# Patient Record
Sex: Female | Born: 1935 | Race: White | Hispanic: Yes | Smoking: Never smoker
Health system: Southern US, Community
[De-identification: ages and names within clinical notes are randomized; demographics above are authoritative.]

## PROBLEM LIST (undated history)

## (undated) DIAGNOSIS — L509 Urticaria, unspecified: Secondary | ICD-10-CM

## (undated) DIAGNOSIS — I10 Essential (primary) hypertension: Secondary | ICD-10-CM

## (undated) DIAGNOSIS — E7800 Pure hypercholesterolemia, unspecified: Secondary | ICD-10-CM

## (undated) HISTORY — DX: Urticaria, unspecified: L50.9

## (undated) HISTORY — PX: CATARACT EXTRACTION: SUR2

---

## 2007-11-09 ENCOUNTER — Ambulatory Visit (HOSPITAL_COMMUNITY): Admission: RE | Admit: 2007-11-09 | Discharge: 2007-11-09 | Payer: Self-pay | Admitting: Pediatrics

## 2009-02-13 ENCOUNTER — Other Ambulatory Visit: Admission: RE | Admit: 2009-02-13 | Discharge: 2009-02-13 | Payer: Self-pay | Admitting: Pediatrics

## 2010-02-23 ENCOUNTER — Ambulatory Visit (HOSPITAL_COMMUNITY)
Admission: RE | Admit: 2010-02-23 | Discharge: 2010-02-23 | Payer: Self-pay | Source: Home / Self Care | Admitting: Family Medicine

## 2011-06-07 ENCOUNTER — Other Ambulatory Visit (HOSPITAL_COMMUNITY): Payer: Self-pay | Admitting: Pediatrics

## 2011-06-07 DIAGNOSIS — Z139 Encounter for screening, unspecified: Secondary | ICD-10-CM

## 2011-06-14 ENCOUNTER — Ambulatory Visit (HOSPITAL_COMMUNITY)
Admission: RE | Admit: 2011-06-14 | Discharge: 2011-06-14 | Disposition: A | Discharge status: Home / Self Care | Payer: Medicare Other | Source: Ambulatory Visit | Attending: Pediatrics | Admitting: Pediatrics

## 2011-06-14 DIAGNOSIS — Z1231 Encounter for screening mammogram for malignant neoplasm of breast: Secondary | ICD-10-CM | POA: Insufficient documentation

## 2011-06-14 DIAGNOSIS — Z139 Encounter for screening, unspecified: Secondary | ICD-10-CM

## 2012-10-04 ENCOUNTER — Other Ambulatory Visit (HOSPITAL_COMMUNITY): Payer: Self-pay | Admitting: Internal Medicine

## 2012-10-04 DIAGNOSIS — Z09 Encounter for follow-up examination after completed treatment for conditions other than malignant neoplasm: Secondary | ICD-10-CM

## 2012-10-09 ENCOUNTER — Ambulatory Visit (HOSPITAL_COMMUNITY)
Admission: RE | Admit: 2012-10-09 | Discharge: 2012-10-09 | Disposition: A | Discharge status: Home / Self Care | Payer: Medicare Other | Source: Ambulatory Visit | Attending: Internal Medicine | Admitting: Internal Medicine

## 2012-10-09 DIAGNOSIS — Z1231 Encounter for screening mammogram for malignant neoplasm of breast: Secondary | ICD-10-CM | POA: Insufficient documentation

## 2012-10-09 DIAGNOSIS — Z09 Encounter for follow-up examination after completed treatment for conditions other than malignant neoplasm: Secondary | ICD-10-CM

## 2013-10-05 ENCOUNTER — Other Ambulatory Visit (HOSPITAL_COMMUNITY): Payer: Self-pay | Admitting: Internal Medicine

## 2013-10-05 DIAGNOSIS — Z1231 Encounter for screening mammogram for malignant neoplasm of breast: Secondary | ICD-10-CM

## 2013-10-10 ENCOUNTER — Ambulatory Visit (HOSPITAL_COMMUNITY)
Admission: RE | Admit: 2013-10-10 | Discharge: 2013-10-10 | Disposition: A | Discharge status: Home / Self Care | Payer: Medicare Other | Source: Ambulatory Visit | Attending: Internal Medicine | Admitting: Internal Medicine

## 2013-10-10 DIAGNOSIS — Z1231 Encounter for screening mammogram for malignant neoplasm of breast: Secondary | ICD-10-CM | POA: Insufficient documentation

## 2014-05-01 DIAGNOSIS — I10 Essential (primary) hypertension: Secondary | ICD-10-CM | POA: Diagnosis not present

## 2014-05-01 DIAGNOSIS — E785 Hyperlipidemia, unspecified: Secondary | ICD-10-CM | POA: Diagnosis not present

## 2014-07-31 DIAGNOSIS — H9190 Unspecified hearing loss, unspecified ear: Secondary | ICD-10-CM | POA: Diagnosis not present

## 2014-07-31 DIAGNOSIS — E785 Hyperlipidemia, unspecified: Secondary | ICD-10-CM | POA: Diagnosis not present

## 2014-07-31 DIAGNOSIS — I10 Essential (primary) hypertension: Secondary | ICD-10-CM | POA: Diagnosis not present

## 2014-07-31 DIAGNOSIS — R7309 Other abnormal glucose: Secondary | ICD-10-CM | POA: Diagnosis not present

## 2014-09-09 ENCOUNTER — Other Ambulatory Visit (HOSPITAL_COMMUNITY): Payer: Self-pay | Admitting: Internal Medicine

## 2014-09-09 DIAGNOSIS — Z1231 Encounter for screening mammogram for malignant neoplasm of breast: Secondary | ICD-10-CM

## 2014-09-18 ENCOUNTER — Ambulatory Visit (HOSPITAL_COMMUNITY)
Admission: RE | Admit: 2014-09-18 | Discharge: 2014-09-18 | Disposition: A | Discharge status: Home / Self Care | Payer: Medicare Other | Source: Ambulatory Visit | Attending: Internal Medicine | Admitting: Internal Medicine

## 2014-09-18 DIAGNOSIS — Z1231 Encounter for screening mammogram for malignant neoplasm of breast: Secondary | ICD-10-CM | POA: Insufficient documentation

## 2014-10-30 DIAGNOSIS — I10 Essential (primary) hypertension: Secondary | ICD-10-CM | POA: Diagnosis not present

## 2014-10-30 DIAGNOSIS — E785 Hyperlipidemia, unspecified: Secondary | ICD-10-CM | POA: Diagnosis not present

## 2015-02-05 DIAGNOSIS — E785 Hyperlipidemia, unspecified: Secondary | ICD-10-CM | POA: Diagnosis not present

## 2015-02-05 DIAGNOSIS — I10 Essential (primary) hypertension: Secondary | ICD-10-CM | POA: Diagnosis not present

## 2015-05-14 ENCOUNTER — Other Ambulatory Visit (HOSPITAL_COMMUNITY): Payer: Self-pay | Admitting: Internal Medicine

## 2015-05-14 DIAGNOSIS — Z780 Asymptomatic menopausal state: Secondary | ICD-10-CM

## 2015-05-14 DIAGNOSIS — E785 Hyperlipidemia, unspecified: Secondary | ICD-10-CM | POA: Diagnosis not present

## 2015-05-14 DIAGNOSIS — I10 Essential (primary) hypertension: Secondary | ICD-10-CM | POA: Diagnosis not present

## 2015-05-14 DIAGNOSIS — M810 Age-related osteoporosis without current pathological fracture: Secondary | ICD-10-CM

## 2015-05-16 ENCOUNTER — Ambulatory Visit (HOSPITAL_COMMUNITY)
Admission: RE | Admit: 2015-05-16 | Discharge: 2015-05-16 | Disposition: A | Discharge status: Home / Self Care | Payer: Commercial Managed Care - HMO | Source: Ambulatory Visit | Attending: Internal Medicine | Admitting: Internal Medicine

## 2015-05-16 DIAGNOSIS — M810 Age-related osteoporosis without current pathological fracture: Secondary | ICD-10-CM

## 2015-05-16 DIAGNOSIS — M8580 Other specified disorders of bone density and structure, unspecified site: Secondary | ICD-10-CM | POA: Insufficient documentation

## 2015-05-16 DIAGNOSIS — Z780 Asymptomatic menopausal state: Secondary | ICD-10-CM | POA: Insufficient documentation

## 2015-05-16 DIAGNOSIS — Z13820 Encounter for screening for osteoporosis: Secondary | ICD-10-CM | POA: Diagnosis not present

## 2015-05-16 DIAGNOSIS — M8588 Other specified disorders of bone density and structure, other site: Secondary | ICD-10-CM | POA: Diagnosis not present

## 2015-08-12 DIAGNOSIS — H2510 Age-related nuclear cataract, unspecified eye: Secondary | ICD-10-CM | POA: Diagnosis not present

## 2015-08-12 DIAGNOSIS — H5210 Myopia, unspecified eye: Secondary | ICD-10-CM | POA: Diagnosis not present

## 2015-08-12 DIAGNOSIS — I10 Essential (primary) hypertension: Secondary | ICD-10-CM | POA: Diagnosis not present

## 2015-08-12 DIAGNOSIS — H5200 Hypermetropia, unspecified eye: Secondary | ICD-10-CM | POA: Diagnosis not present

## 2015-08-13 DIAGNOSIS — I10 Essential (primary) hypertension: Secondary | ICD-10-CM | POA: Diagnosis not present

## 2015-08-13 DIAGNOSIS — M8580 Other specified disorders of bone density and structure, unspecified site: Secondary | ICD-10-CM | POA: Diagnosis not present

## 2015-08-13 DIAGNOSIS — E785 Hyperlipidemia, unspecified: Secondary | ICD-10-CM | POA: Diagnosis not present

## 2015-08-13 DIAGNOSIS — J029 Acute pharyngitis, unspecified: Secondary | ICD-10-CM | POA: Diagnosis not present

## 2015-11-14 DIAGNOSIS — M8580 Other specified disorders of bone density and structure, unspecified site: Secondary | ICD-10-CM | POA: Diagnosis not present

## 2015-11-14 DIAGNOSIS — E785 Hyperlipidemia, unspecified: Secondary | ICD-10-CM | POA: Diagnosis not present

## 2015-11-14 DIAGNOSIS — I10 Essential (primary) hypertension: Secondary | ICD-10-CM | POA: Diagnosis not present

## 2016-01-08 DIAGNOSIS — R6889 Other general symptoms and signs: Secondary | ICD-10-CM | POA: Diagnosis not present

## 2016-02-26 DIAGNOSIS — E785 Hyperlipidemia, unspecified: Secondary | ICD-10-CM | POA: Diagnosis not present

## 2016-02-26 DIAGNOSIS — I10 Essential (primary) hypertension: Secondary | ICD-10-CM | POA: Diagnosis not present

## 2016-02-26 DIAGNOSIS — H9192 Unspecified hearing loss, left ear: Secondary | ICD-10-CM | POA: Diagnosis not present

## 2016-02-26 DIAGNOSIS — M8580 Other specified disorders of bone density and structure, unspecified site: Secondary | ICD-10-CM | POA: Diagnosis not present

## 2016-05-26 DIAGNOSIS — M8580 Other specified disorders of bone density and structure, unspecified site: Secondary | ICD-10-CM | POA: Diagnosis not present

## 2016-05-26 DIAGNOSIS — E785 Hyperlipidemia, unspecified: Secondary | ICD-10-CM | POA: Diagnosis not present

## 2016-05-26 DIAGNOSIS — I10 Essential (primary) hypertension: Secondary | ICD-10-CM | POA: Diagnosis not present

## 2016-07-13 DIAGNOSIS — R6889 Other general symptoms and signs: Secondary | ICD-10-CM | POA: Diagnosis not present

## 2016-08-14 ENCOUNTER — Ambulatory Visit (INDEPENDENT_AMBULATORY_CARE_PROVIDER_SITE_OTHER): Payer: Medicare Other | Admitting: Otolaryngology

## 2016-08-16 ENCOUNTER — Ambulatory Visit (INDEPENDENT_AMBULATORY_CARE_PROVIDER_SITE_OTHER): Payer: Medicare HMO | Admitting: Otolaryngology

## 2016-08-16 DIAGNOSIS — H903 Sensorineural hearing loss, bilateral: Secondary | ICD-10-CM | POA: Diagnosis not present

## 2016-08-16 DIAGNOSIS — J310 Chronic rhinitis: Secondary | ICD-10-CM | POA: Diagnosis not present

## 2016-08-16 DIAGNOSIS — H9222 Otorrhagia, left ear: Secondary | ICD-10-CM | POA: Diagnosis not present

## 2016-08-24 ENCOUNTER — Other Ambulatory Visit (INDEPENDENT_AMBULATORY_CARE_PROVIDER_SITE_OTHER): Payer: Self-pay | Admitting: Otolaryngology

## 2016-08-25 ENCOUNTER — Other Ambulatory Visit (INDEPENDENT_AMBULATORY_CARE_PROVIDER_SITE_OTHER): Payer: Self-pay | Admitting: Otolaryngology

## 2016-08-25 DIAGNOSIS — IMO0001 Reserved for inherently not codable concepts without codable children: Secondary | ICD-10-CM

## 2016-08-25 DIAGNOSIS — H9042 Sensorineural hearing loss, unilateral, left ear, with unrestricted hearing on the contralateral side: Secondary | ICD-10-CM

## 2016-08-26 DIAGNOSIS — I10 Essential (primary) hypertension: Secondary | ICD-10-CM | POA: Diagnosis not present

## 2016-08-26 DIAGNOSIS — E785 Hyperlipidemia, unspecified: Secondary | ICD-10-CM | POA: Diagnosis not present

## 2016-08-26 DIAGNOSIS — M8580 Other specified disorders of bone density and structure, unspecified site: Secondary | ICD-10-CM | POA: Diagnosis not present

## 2016-10-07 DIAGNOSIS — R6889 Other general symptoms and signs: Secondary | ICD-10-CM | POA: Diagnosis not present

## 2016-12-07 DIAGNOSIS — M8580 Other specified disorders of bone density and structure, unspecified site: Secondary | ICD-10-CM | POA: Diagnosis not present

## 2016-12-07 DIAGNOSIS — I10 Essential (primary) hypertension: Secondary | ICD-10-CM | POA: Diagnosis not present

## 2016-12-07 DIAGNOSIS — Z0000 Encounter for general adult medical examination without abnormal findings: Secondary | ICD-10-CM | POA: Diagnosis not present

## 2016-12-07 DIAGNOSIS — R739 Hyperglycemia, unspecified: Secondary | ICD-10-CM | POA: Diagnosis not present

## 2016-12-07 DIAGNOSIS — E785 Hyperlipidemia, unspecified: Secondary | ICD-10-CM | POA: Diagnosis not present

## 2016-12-07 DIAGNOSIS — E619 Deficiency of nutrient element, unspecified: Secondary | ICD-10-CM | POA: Diagnosis not present

## 2016-12-16 DIAGNOSIS — R6889 Other general symptoms and signs: Secondary | ICD-10-CM | POA: Diagnosis not present

## 2016-12-30 DIAGNOSIS — R6889 Other general symptoms and signs: Secondary | ICD-10-CM | POA: Diagnosis not present

## 2017-03-09 DIAGNOSIS — E785 Hyperlipidemia, unspecified: Secondary | ICD-10-CM | POA: Diagnosis not present

## 2017-03-09 DIAGNOSIS — I10 Essential (primary) hypertension: Secondary | ICD-10-CM | POA: Diagnosis not present

## 2017-04-26 DIAGNOSIS — R6889 Other general symptoms and signs: Secondary | ICD-10-CM | POA: Diagnosis not present

## 2017-05-24 DIAGNOSIS — R6889 Other general symptoms and signs: Secondary | ICD-10-CM | POA: Diagnosis not present

## 2017-05-31 DIAGNOSIS — R6889 Other general symptoms and signs: Secondary | ICD-10-CM | POA: Diagnosis not present

## 2017-06-15 DIAGNOSIS — H9190 Unspecified hearing loss, unspecified ear: Secondary | ICD-10-CM | POA: Diagnosis not present

## 2017-06-15 DIAGNOSIS — E785 Hyperlipidemia, unspecified: Secondary | ICD-10-CM | POA: Diagnosis not present

## 2017-06-15 DIAGNOSIS — M8580 Other specified disorders of bone density and structure, unspecified site: Secondary | ICD-10-CM | POA: Diagnosis not present

## 2017-06-15 DIAGNOSIS — I10 Essential (primary) hypertension: Secondary | ICD-10-CM | POA: Diagnosis not present

## 2017-06-20 DIAGNOSIS — R6889 Other general symptoms and signs: Secondary | ICD-10-CM | POA: Diagnosis not present

## 2017-06-21 DIAGNOSIS — R6889 Other general symptoms and signs: Secondary | ICD-10-CM | POA: Diagnosis not present

## 2017-09-14 DIAGNOSIS — E785 Hyperlipidemia, unspecified: Secondary | ICD-10-CM | POA: Diagnosis not present

## 2017-09-14 DIAGNOSIS — R21 Rash and other nonspecific skin eruption: Secondary | ICD-10-CM | POA: Diagnosis not present

## 2017-09-14 DIAGNOSIS — M8580 Other specified disorders of bone density and structure, unspecified site: Secondary | ICD-10-CM | POA: Diagnosis not present

## 2017-09-14 DIAGNOSIS — I10 Essential (primary) hypertension: Secondary | ICD-10-CM | POA: Diagnosis not present

## 2017-12-12 DIAGNOSIS — I10 Essential (primary) hypertension: Secondary | ICD-10-CM | POA: Diagnosis not present

## 2017-12-12 DIAGNOSIS — E785 Hyperlipidemia, unspecified: Secondary | ICD-10-CM | POA: Diagnosis not present

## 2017-12-12 DIAGNOSIS — M8580 Other specified disorders of bone density and structure, unspecified site: Secondary | ICD-10-CM | POA: Diagnosis not present

## 2017-12-12 DIAGNOSIS — Z1389 Encounter for screening for other disorder: Secondary | ICD-10-CM | POA: Diagnosis not present

## 2017-12-12 DIAGNOSIS — Z0000 Encounter for general adult medical examination without abnormal findings: Secondary | ICD-10-CM | POA: Diagnosis not present

## 2017-12-12 DIAGNOSIS — Z0001 Encounter for general adult medical examination with abnormal findings: Secondary | ICD-10-CM | POA: Diagnosis not present

## 2017-12-12 DIAGNOSIS — Z1331 Encounter for screening for depression: Secondary | ICD-10-CM | POA: Diagnosis not present

## 2017-12-26 DIAGNOSIS — R6889 Other general symptoms and signs: Secondary | ICD-10-CM | POA: Diagnosis not present

## 2018-04-03 DIAGNOSIS — I10 Essential (primary) hypertension: Secondary | ICD-10-CM | POA: Diagnosis not present

## 2018-04-03 DIAGNOSIS — E785 Hyperlipidemia, unspecified: Secondary | ICD-10-CM | POA: Diagnosis not present

## 2018-04-03 DIAGNOSIS — M8580 Other specified disorders of bone density and structure, unspecified site: Secondary | ICD-10-CM | POA: Diagnosis not present

## 2018-04-14 DIAGNOSIS — R6889 Other general symptoms and signs: Secondary | ICD-10-CM | POA: Diagnosis not present

## 2018-06-12 DIAGNOSIS — E785 Hyperlipidemia, unspecified: Secondary | ICD-10-CM | POA: Diagnosis not present

## 2018-06-12 DIAGNOSIS — M8580 Other specified disorders of bone density and structure, unspecified site: Secondary | ICD-10-CM | POA: Diagnosis not present

## 2018-06-12 DIAGNOSIS — I10 Essential (primary) hypertension: Secondary | ICD-10-CM | POA: Diagnosis not present

## 2018-06-12 DIAGNOSIS — Z0000 Encounter for general adult medical examination without abnormal findings: Secondary | ICD-10-CM | POA: Diagnosis not present

## 2018-09-04 DIAGNOSIS — H5203 Hypermetropia, bilateral: Secondary | ICD-10-CM | POA: Diagnosis not present

## 2018-09-04 DIAGNOSIS — R6889 Other general symptoms and signs: Secondary | ICD-10-CM | POA: Diagnosis not present

## 2018-09-04 DIAGNOSIS — H2513 Age-related nuclear cataract, bilateral: Secondary | ICD-10-CM | POA: Diagnosis not present

## 2018-09-04 DIAGNOSIS — I10 Essential (primary) hypertension: Secondary | ICD-10-CM | POA: Diagnosis not present

## 2018-09-06 DIAGNOSIS — E785 Hyperlipidemia, unspecified: Secondary | ICD-10-CM | POA: Diagnosis not present

## 2018-09-12 DIAGNOSIS — H2513 Age-related nuclear cataract, bilateral: Secondary | ICD-10-CM | POA: Diagnosis not present

## 2018-09-12 DIAGNOSIS — H25043 Posterior subcapsular polar age-related cataract, bilateral: Secondary | ICD-10-CM | POA: Diagnosis not present

## 2018-09-12 DIAGNOSIS — H18413 Arcus senilis, bilateral: Secondary | ICD-10-CM | POA: Diagnosis not present

## 2018-09-12 DIAGNOSIS — H25013 Cortical age-related cataract, bilateral: Secondary | ICD-10-CM | POA: Diagnosis not present

## 2018-09-12 DIAGNOSIS — R6889 Other general symptoms and signs: Secondary | ICD-10-CM | POA: Diagnosis not present

## 2018-09-12 DIAGNOSIS — H40013 Open angle with borderline findings, low risk, bilateral: Secondary | ICD-10-CM | POA: Diagnosis not present

## 2018-09-12 DIAGNOSIS — H2511 Age-related nuclear cataract, right eye: Secondary | ICD-10-CM | POA: Diagnosis not present

## 2018-10-02 DIAGNOSIS — H2511 Age-related nuclear cataract, right eye: Secondary | ICD-10-CM | POA: Diagnosis not present

## 2018-10-03 DIAGNOSIS — H25012 Cortical age-related cataract, left eye: Secondary | ICD-10-CM | POA: Diagnosis not present

## 2018-10-03 DIAGNOSIS — H25042 Posterior subcapsular polar age-related cataract, left eye: Secondary | ICD-10-CM | POA: Diagnosis not present

## 2018-10-03 DIAGNOSIS — H2512 Age-related nuclear cataract, left eye: Secondary | ICD-10-CM | POA: Diagnosis not present

## 2018-10-09 DIAGNOSIS — R6889 Other general symptoms and signs: Secondary | ICD-10-CM | POA: Diagnosis not present

## 2018-10-23 DIAGNOSIS — R6889 Other general symptoms and signs: Secondary | ICD-10-CM | POA: Diagnosis not present

## 2018-11-06 DIAGNOSIS — H2512 Age-related nuclear cataract, left eye: Secondary | ICD-10-CM | POA: Diagnosis not present

## 2018-11-13 DIAGNOSIS — R6889 Other general symptoms and signs: Secondary | ICD-10-CM | POA: Diagnosis not present

## 2018-11-27 DIAGNOSIS — R6889 Other general symptoms and signs: Secondary | ICD-10-CM | POA: Diagnosis not present

## 2018-12-06 DIAGNOSIS — M8580 Other specified disorders of bone density and structure, unspecified site: Secondary | ICD-10-CM | POA: Diagnosis not present

## 2018-12-06 DIAGNOSIS — I10 Essential (primary) hypertension: Secondary | ICD-10-CM | POA: Diagnosis not present

## 2018-12-06 DIAGNOSIS — E1165 Type 2 diabetes mellitus with hyperglycemia: Secondary | ICD-10-CM | POA: Diagnosis not present

## 2018-12-06 DIAGNOSIS — Z0001 Encounter for general adult medical examination with abnormal findings: Secondary | ICD-10-CM | POA: Diagnosis not present

## 2018-12-06 DIAGNOSIS — Z1389 Encounter for screening for other disorder: Secondary | ICD-10-CM | POA: Diagnosis not present

## 2018-12-06 DIAGNOSIS — Z1331 Encounter for screening for depression: Secondary | ICD-10-CM | POA: Diagnosis not present

## 2018-12-06 DIAGNOSIS — E785 Hyperlipidemia, unspecified: Secondary | ICD-10-CM | POA: Diagnosis not present

## 2018-12-18 DIAGNOSIS — R6889 Other general symptoms and signs: Secondary | ICD-10-CM | POA: Diagnosis not present

## 2018-12-18 DIAGNOSIS — Z0100 Encounter for examination of eyes and vision without abnormal findings: Secondary | ICD-10-CM | POA: Diagnosis not present

## 2019-01-02 ENCOUNTER — Ambulatory Visit: Payer: Medicare HMO | Admitting: Family Medicine

## 2019-01-03 ENCOUNTER — Other Ambulatory Visit: Payer: Self-pay

## 2019-01-03 ENCOUNTER — Encounter (HOSPITAL_COMMUNITY): Payer: Self-pay | Admitting: Emergency Medicine

## 2019-01-03 ENCOUNTER — Emergency Department (HOSPITAL_COMMUNITY)
Admission: EM | Admit: 2019-01-03 | Discharge: 2019-01-03 | Disposition: A | Discharge status: Home / Self Care | Payer: Medicare HMO | Attending: Emergency Medicine | Admitting: Emergency Medicine

## 2019-01-03 DIAGNOSIS — I10 Essential (primary) hypertension: Secondary | ICD-10-CM | POA: Insufficient documentation

## 2019-01-03 DIAGNOSIS — R21 Rash and other nonspecific skin eruption: Secondary | ICD-10-CM | POA: Diagnosis not present

## 2019-01-03 DIAGNOSIS — R220 Localized swelling, mass and lump, head: Secondary | ICD-10-CM

## 2019-01-03 HISTORY — DX: Essential (primary) hypertension: I10

## 2019-01-03 HISTORY — DX: Pure hypercholesterolemia, unspecified: E78.00

## 2019-01-03 MED ORDER — PREDNISONE 20 MG PO TABS: ORAL_TABLET | ORAL | 0 refills | Status: DC

## 2019-01-03 MED ORDER — PREDNISONE 50 MG PO TABS
60.0000 mg | ORAL_TABLET | Freq: Once | ORAL | Status: AC
  Administered 2019-01-03: 60 mg via ORAL
  Filled 2019-01-03: qty 1

## 2019-01-03 NOTE — Discharge Instructions (Signed)
Continue to take Benadryl.  You are being prescribed prednisone to help with the swelling and itching.  You were given your first dose today, start the 4-day prescription tomorrow.  Stop Taking Losartan. Ask your primary care doctor which medicine he would like to prescribe instead  If you develop new or worsening swelling, trouble breathing or swallowing, or any other new/concerning symptoms then return to the ER for evaluation.

## 2019-01-03 NOTE — ED Provider Notes (Signed)
[facility] EMERGENCY DEPARTMENT Provider Note   CSN: [id number] Arrival date & time: 01/03/19  6213     History   Chief Complaint No chief complaint on file.   HPI Glenda Nguyen is a 84 y.o. female.     HPI  84 year old female presents with facial swelling and itching.  She states that overall this started on 10/3.  Mostly was itching with minimal swelling.  Most prominent under her eyes but also in her face and neck.  This morning she feels like her lower lip is swollen and the swelling is worse.  She is concerned it might be her blood pressure medicine, losartan.  She has been on this for several months.  She denies any trouble breathing, trouble swallowing, or change in voice.  She does not feel like her tongue is swollen.  No rash anywhere else.  Has taken some Benadryl though not in the last 24 hours and it seemed to be partially relieving.  Past Medical History:  Diagnosis Date  . High cholesterol   . Hypertension     There are no active problems to display for this patient.   Past Surgical History:  Procedure Laterality Date  . CATARACT EXTRACTION       OB History   No obstetric history on file.      Home Medications    Prior to Admission medications   Medication Sig Start Date End Date Taking? Authorizing Provider  predniSONE (DELTASONE) 20 MG tablet 2 tabs po daily x 4 days 01/04/19   Sherwood Gambler, MD    Family History No family history on file.  Social History Social History   Tobacco Use  . Smoking status: Never Smoker  . Smokeless tobacco: Never Used  Substance Use Topics  . Alcohol use: Never    Frequency: Never  . Drug use: Never     Allergies   Penicillins   Review of Systems Review of Systems  HENT: Positive for facial swelling. Negative for sore throat, trouble swallowing and voice change.   Respiratory: Negative for shortness of breath.   Gastrointestinal: Negative for vomiting.  Skin: Positive for rash.  All other  systems reviewed and are negative.    Physical Exam Updated Vital Signs BP (!) 157/85 (BP Location: Left Arm)   Pulse 69   Temp 98 F (36.7 C) (Oral)   Resp 18   Ht 5\' 8"  (1.727 m)   Wt 65.3 kg   SpO2 100%   BMI 21.90 kg/m   Physical Exam Vitals signs and nursing note reviewed.  Constitutional:      Appearance: She is well-developed.  HENT:     Head: Normocephalic and atraumatic.     Comments: Swelling with mild erythema to bilateral lower periorbital eyelids. No significant swelling of the lips seen. Fine rash noted to bilateral anterior neck. Tongue and oropharynx appear normal Normal phonation    Right Ear: External ear normal.     Left Ear: External ear normal.     Nose: Nose normal.  Eyes:     General:        Right eye: No discharge.        Left eye: No discharge.  Cardiovascular:     Rate and Rhythm: Normal rate and regular rhythm.     Heart sounds: Normal heart sounds.  Pulmonary:     Effort: Pulmonary effort is normal.     Breath sounds: Normal breath sounds. No stridor. No wheezing.  Abdominal:  Palpations: Abdomen is soft.     Tenderness: There is no abdominal tenderness.  Skin:    General: Skin is warm and dry.  Neurological:     Mental Status: She is alert.  Psychiatric:        Mood and Affect: Mood is not anxious.      ED Treatments / Results  Labs (all labs ordered are listed, but only abnormal results are displayed) Labs Reviewed - No data to display  EKG None  Radiology No results found.  Procedures Procedures (including critical care time)  Medications Ordered in ED Medications  predniSONE (DELTASONE) tablet 60 mg (has no administration in time range)     Initial Impression / Assessment and Plan / ED Course  I have reviewed the triage vital signs and the nursing notes.  Pertinent labs & imaging results that were available during my care of the patient were reviewed by me and considered in my medical decision making (see  chart for details).        Given the itching and swelling, this is likely allergic in nature.  No obvious new soaps or detergents or other cause.  Given the subjective lower lip swelling, with her being on an ARB, I will have her stop this and start her on a brief prednisone burst.  Advised her to follow-up with her PCP.  We discussed return precautions.  Continue using Benadryl.  This seems allergic, though if it does not improve one could consider other causes such as nephrotic syndrome but this seems unlikely at this time.  Final Clinical Impressions(s) / ED Diagnoses   Final diagnoses:  Facial swelling    ED Discharge Orders         Ordered    predniSONE (DELTASONE) 20 MG tablet     01/03/19 1136           Pricilla Loveless, MD 01/03/19 1207

## 2019-01-03 NOTE — ED Triage Notes (Signed)
Swelling around eyes and lips since yesterday.  Took benadryl yesterday and it got better.  Did not take any benadryl today.

## 2019-01-08 DIAGNOSIS — I10 Essential (primary) hypertension: Secondary | ICD-10-CM | POA: Diagnosis not present

## 2019-01-08 DIAGNOSIS — M8580 Other specified disorders of bone density and structure, unspecified site: Secondary | ICD-10-CM | POA: Diagnosis not present

## 2019-01-08 DIAGNOSIS — R21 Rash and other nonspecific skin eruption: Secondary | ICD-10-CM | POA: Diagnosis not present

## 2019-01-08 DIAGNOSIS — L299 Pruritus, unspecified: Secondary | ICD-10-CM | POA: Diagnosis not present

## 2019-01-08 DIAGNOSIS — E785 Hyperlipidemia, unspecified: Secondary | ICD-10-CM | POA: Diagnosis not present

## 2019-02-06 DIAGNOSIS — L299 Pruritus, unspecified: Secondary | ICD-10-CM | POA: Diagnosis not present

## 2019-02-06 DIAGNOSIS — I10 Essential (primary) hypertension: Secondary | ICD-10-CM | POA: Diagnosis not present

## 2019-02-06 DIAGNOSIS — E785 Hyperlipidemia, unspecified: Secondary | ICD-10-CM | POA: Diagnosis not present

## 2019-03-20 DIAGNOSIS — E785 Hyperlipidemia, unspecified: Secondary | ICD-10-CM | POA: Diagnosis not present

## 2019-03-20 DIAGNOSIS — I10 Essential (primary) hypertension: Secondary | ICD-10-CM | POA: Diagnosis not present

## 2019-03-27 DIAGNOSIS — L209 Atopic dermatitis, unspecified: Secondary | ICD-10-CM | POA: Diagnosis not present

## 2019-03-27 DIAGNOSIS — I10 Essential (primary) hypertension: Secondary | ICD-10-CM | POA: Diagnosis not present

## 2019-03-27 DIAGNOSIS — E785 Hyperlipidemia, unspecified: Secondary | ICD-10-CM | POA: Diagnosis not present

## 2019-04-16 DIAGNOSIS — L298 Other pruritus: Secondary | ICD-10-CM | POA: Diagnosis not present

## 2019-04-16 DIAGNOSIS — L821 Other seborrheic keratosis: Secondary | ICD-10-CM | POA: Diagnosis not present

## 2019-04-16 DIAGNOSIS — L820 Inflamed seborrheic keratosis: Secondary | ICD-10-CM | POA: Diagnosis not present

## 2019-06-06 DIAGNOSIS — R21 Rash and other nonspecific skin eruption: Secondary | ICD-10-CM | POA: Diagnosis not present

## 2019-06-06 DIAGNOSIS — E785 Hyperlipidemia, unspecified: Secondary | ICD-10-CM | POA: Diagnosis not present

## 2019-06-06 DIAGNOSIS — I10 Essential (primary) hypertension: Secondary | ICD-10-CM | POA: Diagnosis not present

## 2019-06-06 DIAGNOSIS — L299 Pruritus, unspecified: Secondary | ICD-10-CM | POA: Diagnosis not present

## 2019-06-06 DIAGNOSIS — L209 Atopic dermatitis, unspecified: Secondary | ICD-10-CM | POA: Diagnosis not present

## 2019-06-07 DIAGNOSIS — L258 Unspecified contact dermatitis due to other agents: Secondary | ICD-10-CM | POA: Diagnosis not present

## 2019-06-18 DIAGNOSIS — R21 Rash and other nonspecific skin eruption: Secondary | ICD-10-CM | POA: Diagnosis not present

## 2019-06-18 DIAGNOSIS — I10 Essential (primary) hypertension: Secondary | ICD-10-CM | POA: Diagnosis not present

## 2019-06-18 DIAGNOSIS — E785 Hyperlipidemia, unspecified: Secondary | ICD-10-CM | POA: Diagnosis not present

## 2019-06-18 DIAGNOSIS — L299 Pruritus, unspecified: Secondary | ICD-10-CM | POA: Diagnosis not present

## 2019-06-25 DIAGNOSIS — E785 Hyperlipidemia, unspecified: Secondary | ICD-10-CM | POA: Diagnosis not present

## 2019-06-25 DIAGNOSIS — I10 Essential (primary) hypertension: Secondary | ICD-10-CM | POA: Diagnosis not present

## 2019-06-25 DIAGNOSIS — L209 Atopic dermatitis, unspecified: Secondary | ICD-10-CM | POA: Diagnosis not present

## 2019-06-25 DIAGNOSIS — Z79899 Other long term (current) drug therapy: Secondary | ICD-10-CM | POA: Diagnosis not present

## 2019-08-15 ENCOUNTER — Other Ambulatory Visit: Payer: Self-pay

## 2019-08-15 ENCOUNTER — Encounter: Payer: Self-pay | Admitting: Allergy & Immunology

## 2019-08-15 ENCOUNTER — Ambulatory Visit: Payer: Medicare HMO | Admitting: Allergy & Immunology

## 2019-08-15 VITALS — BP 224/84 | HR 56 | Temp 98.2°F | Resp 18 | Ht 66.14 in | Wt 147.2 lb

## 2019-08-15 DIAGNOSIS — R21 Rash and other nonspecific skin eruption: Secondary | ICD-10-CM | POA: Diagnosis not present

## 2019-08-15 DIAGNOSIS — E7800 Pure hypercholesterolemia, unspecified: Secondary | ICD-10-CM | POA: Insufficient documentation

## 2019-08-15 DIAGNOSIS — K117 Disturbances of salivary secretion: Secondary | ICD-10-CM | POA: Diagnosis not present

## 2019-08-15 NOTE — Patient Instructions (Addendum)
1. Rash - We are going to get some blood work to look for allergies (food and environmental) today.  - We will get some labs to look for more serious causes of rashes, such as autoimmune causes.  - We are going to get your Dermatology records to see what they were thinking regarding your diagnosis. - Continue with moisturizing twice daily with Aveeno three times daily.  - Start Eucerin compounded with triamcinolone twice daily to the entire body to calm inflammation (do this twice daily for two weeks, once daily for two weeks, then three times weekly thereafter). - We will schedule you for patch testing to see if there is a chemical sensitivity you are experiencing. - TAKE pictures of your rash for [place] to look at next time.   2. Return in about 4 weeks (around 09/12/2019) for [facility] TESTING and then 8 weeks for a regllar follow up appointment.   Please inform us of any Emergency Department visits, hospitalizations, or changes in symptoms. Call us before going to the ED for breathing or allergy symptoms since we might be able to fit you in for a sick visit. Feel free to contact us anytime with any questions, problems, or concerns.  It was a pleasure to meet you today!  Websites that have reliable patient information: 1. American Academy of Asthma, Allergy, and Immunology: www.aaaai.org 2. Food Allergy Research and Education (FARE): foodallergy.org 3. Mothers of Asthmatics: http://www.asthmacommunitynetwork.org 4. American College of Allergy, Asthma, and Immunology: www.acaai.org   COVID-19 Vaccine Information can be found at: PodExchange.nl For questions related to vaccine distribution or appointments, please email [email or web address] or call [phone].     "Like" [place] on Facebook and Instagram for our latest updates!       HAPPY SPRING!  Make sure you are registered to vote! If you have moved or changed any of your  contact information, you will need to get this updated before voting!  In some cases, you MAY be able to register to vote online: [email or web address]     True Test looks for the following sensitivities:

## 2019-08-15 NOTE — Progress Notes (Signed)
NEW PATIENT  Date of Service/Encounter:  08/15/19  Referring provider: Marylynn Pearson, FNP   Assessment:   Rash  Xerostosis - ? Sjogren's syndrome   Glenda Nguyen presents for an evaluation of a rash.  It is rather unclear what her rash looks like or what it is caused by.  She has no pictures and the rash is not there today.  Her description of the rash is not very enlightening either.  We are going to get her dermatology notes to see what they thought of the rash.  We also are going to get some outside records to see what kind of work-up has been done.  We are going to order more extensive work-up today to see if this is related to environmental or food allergies.  We are going to start her on a combined triamcinolone with Eucerin aggressively for a couple weeks before weaning down to 3 times a week to control inflammation.  I think that patch testing will be useful as well we are to schedule that at her earliest convenience.  Hopefully between all of this, we can figure out what is going on.  Plan/Recommendations:   1. Rash - We are going to get some blood work to look for allergies (food and environmental) today.  - We will get some labs to look for more serious causes of rashes, such as autoimmune causes.  - We are going to get your Dermatology records to see what they were thinking regarding your diagnosis. - Continue with moisturizing twice daily with Aveeno three times daily.  - Start Eucerin compounded with triamcinolone twice daily to the entire body to calm inflammation (do this twice daily for two weeks, once daily for two weeks, then three times weekly thereafter). - We will schedule you for patch testing to see if there is a chemical sensitivity you are experiencing. - TAKE pictures of your rash for [place] to look at next time.   2. Return in about 4 weeks (around 09/12/2019) for [facility] TESTING and then 8 weeks for a regular follow up appointment.    Subjective:   Glenda Nguyen is a 85 y.o. female presenting today for evaluation of  Chief Complaint  Patient presents with  . Rash    rash on face red and hot itching and hives all over body everyday after she takes medication, very thirsty    Glenda Nguyen has a history of the following: Patient Active Problem List   Diagnosis Date Noted  . Hypercholesterolemia 08/15/2019    History obtained from: chart review and patient.  Glenda Nguyen was referred by Marylynn Pearson, FNP.     Glenda Nguyen is a 85 y.o. female presenting for an evaluation of a rash.   She has a rash that starts in the morning. There is a lot of itching and burning. She reports bumps on her neck when the itching starts.  It is unclear what the rash looks like.  She does not have pictures and is not very good at describing it.  It has been going on for three years. She went to see a Dermatologist. The Dermatologist told her that she was dehydrated. She never had a biopsy performed. There is no worsening with any kind of food to her knowledge. Apparently blood work was negative for allergies although I am unclear what exactly was sent.  She does have a CMP today that was drawn in March 2021 that is essentially normal.  Her triglycerides were  elevated, but otherwise she was good.  She did have a meat allergy panel sent which was negative to pork, beef, and chicken.  Does not seem that other foods were sent.  Rash does not seem to get worse during a particular time of the year. She has been using a cream prescribed by the Dermatologist (triamcinolone). She is also on hydroxyzine to help with the itching. It does not make her sleepy. She is drinking a lot of water since she was told that she was dehydrated, so now she is getting up in the middle of the night to urinate 3-4 times per week. She is very thirsty with her tongue "sticking to [her] teeth".   Otherwise, there is no history of other atopic diseases, including asthma, drug  allergies, stinging insect allergies or contact dermatitis. There is no significant infectious history. Vaccinations are up to date.    Past Medical History: Patient Active Problem List   Diagnosis Date Noted  . Hypercholesterolemia 08/15/2019    Medication List:  Allergies as of 08/15/2019      Reactions   Penicillins Rash      Medication List       Accurate as of Aug 15, 2019 11:44 AM. If you have any questions, ask your nurse or doctor.        STOP taking these medications   predniSONE 20 MG tablet Commonly known as: DELTASONE Stopped by: Glenda Spruce, MD     TAKE these medications   b complex vitamins tablet Take 1 tablet by mouth daily.   carvedilol 3.125 MG tablet Commonly known as: COREG   CoQ10 100 MG Caps Take by mouth.   Fish Oil 1000 MG Caps Take by mouth.   glucosamine-chondroitin 500-400 MG tablet Take 1 tablet by mouth 3 (three) times daily.   hydrOXYzine 25 MG tablet Commonly known as: ATARAX/VISTARIL   triamcinolone cream 0.5 % Commonly known as: KENALOG Apply 1 application topically 3 (three) times daily.   Turmeric 500 MG Caps Take by mouth.       Birth History: non-contributory  Developmental History: non-contributory  Past Surgical History: Past Surgical History:  Procedure Laterality Date  . CATARACT EXTRACTION       Family History: Family History  Problem Relation Age of Onset  . Allergic rhinitis Neg Hx   . Asthma Neg Hx   . Eczema Neg Hx   . Urticaria Neg Hx      Social History: Glenda Nguyen lives in a house.  She is widowed.  Her husband passed away in the early 2000's.  There are rugs in the main living area and tiling in the bedroom.  They have electric heating and central cooling.  There are no animals inside or outside of the home.  There is no tobacco exposure.  She is currently retired.  She does use a HEPA filter in the home.  She does not live near an interstate or industrial area.   Review of Systems    Constitutional: Negative.  Negative for chills, fever, malaise/fatigue and weight loss.  HENT: Negative.  Negative for congestion, ear discharge, ear pain, nosebleeds, sinus pain and sore throat.   Eyes: Negative for pain, discharge and redness.  Respiratory: Negative for cough, sputum production, shortness of breath and wheezing.   Cardiovascular: Negative.  Negative for chest pain and palpitations.  Gastrointestinal: Negative for abdominal pain, constipation, diarrhea, heartburn, nausea and vomiting.  Skin: Positive for itching and rash.  Neurological: Negative for dizziness and headaches.  Endo/Heme/Allergies: Negative for environmental allergies. Does not bruise/bleed easily.       Objective:   Blood pressure (!) 224/84, pulse (!) 56, temperature 98.2 F (36.8 C), temperature source Temporal, resp. rate 18, height 5' 6.14" (1.68 m), weight 147 lb 3.2 oz (66.8 kg), SpO2 99 %. Body mass index is 23.66 kg/m.   Physical Exam:   Physical Exam  Constitutional: She appears well-developed.  HENT:  Head: Normocephalic and atraumatic.  Right Ear: Tympanic membrane, external ear and ear canal normal. No drainage, swelling or tenderness. Tympanic membrane is not injected, not scarred, not erythematous, not retracted and not bulging.  Left Ear: Tympanic membrane, external ear and ear canal normal. No drainage, swelling or tenderness. Tympanic membrane is not injected, not scarred, not erythematous, not retracted and not bulging.  Nose: No mucosal edema, rhinorrhea, nasal deformity or septal deviation. No epistaxis. Right sinus exhibits no maxillary sinus tenderness and no frontal sinus tenderness. Left sinus exhibits no maxillary sinus tenderness and no frontal sinus tenderness.  Mouth/Throat: Uvula is midline and oropharynx is clear and moist. Mucous membranes are not pale and not dry.  Eyes: Pupils are equal, round, and reactive to light. Conjunctivae and EOM are normal. Right eye exhibits  no chemosis and no discharge. Left eye exhibits no chemosis and no discharge. Right conjunctiva is not injected. Left conjunctiva is not injected.  Cardiovascular: Normal rate, regular rhythm and normal heart sounds.  Respiratory: Effort normal and breath sounds normal. No accessory muscle usage. No tachypnea. No respiratory distress. She has no wheezes. She has no rhonchi. She has no rales. She exhibits no tenderness.  GI: There is no abdominal tenderness. There is no rebound and no guarding.  Lymphadenopathy:       Head (right side): No submandibular, no tonsillar and no occipital adenopathy present.       Head (left side): No submandibular, no tonsillar and no occipital adenopathy present.    She has no cervical adenopathy.  Neurological: She is alert.  Skin: No abrasion, no petechiae and no rash noted. Rash is not papular, not vesicular and not urticarial. No erythema. No pallor.  No eczematous lesions noted.  She does have thin skin consistent with her age.  No urticaria appreciated.  Psychiatric: She has a normal mood and affect.     Diagnostic studies: labs sent instead         Salvatore Marvel, MD [facility]

## 2019-08-24 ENCOUNTER — Telehealth: Payer: Self-pay

## 2019-08-24 NOTE — Telephone Encounter (Signed)
Patient called regarding her medications from visit on 08-15-2019 not being sent. She requested after being told that they would be sent in today that we not send them. She said that she had found something else to use and it was working.

## 2019-08-25 LAB — IGE+ALLERGENS ZONE 2(30)
Alternaria Alternata IgE: <0.1 kU/L
Amer Sycamore IgE Qn: <0.1 kU/L
Aspergillus Fumigatus IgE: <0.1 kU/L
Bahia Grass IgE: 43.7 kU/L — AB
Bermuda Grass IgE: 6.82 kU/L — AB
Cat Dander IgE: <0.1 kU/L
Cedar, Mountain IgE: <0.1 kU/L
Cladosporium Herbarum IgE: <0.1 kU/L
Cockroach, American IgE: <0.1 kU/L
Common Silver Birch IgE: <0.1 kU/L
D Farinae IgE: <0.1 kU/L
D Pteronyssinus IgE: <0.1 kU/L
Dog Dander IgE: <0.1 kU/L
Elm, American IgE: <0.1 kU/L
Hickory, White IgE: 0.32 kU/L — AB
IgE (Immunoglobulin E), Serum: 568 IU/mL — ABNORMAL HIGH (ref 6–495)
Johnson Grass IgE: 11.5 kU/L — AB
Maple/Box Elder IgE: <0.1 kU/L
Mucor Racemosus IgE: <0.1 kU/L
Mugwort IgE Qn: <0.1 kU/L
Nettle IgE: <0.1 kU/L
Oak, White IgE: <0.1 kU/L
Penicillium Chrysogen IgE: <0.1 kU/L
Pigweed, Rough IgE: <0.1 kU/L
Plantain, English IgE: <0.1 kU/L
Ragweed, Short IgE: <0.1 kU/L
Sheep Sorrel IgE Qn: <0.1 kU/L
Stemphylium Herbarum IgE: <0.1 kU/L
Sweet gum IgE RAST Ql: <0.1 kU/L
Timothy Grass IgE: 20.9 kU/L — AB
White Mulberry IgE: <0.1 kU/L

## 2019-08-25 LAB — ALLERGEN PROFILE, BASIC FOOD
Allergen Corn, IgE: <0.1 kU/L
Beef IgE: <0.1 kU/L
Chocolate/Cacao IgE: <0.1 kU/L
Egg, Whole IgE: <0.1 kU/L
Food Mix (Seafoods) IgE: NEGATIVE
Milk IgE: <0.1 kU/L
Peanut IgE: <0.1 kU/L
Pork IgE: <0.1 kU/L
Soybean IgE: <0.1 kU/L
Wheat IgE: <0.1 kU/L

## 2019-08-25 LAB — CMP14+EGFR
ALT: 27 IU/L (ref 0–32)
AST: 33 IU/L (ref 0–40)
Albumin/Globulin Ratio: 1.6 (ref 1.2–2.2)
Albumin: 4.7 g/dL — ABNORMAL HIGH (ref 3.6–4.6)
Alkaline Phosphatase: 74 IU/L (ref 48–121)
BUN/Creatinine Ratio: 18 (ref 12–28)
BUN: 19 mg/dL (ref 8–27)
Bilirubin Total: 0.4 mg/dL (ref 0.0–1.2)
CO2: 22 mmol/L (ref 20–29)
Calcium: 9.9 mg/dL (ref 8.7–10.3)
Chloride: 94 mmol/L — ABNORMAL LOW (ref 96–106)
Creatinine, Ser: 1.06 mg/dL — ABNORMAL HIGH (ref 0.57–1.00)
GFR calc Af Amer: 57 mL/min/{1.73_m2} — ABNORMAL LOW (ref >59)
GFR calc non Af Amer: 49 mL/min/{1.73_m2} — ABNORMAL LOW (ref >59)
Globulin, Total: 2.9 g/dL (ref 1.5–4.5)
Glucose: 87 mg/dL (ref 65–99)
Potassium: 4.9 mmol/L (ref 3.5–5.2)
Sodium: 133 mmol/L — ABNORMAL LOW (ref 134–144)
Total Protein: 7.6 g/dL (ref 6.0–8.5)

## 2019-08-25 LAB — TRYPTASE: Tryptase: 10.1 ug/L (ref 2.2–13.2)

## 2019-08-25 LAB — ALPHA-GAL PANEL
Alpha Gal IgE*: <0.1 kU/L (ref <0.10)
Beef (Bos spp) IgE: <0.1 kU/L (ref <0.35)
Class Interpretation: 0
Class Interpretation: 0
Class Interpretation: 0
Lamb/Mutton (Ovis spp) IgE: <0.1 kU/L (ref <0.35)
Pork (Sus spp) IgE: <0.1 kU/L (ref <0.35)

## 2019-08-25 LAB — ANA W/REFLEX IF POSITIVE: Anti Nuclear Antibody (ANA): NEGATIVE

## 2019-08-25 LAB — SEDIMENTATION RATE: Sed Rate: 13 mm/hr (ref 0–40)

## 2019-08-25 LAB — C-REACTIVE PROTEIN: CRP: <1 mg/L (ref 0–10)

## 2019-09-03 ENCOUNTER — Ambulatory Visit: Payer: Medicare HMO | Admitting: Family Medicine

## 2019-09-09 DIAGNOSIS — L239 Allergic contact dermatitis, unspecified cause: Secondary | ICD-10-CM | POA: Insufficient documentation

## 2019-09-09 NOTE — Progress Notes (Signed)
   Follow Up Note  RE: [name] MRN: [id number] DOB: 1935-01-31 Date of Office Visit: 09/10/2019  Referring provider: Marylynn Pearson, FNP Primary care provider: Marylynn Pearson, FNP  History of Present Illness: I had the pleasure of seeing Glenda Nguyen for a follow up visit at the [facility] on 09/10/2019. She is a 85 y.o. female, who is being followed for rash. Today she is here for patch test placement, given suspected history of contact dermatitis.   Diagnostics: TRUE Test patches placed.   Assessment and Plan: Glenda Nguyen is a 85 y.o. female with: Allergic contact dermatitis TRUE patches placed today.    The patient was instructed regarding proper care of the patches for the next 48 hours. Do not get patches wet - avoid showering until the next visit. Do not engage in vigorous physical activity.  Patient will follow up in 48 hours and 96 hours for patch readings.  It was my pleasure to see [name] today and participate in her care. Please feel free to contact me with any questions or concerns.  Sincerely,  Wyline Mood, DO Allergy & Immunology  [facility] office: [phone] [facility] office: [phone] [place] office: [phone]

## 2019-09-10 ENCOUNTER — Other Ambulatory Visit: Payer: Self-pay

## 2019-09-10 ENCOUNTER — Ambulatory Visit: Payer: Medicare HMO | Admitting: Allergy

## 2019-09-10 ENCOUNTER — Encounter: Payer: Self-pay | Admitting: Allergy

## 2019-09-10 VITALS — BP 150/92 | HR 63 | Temp 98.3°F | Resp 16

## 2019-09-10 DIAGNOSIS — L239 Allergic contact dermatitis, unspecified cause: Secondary | ICD-10-CM | POA: Diagnosis not present

## 2019-09-10 NOTE — Patient Instructions (Signed)
Patches placed today. Please avoid strenuous physical activities and do not get the patches on the back wet. No showering until final patch reading done. Okay to take antihistamines for itching but avoid placing any creams on the back where the patches are. We will remove the patches on Wednesday and will do our initial read. Then you will come back on Friday for a final read. 

## 2019-09-10 NOTE — Assessment & Plan Note (Signed)
TRUE patches placed today. 

## 2019-09-12 ENCOUNTER — Ambulatory Visit: Payer: Medicare HMO | Admitting: Allergy

## 2019-09-12 ENCOUNTER — Other Ambulatory Visit: Payer: Self-pay

## 2019-09-12 ENCOUNTER — Ambulatory Visit: Payer: Medicare HMO | Admitting: Family

## 2019-09-12 ENCOUNTER — Encounter: Payer: Medicare HMO | Admitting: Allergy

## 2019-09-12 ENCOUNTER — Encounter: Payer: Self-pay | Admitting: Family

## 2019-09-12 DIAGNOSIS — L239 Allergic contact dermatitis, unspecified cause: Secondary | ICD-10-CM

## 2019-09-12 NOTE — Progress Notes (Signed)
Glenda Nguyen returns to the office today for the 48 hour patch test interpretation, given suspected history of contact dermatitis.    Diagnostics:   TRUE TEST 48-hour hour reading: negative reaction to #2 (Wool Alcohols), negative reaction to #3 (Neomycin sulfate), negative reaction to #4 (Potassium dichromate), negative reaction to #5 (Caine mix), slight reaction to  #6 (Fragrance mix), negative reaction to #7 (Colophony), negative reaction to #9 (Paraben mix), negative reaction to #10 (Balsam of [place]), negative reaction to #11 (Ethylenediamine dihydrochloride), negative reaction to #12 (Cobalt chloride), negative reaction to #13 (p-tert Butylphenol formaldehyde resin), negative reaction to #14 (Epoxy resin), negative reaction to #15 (Carba mix), negative reaction to #16 (Black rubber mix), negative reaction to #17 (Cl+Me-Isothiazolinone), negative reaction to #18 (Quaternium-15), negative reaction to #19 (Methyldibromo Glutaronitrile), negative reaction to #20 (p-Phenylene-diamine), negative reaction to #21 (Formaldehyde), negative reaction to #22 (Mercapto mix), negative reaction to #23 (Thiomersal), negative reaction to #24 (Thiuram mix), negative reaction to #25 (Diazolidinyl urea), negatie reaction to #26 (Quinoline mix), negative reaction to #27 (Tixocortol-21-pivalate), negative reaction to #28 (Gold sodium thiosulfate), negative reaction to #29 (Imidazolidinyl urea), negative reaction to #30 (Budesonide), negative reaction to #31 (Hydrocortisone-17-butyrate), negative reaction to #32 (Mercapto-benzothiazole), negative reaction to #33 (Bacitracin), negative reaction to #34 (Parthenolide), negative reaction to #35 (Disperse blue) and negative reaction to #36 (Bronopol)  Plan:   Allergic contact dermatitis - The patient has been provided detailed information regarding the substances she is sensitive to, as well as products containing the substances.   - Meticulous avoidance of these substances is  recommended.  - If avoidance is not possible, the use of barrier creams or lotions is recommended. - If symptoms persist or progress despite meticulous avoidance of Fragrance Mix, Dermatology Referral may be warranted.  Thank you for the opportunity to care for this patient. Please do not hesitate to call me with any questions.  Nehemiah Settle, FNP [facility]

## 2019-09-14 ENCOUNTER — Encounter: Payer: Self-pay | Admitting: Family

## 2019-09-14 ENCOUNTER — Ambulatory Visit: Payer: Medicare HMO | Admitting: Family

## 2019-09-14 ENCOUNTER — Other Ambulatory Visit: Payer: Self-pay

## 2019-09-14 ENCOUNTER — Encounter: Payer: Medicare HMO | Admitting: Allergy

## 2019-09-14 DIAGNOSIS — L239 Allergic contact dermatitis, unspecified cause: Secondary | ICD-10-CM

## 2019-09-14 NOTE — Progress Notes (Signed)
Glenda Nguyen returns to the office today for the final patch test interpretation, given suspected history of contact dermatitis. She reports that she has had no more itching since she has changed her detergent and lotion. She is now using Aveeno lotion and body wash.   Diagnostics:   TRUE TEST 96-hour hour reading: negative reaction to #2 (Wool Alcohols), negative reaction to #3 (Neomycin sulfate), negative reaction to #4 (Potassium dichromate), negative reaction to #5 (Caine mix), slight reaction to  #6 (Fragrance mix), negative reaction to #7 (Colophony), negative reaction to #9 (Paraben mix), negative reaction to #10 (Balsam of [place]), negatvie reaction to #11 (Ethylenediamine dihydrochloride), negative reaction to #12 (Cobalt chloride), negative reaction to #13 (p-tert Butylphenol formaldehyde resin), negative reaction to #14 (Epoxy resin), negative reaction to #15 (Carba mix), negative reaction to #16 (Black rubber mix), negative reaction to #17 (Cl+Me-Isothiazolinone), negative reaction to #18 (Quaternium-15), negative reaction to #19 (Methyldibromo Glutaronitrile), negative reaction to #20 (p-Phenylene-diamine), negative reaction to #21 (Formaldehyde), negative reaction to #22 (Mercapto mix), negative reaction to #23 (Thiomersal), negative reaction to #24 (Thiuram mix), negative reaction to #25 (Diazolidinyl urea), negative reaction to #26 (Quinoline mix), negative reaction to #27 (Tixocortol-21-pivalate), negative reaction to #28 (Gold sodium thiosulfate), negative reaction to #29 (Imidazolidinyl urea), negative reaction to #30 (Budesonide), negative reaction to #31 (Hydrocortisone-17-butyrate), negative reaction to #32 (Mercapto-benzothiazole), negative reaction to #33 (Bacitracin), negative reaction to #34 (Parthenolide), negative reaction to #35 (Disperse blue) and negative reaction to #36 (Bronopol)  Plan:   Allergic contact dermatitis - The patient has been provided detailed information regarding  the substances she is sensitive to, as well as products containing the substances.   - Meticulous avoidance of these substances is recommended.  - If avoidance is not possible, the use of barrier creams or lotions is recommended. - If symptoms persist or progress despite meticulous avoidance of fragrance mix, Dermatology Referral may be warranted.

## 2019-12-19 ENCOUNTER — Ambulatory Visit: Payer: Medicare HMO | Admitting: Allergy & Immunology

## 2021-06-24 ENCOUNTER — Ambulatory Visit: Payer: Medicare HMO | Admitting: Rheumatology

## 2021-07-21 ENCOUNTER — Ambulatory Visit: Payer: Medicare HMO | Admitting: Rheumatology

## 2023-02-10 ENCOUNTER — Other Ambulatory Visit (HOSPITAL_COMMUNITY): Payer: Self-pay | Admitting: Adult Health

## 2023-02-10 DIAGNOSIS — M79641 Pain in right hand: Secondary | ICD-10-CM

## 2023-10-26 ENCOUNTER — Other Ambulatory Visit (HOSPITAL_COMMUNITY): Payer: Self-pay | Admitting: Adult Health

## 2023-10-26 DIAGNOSIS — M79641 Pain in right hand: Secondary | ICD-10-CM

## 2023-12-28 ENCOUNTER — Ambulatory Visit: Admitting: Orthopedic Surgery

## 2024-01-06 ENCOUNTER — Other Ambulatory Visit (INDEPENDENT_AMBULATORY_CARE_PROVIDER_SITE_OTHER): Payer: Self-pay

## 2024-01-06 ENCOUNTER — Encounter: Payer: Self-pay | Admitting: Orthopedic Surgery

## 2024-01-06 ENCOUNTER — Ambulatory Visit: Admitting: Orthopedic Surgery

## 2024-01-06 VITALS — BP 142/90 | HR 89 | Ht 66.0 in | Wt 142.0 lb

## 2024-01-06 DIAGNOSIS — M152 Bouchard's nodes (with arthropathy): Secondary | ICD-10-CM | POA: Diagnosis not present

## 2024-01-06 DIAGNOSIS — M25531 Pain in right wrist: Secondary | ICD-10-CM | POA: Diagnosis not present

## 2024-01-06 DIAGNOSIS — M79641 Pain in right hand: Secondary | ICD-10-CM

## 2024-01-06 DIAGNOSIS — M25539 Pain in unspecified wrist: Secondary | ICD-10-CM

## 2024-01-06 NOTE — Progress Notes (Signed)
 New Patient Visit  Assessment: Glenda Nguyen is a 89 y.o. female with the following: 1. Pain of ulnar side of wrist 2. 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  She had a recent fall, which has led to persistent ulnar-sided wrist pain.  Radiographs are negative for acute injury in this area.  Could consider an injection if the pain persists.  She also has severe degenerative changes of the PIP joints of the right long finger.  On exam, she has some stiffness, as well as swelling in this area.  No redness or other signs of an infection at this time.  Depending on how severe the pain is, we can consider a referral to a hand specialist.  She states understanding.  She has some questions for her insurance company.  She would like to ensure that this potentially could be covered.  If she would like to be evaluated by hand specialist, she will contact the clinic.  Otherwise continue with activities as tolerated.  Medicines as needed.  Follow-up: Return if symptoms worsen or fail to improve.  Subjective:  Chief Complaint  Patient presents with   Hand Pain    R hand after fall end of Jul '25. Pt did have x-rays done 2 wks after fall but she's still having pain     History of Present Illness: Glenda Nguyen is a 89 y.o. female who has been referred by  Kalombo Nsumanganyi, NP for evaluation of right hand pain.  She is right-hand dominant.  She notes that she fell and landed on her right arm 1-2 months ago.  She was evaluated elsewhere, and radiographs were concerning for potential injury to the distal radius.  She continues to have pain in the ulnar side of the wrist.  She is also complaining of pain in the long finger.  The right long finger has been an issue for longer.  No specific injury.  She does not note any issues with redness or drainage around the index finger.   She is fairly healthy otherwise.  She remains active.  She likes to work in her garden   Review of Systems: No fevers or chills No numbness or tingling No chest pain No shortness of breath No bowel or bladder dysfunction No GI distress No headaches   Medical History:  Past Medical History:  Diagnosis Date   High cholesterol    Hypertension    Urticaria     Past Surgical History:  Procedure Laterality Date   CATARACT EXTRACTION      Family History  Problem Relation Age of Onset   Allergic rhinitis Neg Hx    Asthma Neg Hx    Eczema Neg Hx    Urticaria Neg Hx    Social History   Tobacco Use   Smoking status: Never   Smokeless tobacco: Never  Vaping Use   Vaping status: Never Used  Substance Use Topics   Alcohol  use: Never   Drug use: Never    Allergies  Allergen Reactions   Penicillins Rash    Current Meds  Medication Sig   b complex vitamins tablet Take 1 tablet by mouth daily.   carvedilol (COREG) 3.125 MG tablet    Coenzyme Q10 (COQ10) 100 MG CAPS Take by mouth.   glucosamine-chondroitin 500-400 MG tablet Take 1 tablet by mouth 3 (three) times daily.   hydrOXYzine (ATARAX/VISTARIL) 25 MG tablet  Omega-3 Fatty Acids (FISH OIL) 1000 MG CAPS Take by mouth.   triamcinolone cream (KENALOG) 0.5 % Apply 1 application topically 3 (three) times daily.   Turmeric 500 MG CAPS Take by mouth.    Objective: BP (!) 142/90   Pulse 89   Ht 5' 6 (1.676 m)   Wt 142 lb (64.4 kg)   BMI 22.92 kg/m   Physical Exam:  General: Alert and oriented. and No acute distress. Gait: Normal gait.  Evaluation of the right hand demonstrates swelling to the right long finger.  This is isolated to the PIP joint.  There is some stiffness in this area.  Minimal tenderness to palpation.  No redness.  No fluctuance.  She also has tenderness within the area of the TFCC.  Pain is not necessarily worsened with ulnar deviation.  She describes pain and has tenderness on the dorsum of  the ulnar hand, as well as the volar aspect of the ulnar hand.  No tenderness over the distal radius.  No redness.  Minimal swelling otherwise.  IMAGING: I personally ordered and reviewed the following images  X-rays of the right hand were obtained in clinic today.  These are compared to x-rays that were obtained previously.  No acute injury within the distal radius.  Ulnar styloid is intact.  At the PIP joint of the long finger, there is extensive degenerative changes with large cystic changes in both the proximal phalanx as well as the middle phalanx.  There is soft tissue swelling in this area.  No additional injuries.  No bony lesions.  Impression: Right hand x-rays with extensive degenerative changes noted at the long finger PIP joint   New Medications:  No orders of the defined types were placed in this encounter.     Oneil DELENA Horde, MD  01/06/2024 11:06 AM

## 2024-01-06 NOTE — Patient Instructions (Signed)
 Please contact the clinic if you are interested in a referral to a hand specialist  Follow-up as needed

## 2024-01-26 ENCOUNTER — Telehealth: Payer: Self-pay | Admitting: Orthopedic Surgery

## 2024-01-26 DIAGNOSIS — M152 Bouchard's nodes (with arthropathy): Secondary | ICD-10-CM

## 2024-01-26 DIAGNOSIS — M25539 Pain in unspecified wrist: Secondary | ICD-10-CM

## 2024-01-26 NOTE — Telephone Encounter (Signed)
 Dr. Onesimo pt - Rachal w/[employer] [phone] lvm stating this patient was recently seen and advised she'd need to see a hand specialist.  She is requesting the referral for this.  She stated you can call [employer] if you have any questions or contact the pt directly at [phone].

## 2024-01-27 NOTE — Telephone Encounter (Signed)
 Glenda Nguyen pt - pt presented to Glenda office and stated she wants to see Glenda Nguyen in [place] at [facility] [phone].  She would like Glenda referral sent to him.  She also wanted you to know her PCP is Glenda Nguyen, Glenda Nguyen [phone] and f# [phone].  Glenda patient would like a call back advising Glenda status [phone].

## 2024-01-27 NOTE — Addendum Note (Signed)
 Addended by: VICENTA EMMIE HERO on: 01/27/2024 12:31 PM   Modules accepted: Orders

## 2024-03-24 ENCOUNTER — Inpatient Hospital Stay (HOSPITAL_COMMUNITY)
Admission: EM | Admit: 2024-03-24 | Discharge: 2024-03-31 | DRG: 643 | Disposition: A | Discharge status: Home Health | Attending: Internal Medicine | Admitting: Internal Medicine

## 2024-03-24 ENCOUNTER — Emergency Department (HOSPITAL_COMMUNITY)

## 2024-03-24 ENCOUNTER — Other Ambulatory Visit: Payer: Self-pay

## 2024-03-24 ENCOUNTER — Encounter (HOSPITAL_COMMUNITY): Payer: Self-pay

## 2024-03-24 DIAGNOSIS — M25511 Pain in right shoulder: Secondary | ICD-10-CM | POA: Diagnosis present

## 2024-03-24 DIAGNOSIS — R7303 Prediabetes: Secondary | ICD-10-CM | POA: Diagnosis present

## 2024-03-24 DIAGNOSIS — M19041 Primary osteoarthritis, right hand: Secondary | ICD-10-CM | POA: Diagnosis present

## 2024-03-24 DIAGNOSIS — G8929 Other chronic pain: Secondary | ICD-10-CM | POA: Diagnosis present

## 2024-03-24 DIAGNOSIS — D649 Anemia, unspecified: Secondary | ICD-10-CM | POA: Diagnosis present

## 2024-03-24 DIAGNOSIS — E222 Syndrome of inappropriate secretion of antidiuretic hormone: Secondary | ICD-10-CM | POA: Diagnosis present

## 2024-03-24 DIAGNOSIS — M1711 Unilateral primary osteoarthritis, right knee: Secondary | ICD-10-CM | POA: Diagnosis present

## 2024-03-24 DIAGNOSIS — E871 Hypo-osmolality and hyponatremia: Principal | ICD-10-CM | POA: Diagnosis present

## 2024-03-24 DIAGNOSIS — I808 Phlebitis and thrombophlebitis of other sites: Secondary | ICD-10-CM | POA: Diagnosis not present

## 2024-03-24 DIAGNOSIS — S0511XA Contusion of eyeball and orbital tissues, right eye, initial encounter: Secondary | ICD-10-CM

## 2024-03-24 DIAGNOSIS — I110 Hypertensive heart disease with heart failure: Secondary | ICD-10-CM | POA: Diagnosis present

## 2024-03-24 DIAGNOSIS — I5031 Acute diastolic (congestive) heart failure: Secondary | ICD-10-CM | POA: Diagnosis not present

## 2024-03-24 DIAGNOSIS — S0011XA Contusion of right eyelid and periocular area, initial encounter: Secondary | ICD-10-CM | POA: Diagnosis present

## 2024-03-24 DIAGNOSIS — I10 Essential (primary) hypertension: Secondary | ICD-10-CM | POA: Insufficient documentation

## 2024-03-24 DIAGNOSIS — I4891 Unspecified atrial fibrillation: Secondary | ICD-10-CM | POA: Diagnosis present

## 2024-03-24 DIAGNOSIS — R531 Weakness: Secondary | ICD-10-CM | POA: Diagnosis not present

## 2024-03-24 DIAGNOSIS — I82612 Acute embolism and thrombosis of superficial veins of left upper extremity: Secondary | ICD-10-CM | POA: Diagnosis not present

## 2024-03-24 DIAGNOSIS — Z79899 Other long term (current) drug therapy: Secondary | ICD-10-CM | POA: Diagnosis not present

## 2024-03-24 DIAGNOSIS — E878 Other disorders of electrolyte and fluid balance, not elsewhere classified: Secondary | ICD-10-CM | POA: Diagnosis present

## 2024-03-24 DIAGNOSIS — Z880 Allergy status to penicillin: Secondary | ICD-10-CM | POA: Diagnosis not present

## 2024-03-24 DIAGNOSIS — M7989 Other specified soft tissue disorders: Secondary | ICD-10-CM | POA: Diagnosis present

## 2024-03-24 DIAGNOSIS — D72823 Leukemoid reaction: Secondary | ICD-10-CM | POA: Diagnosis present

## 2024-03-24 DIAGNOSIS — W19XXXA Unspecified fall, initial encounter: Secondary | ICD-10-CM

## 2024-03-24 DIAGNOSIS — E7800 Pure hypercholesterolemia, unspecified: Secondary | ICD-10-CM | POA: Diagnosis present

## 2024-03-24 LAB — URINALYSIS, W/ REFLEX TO CULTURE (INFECTION SUSPECTED)
Bacteria, UA: NONE SEEN
Bilirubin Urine: NEGATIVE
Glucose, UA: NEGATIVE mg/dL
Ketones, ur: 5 mg/dL — AB
Leukocytes,Ua: NEGATIVE
Nitrite: NEGATIVE
Protein, ur: 30 mg/dL — AB
Specific Gravity, Urine: 1.02 (ref 1.005–1.030)
pH: 6 (ref 5.0–8.0)

## 2024-03-24 LAB — HEMOGLOBIN A1C
Hgb A1c MFr Bld: 6.1 % — ABNORMAL HIGH (ref 4.8–5.6)
Mean Plasma Glucose: 128.37 mg/dL

## 2024-03-24 LAB — COMPREHENSIVE METABOLIC PANEL WITH GFR
ALT: 33 U/L (ref 0–44)
AST: 36 U/L (ref 15–41)
Albumin: 3.3 g/dL — ABNORMAL LOW (ref 3.5–5.0)
Alkaline Phosphatase: 99 U/L (ref 38–126)
Anion gap: 14 (ref 5–15)
BUN: 15 mg/dL (ref 8–23)
CO2: 19 mmol/L — ABNORMAL LOW (ref 22–32)
Calcium: 8.5 mg/dL — ABNORMAL LOW (ref 8.9–10.3)
Chloride: 77 mmol/L — ABNORMAL LOW (ref 98–111)
Creatinine, Ser: 0.62 mg/dL (ref 0.44–1.00)
GFR, Estimated: >60 mL/min
Glucose, Bld: 107 mg/dL — ABNORMAL HIGH (ref 70–99)
Potassium: 4.3 mmol/L (ref 3.5–5.1)
Sodium: 110 mmol/L — CL (ref 135–145)
Total Bilirubin: 0.7 mg/dL (ref 0.0–1.2)
Total Protein: 6.3 g/dL — ABNORMAL LOW (ref 6.5–8.1)

## 2024-03-24 LAB — CBC WITH DIFFERENTIAL/PLATELET
Abs Immature Granulocytes: 0.13 K/uL — ABNORMAL HIGH (ref 0.00–0.07)
Basophils Absolute: 0 K/uL (ref 0.0–0.1)
Basophils Relative: 0 %
Eosinophils Absolute: 0 K/uL (ref 0.0–0.5)
Eosinophils Relative: 0 %
HCT: 27 % — ABNORMAL LOW (ref 36.0–46.0)
Hemoglobin: 9.9 g/dL — ABNORMAL LOW (ref 12.0–15.0)
Immature Granulocytes: 1 %
Lymphocytes Relative: 3 %
Lymphs Abs: 0.5 K/uL — ABNORMAL LOW (ref 0.7–4.0)
MCH: 31.3 pg (ref 26.0–34.0)
MCHC: 36.7 g/dL — ABNORMAL HIGH (ref 30.0–36.0)
MCV: 85.4 fL (ref 80.0–100.0)
Monocytes Absolute: 0.5 K/uL (ref 0.1–1.0)
Monocytes Relative: 3 %
Neutro Abs: 16.1 K/uL — ABNORMAL HIGH (ref 1.7–7.7)
Neutrophils Relative %: 93 %
Platelets: 406 K/uL — ABNORMAL HIGH (ref 150–400)
RBC: 3.16 MIL/uL — ABNORMAL LOW (ref 3.87–5.11)
RDW: 13.9 % (ref 11.5–15.5)
WBC: 17.4 K/uL — ABNORMAL HIGH (ref 4.0–10.5)
nRBC: 0 % (ref 0.0–0.2)

## 2024-03-24 LAB — SODIUM
Sodium: 110 mmol/L — CL (ref 135–145)
Sodium: 112 mmol/L — CL (ref 135–145)

## 2024-03-24 LAB — RESP PANEL BY RT-PCR (RSV, FLU A&B, COVID)  RVPGX2
Influenza A by PCR: NEGATIVE
Influenza B by PCR: NEGATIVE
Resp Syncytial Virus by PCR: NEGATIVE
SARS Coronavirus 2 by RT PCR: NEGATIVE

## 2024-03-24 LAB — MAGNESIUM: Magnesium: 1.7 mg/dL (ref 1.7–2.4)

## 2024-03-24 LAB — MRSA NEXT GEN BY PCR, NASAL: MRSA by PCR Next Gen: NOT DETECTED

## 2024-03-24 LAB — TSH: TSH: 2.76 u[IU]/mL (ref 0.350–4.500)

## 2024-03-24 LAB — PRO BRAIN NATRIURETIC PEPTIDE: Pro Brain Natriuretic Peptide: 2551 pg/mL — ABNORMAL HIGH

## 2024-03-24 LAB — OSMOLALITY, URINE: Osmolality, Ur: 600 mosm/kg (ref 300–900)

## 2024-03-24 LAB — T4, FREE: Free T4: 1.27 ng/dL (ref 0.80–2.00)

## 2024-03-24 LAB — VITAMIN B12: Vitamin B-12: 1127 pg/mL — ABNORMAL HIGH (ref 180–914)

## 2024-03-24 LAB — FOLATE: Folate: 11.6 ng/mL

## 2024-03-24 LAB — SODIUM, URINE, RANDOM: Sodium, Ur: <30 mmol/L

## 2024-03-24 LAB — CREATININE, URINE, RANDOM: Creatinine, Urine: 104 mg/dL

## 2024-03-24 LAB — PROCALCITONIN: Procalcitonin: 0.32 ng/mL

## 2024-03-24 LAB — OSMOLALITY: Osmolality: 236 mosm/kg — CL (ref 275–295)

## 2024-03-24 MED ORDER — ONDANSETRON HCL 4 MG/2ML IJ SOLN: 4.0000 mg | Freq: Four times a day (QID) | INTRAMUSCULAR | Status: DC | PRN

## 2024-03-24 MED ORDER — SODIUM CHLORIDE 0.9 % IV BOLUS
1000.0000 mL | Freq: Once | INTRAVENOUS | Status: AC
  Administered 2024-03-24: 1000 mL via INTRAVENOUS

## 2024-03-24 MED ORDER — ACETAMINOPHEN 325 MG PO TABS: 650.0000 mg | ORAL_TABLET | Freq: Four times a day (QID) | ORAL | Status: DC | PRN

## 2024-03-24 MED ORDER — SODIUM CHLORIDE 3 % IV SOLN
INTRAVENOUS | Status: DC
  Filled 2024-03-24 (×3): qty 500

## 2024-03-24 MED ORDER — MAGNESIUM SULFATE 2 GM/50ML IV SOLN
2.0000 g | Freq: Once | INTRAVENOUS | Status: AC
  Administered 2024-03-24: 2 g via INTRAVENOUS
  Filled 2024-03-24: qty 50

## 2024-03-24 MED ORDER — OXYCODONE-ACETAMINOPHEN 5-325 MG PO TABS
1.0000 | ORAL_TABLET | Freq: Once | ORAL | Status: AC
  Administered 2024-03-24: 1 via ORAL
  Filled 2024-03-24: qty 1

## 2024-03-24 MED ORDER — ACETAMINOPHEN 650 MG RE SUPP: 650.0000 mg | Freq: Four times a day (QID) | RECTAL | Status: DC | PRN

## 2024-03-24 MED ORDER — IBUPROFEN 400 MG PO TABS
400.0000 mg | ORAL_TABLET | Freq: Once | ORAL | Status: AC
  Administered 2024-03-24: 400 mg via ORAL
  Filled 2024-03-24: qty 1

## 2024-03-24 MED ORDER — METOPROLOL TARTRATE 5 MG/5ML IV SOLN
2.5000 mg | Freq: Once | INTRAVENOUS | Status: AC
  Administered 2024-03-24: 2.5 mg via INTRAVENOUS
  Filled 2024-03-24: qty 5

## 2024-03-24 MED ORDER — HEPARIN SODIUM (PORCINE) 5000 UNIT/ML IJ SOLN
5000.0000 [IU] | Freq: Three times a day (TID) | INTRAMUSCULAR | Status: DC
  Administered 2024-03-24 – 2024-03-25 (×2): 5000 [IU] via SUBCUTANEOUS
  Filled 2024-03-24 (×2): qty 1

## 2024-03-24 MED ORDER — CHLORHEXIDINE GLUCONATE CLOTH 2 % EX PADS
6.0000 | MEDICATED_PAD | Freq: Every day | CUTANEOUS | Status: DC
  Administered 2024-03-25 – 2024-03-31 (×7): 6 via TOPICAL

## 2024-03-24 MED ORDER — ONDANSETRON HCL 4 MG PO TABS: 4.0000 mg | ORAL_TABLET | Freq: Four times a day (QID) | ORAL | Status: DC | PRN

## 2024-03-24 MED ORDER — LACTATED RINGERS IV BOLUS
1000.0000 mL | Freq: Once | INTRAVENOUS | Status: AC
  Administered 2024-03-24: 1000 mL via INTRAVENOUS

## 2024-03-24 MED ORDER — HYDROCODONE-ACETAMINOPHEN 5-325 MG PO TABS
1.0000 | ORAL_TABLET | Freq: Four times a day (QID) | ORAL | Status: DC | PRN
  Administered 2024-03-24 – 2024-03-30 (×10): 1 via ORAL
  Filled 2024-03-24 (×5): qty 1

## 2024-03-24 MED ORDER — METOPROLOL TARTRATE 25 MG PO TABS
25.0000 mg | ORAL_TABLET | Freq: Two times a day (BID) | ORAL | Status: DC
  Administered 2024-03-24: 25 mg via ORAL
  Filled 2024-03-24: qty 1

## 2024-03-24 NOTE — H&P (Signed)
 " History and Physical    Patient: Glenda Nguyen FMW:[id number] DOB: 1934/07/31 DOA: 03/24/2024 DOS: the patient was seen and examined on 03/24/2024 PCP: Vick Lurie, FNP (Inactive)  Patient coming from: Home  Chief Complaint:  Chief Complaint  Patient presents with   Fall   Leg Swelling   HPI: Glenda Nguyen is a 89 year old female with a history of hypertension and hyperlipidemia presenting with a fall on the morning of 03/24/2024.  The patient states that she has had some nausea and decreased oral intake for at least the past week.  She was feeling a bit dizzy.  She was getting out of bed and sat on the edge of the bed.  She felt like she fell off the edge of the bed onto her right side.  She denied loss of consciousness.  She did hit her head.  She was weak and had difficulty getting off the floor.  She called her daughter-in-law who called the patient's grandson to come help her. The patient complains of pain in her right hand which has been chronic.  She went to see orthopedic surgery in October.  There were plans for operation for carpal tunnel.  She has some right shoulder pain. She denied any fevers, chills, headache, neck pain, chest pain, shortness breath, abdominal pain, Vomiting, diarrhea.  She denies any dysuria or hematuria.  She has not had any hematochezia or melena.  She denies any new medications.  She is a non-smoker.  She does not drink alcohol .  She denies illicit drug use.  In the ED, the patient was afebrile and hemodynamically stable with oxygen saturation 95% room air.  She was tachycardic in the 110s.  WBC 17.4, hemoglobin 9.9, platelets 406.  Sodium 110, potassium 4.3, bicarbonate 19, serum creatinine 0.62.  AST 36, ALT 33, alk phosphatase 99, total bilirubin 0.7.  Albumin is 3.3.  Magnesium  1.7.  EKG showed atrial fibrillation with nonspecific ST changes.  Chest x-ray was negative for any infiltrates or edema.  There was a deformity to right clavicle.   CT of the brain was negative for any acute findings.  There was right periorbital swelling of the soft tissues.  CT cervical spine was negative for any traumatic listhesis.  X-ray of the right knee was negative for fracture or dislocation.  The patient was given 2 L of fluid in the ED.  She was admitted for further evaluation and treatment of her hyponatremia.  Review of Systems: As mentioned in the history of present illness. All other systems reviewed and are negative. Past Medical History:  Diagnosis Date   High cholesterol    Hypertension    Urticaria    Past Surgical History:  Procedure Laterality Date   CATARACT EXTRACTION     Social History:  reports that she has never smoked. She has never used smokeless tobacco. She reports that she does not drink alcohol  and does not use drugs.  Allergies[1]  Family History  Problem Relation Age of Onset   Allergic rhinitis Neg Hx    Asthma Neg Hx    Eczema Neg Hx    Urticaria Neg Hx     Prior to Admission medications  Medication Sig Start Date End Date Taking? Authorizing Provider  CALCIUM PO Take 1 tablet by mouth daily.   Yes [provider]  carvedilol (COREG) 3.125 MG tablet Take 3.125 mg by mouth 2 (two) times daily with a meal. 06/25/19  Yes [provider]  CRANBERRY PO Take 1  tablet by mouth daily.   Yes [provider]  triamcinolone cream (KENALOG) 0.5 % Apply 1 application  topically 3 (three) times daily as needed.   Yes [provider]  MYRBETRIQ 25 MG TB24 tablet Take 25 mg by mouth daily. Patient not taking: Reported on 03/24/2024 03/13/24   [provider]    Physical Exam: Vitals:   03/24/24 1230 03/24/24 1300 03/24/24 1330 03/24/24 1422  BP: (!) 148/100 (!) 168/98 (!) 177/97   Pulse: (!) 104 (!) 103 (!) 119   Resp: 17 17 (!) 21   Temp:    97.8 F (36.6 C)  TempSrc:    Oral  SpO2: 95% (!) 77% 98%   Weight:      Height:       GENERAL:  A&O x 3, NAD, well developed,  cooperative, follows commands HEENT: [place]/AT, No thrush, No icterus, No oral ulcers Neck:  No neck mass, No meningismus, soft, supple CV: RRR, no S3, no S4, no rub, no JVD Lungs: Bibasilar crackles.  No wheezing Abd: soft/NT +BS, nondistended Ext: 2 + LE edema, no lymphangitis, no cyanosis, no rashes Neuro:  CN II-XII intact, strength 4/5 in RUE, RLE, strength 4/5 LUE, LLE; sensation intact bilateral; no dysmetria; babinski equivocal  Data Reviewed: Data reviewed above in history  Assessment and Plan: Hyponatremia -Clinically the patient appears to be hypervolemic - She was alert and oriented x 3 - Urine osmolarity - Serum osmolarity - Urine sodium - Urine creatinine  New onset atrial fibrillation - Patient states that she was told by her orthopedist about 2 months ago that she had atrial fibrillation - She has never followed up with her PCP - TSH - Echocardiogram  Essential hypertension - Restart carvedilol  Leukemoid reaction - Blood cultures x 2 sets - Obtain UA - Personally reviewed chest x-ray--no infiltrates - Check PCT    Advance Care Planning: FULL  Consults: none  Family Communication: none  Severity of Illness: The appropriate patient status for this patient is OBSERVATION. Observation status is judged to be reasonable and necessary in order to provide the required intensity of service to ensure the patient's safety. The patient's presenting symptoms, physical exam findings, and initial radiographic and laboratory data in the context of their medical condition is felt to place them at decreased risk for further clinical deterioration. Furthermore, it is anticipated that the patient will be medically stable for discharge from the hospital within 2 midnights of admission.   Author: Alm Schneider, MD 03/24/2024 3:17 PM  For on call review [email or web address].     [1]  Allergies Allergen Reactions   Penicillins Rash   "

## 2024-03-24 NOTE — Plan of Care (Signed)

## 2024-03-24 NOTE — Progress Notes (Signed)
" ° °  PCCM transfer request    Sending physician: Dr. Ellouise  Sending facility: [facility]  Reason for transfer:hyponatremia  Brief case summary:  89 year old felt weak unwell.  Presented to ED.  Sounds like poor p.o. intake preceding.  Little lightheaded.  Sodium 110.  Neurologically intact per ED physician.  ICU disposition due to frequent lab monitoring.  Patient has received LR bolus prior to lab results.  Agree with ICU admission but I think can be facilitated locally.  Frequent lab checks.  PCCM and/or nephrology can assist in consultation if desired.  Recommendations made prior to transfer: -- Check urine Osmo and urine sodium to help delineate causes of hyponatremia, Sounds volume down but BNP also elevated, no obvious culprits for SIADH on medication list. -- Every 4-6 hour sodium checks and adjust fluids, interventions as needed  Transfer accepted: no    Donnice JONELLE Beals  MD 03/24/2024 1:57 PM [facility]  For contact information, see Amion. If no response to pager, please call PCCM consult pager. After hours, 7PM- 7AM, please call Elink.  "

## 2024-03-24 NOTE — ED Provider Notes (Signed)
 " [place] EMERGENCY DEPARTMENT AT [facility] Provider Note   CSN: [id number] Arrival date & time: 03/24/24  1002     Patient presents with: Fall and Leg Swelling   Glenda Nguyen is a 89 y.o. female.   Patient is an 89 year old female with a past medical history of hypertension and arthritis presenting to the emergency department after a fall.  The patient states that she had not been sleeping well and when she went to get up this morning was sitting on the side of her bed and is not sure if she fell asleep causing her to fall out of the bed and hit her head.  She denies any loss of consciousness. Denies any blood thinner use. She states that her head is tender to touch but denies any headache.  The patient states that she has had no appetite for the last 2 weeks.  Denies any nausea or vomiting.  She states that she has had pain in her right hand due to her arthritis that she has seen orthopedics for but denies any new pain or injuries to this hand.  Denies any numbness or weakness to me.  She states that she has chronic swelling in her legs that gets better when her legs are up.  Unchanged from baseline.  She denies any recent cough, congestion or sore throat or diarrhea.  The history is provided by the patient.  Fall       Prior to Admission medications  Medication Sig Start Date End Date Taking? Authorizing Provider  CALCIUM PO Take 1 tablet by mouth daily.   Yes [provider]  carvedilol (COREG) 3.125 MG tablet Take 3.125 mg by mouth 2 (two) times daily with a meal. 06/25/19  Yes [provider]  CRANBERRY PO Take 1 tablet by mouth daily.   Yes [provider]  triamcinolone cream (KENALOG) 0.5 % Apply 1 application  topically 3 (three) times daily as needed.   Yes [provider]  MYRBETRIQ 25 MG TB24 tablet Take 25 mg by mouth daily. Patient not taking: Reported on 03/24/2024 03/13/24   [provider]     Allergies: Penicillins    Review of Systems  Updated Vital Signs BP (!) 177/97   Pulse (!) 119   Temp 97.8 F (36.6 C) (Oral)   Resp (!) 21   Ht 5' 6 (1.676 m)   Wt 74.8 kg   SpO2 98%   BMI 26.63 kg/m   Physical Exam Vitals and nursing note reviewed.  Constitutional:      General: She is not in acute distress.    Appearance: Normal appearance.  HENT:     Head: Normocephalic.     Nose: Nose normal.     Mouth/Throat:     Mouth: Mucous membranes are moist.     Pharynx: Oropharynx is clear.  Eyes:     Extraocular Movements: Extraocular movements intact.     Conjunctiva/sclera: Conjunctivae normal.     Pupils: Pupils are equal, round, and reactive to light.     Comments: R periorbital contusion and swelling  Neck:     Comments: No midline neck tenderness Cardiovascular:     Rate and Rhythm: Tachycardia present. Rhythm irregular.     Heart sounds: Normal heart sounds.  Pulmonary:     Effort: Pulmonary effort is normal.     Breath sounds: Normal breath sounds.  Abdominal:     General: Abdomen is flat.     Palpations: Abdomen  is soft.     Tenderness: There is no abdominal tenderness.  Musculoskeletal:        General: Swelling (L hand - at baseline per patient) and tenderness (R knee, ROM intact, no hip pain bilaterally) present. Normal range of motion.     Cervical back: Normal range of motion and neck supple.     Right lower leg: Edema (1+ to knee) present.     Left lower leg: Edema (1+ to knee) present.     Comments: No bony tenderness to bilateral UE or LLE, no midline back tenderness, pelvis stable, non-tender  Skin:    General: Skin is warm and dry.  Neurological:     General: No focal deficit present.     Mental Status: She is alert and oriented to person, place, and time.  Psychiatric:        Mood and Affect: Mood normal.        Behavior: Behavior normal.     (all labs ordered are listed, but only abnormal results are displayed) Labs Reviewed  CBC  WITH DIFFERENTIAL/PLATELET - Abnormal; Notable for the following components:      Result Value   WBC 17.4 (*)    RBC 3.16 (*)    Hemoglobin 9.9 (*)    HCT 27.0 (*)    MCHC 36.7 (*)    Platelets 406 (*)    Neutro Abs 16.1 (*)    Lymphs Abs 0.5 (*)    Abs Immature Granulocytes 0.13 (*)    All other components within normal limits  COMPREHENSIVE METABOLIC PANEL WITH GFR - Abnormal; Notable for the following components:   Sodium 110 (*)    Chloride 77 (*)    CO2 19 (*)    Glucose, Bld 107 (*)    Calcium 8.5 (*)    Total Protein 6.3 (*)    Albumin 3.3 (*)    All other components within normal limits  PRO BRAIN NATRIURETIC PEPTIDE - Abnormal; Notable for the following components:   Pro Brain Natriuretic Peptide 2,551.0 (*)    All other components within normal limits  RESP PANEL BY RT-PCR (RSV, FLU A&B, COVID)  RVPGX2  MAGNESIUM   URINALYSIS, W/ REFLEX TO CULTURE (INFECTION SUSPECTED)    EKG: EKG Interpretation Date/Time:  Saturday March 24 2024 10:38:46 EST Ventricular Rate:  102 PR Interval:    QRS Duration:  85 QT Interval:  353 QTC Calculation: 460 R Axis:   89  Text Interpretation: Normal sinus rhythm with frequent PACs Borderline right axis deviation No previous ECGs available Confirmed by Ellouise Fine ([id number]) on 03/24/2024 10:40:50 AM  Radiology: DG Chest 1 View Result Date: 03/24/2024 CLINICAL DATA:  Weakness and fall. EXAM: CHEST  1 VIEW COMPARISON:  None available. FINDINGS: The heart is mildly enlarged. No pulmonary vascular congestion. Mediastinal silhouette within normal limits. Lungs are clear. Deformity of the lateral RIGHT clavicle suspicious for fracture. IMPRESSION: 1. Mild cardiomegaly. 2. Deformity of the lateral RIGHT clavicle suspicious for fracture. Please correlate for focal tenderness. Electronically Signed   By: Aliene Lloyd M.D.   On: 03/24/2024 14:04   DG Knee Complete 4 Views Right Result Date: 03/24/2024 EXAM: 4 VIEW(S) XRAY OF THE KNEE  03/24/2024 11:07:00 AM COMPARISON: None available. CLINICAL HISTORY: fall FINDINGS: BONES AND JOINTS: No acute fracture. No malalignment. No significant joint effusion. Mild lateral compartment joint space narrowing. Marginal osteophytosis and subchondral sclerosis of the lateral compartment. Additional subtle marginal osteophytosis of the medial compartment. SOFT TISSUES: Mild diffuse soft tissue  swelling. IMPRESSION: 1. No acute fracture or dislocation. 2. Mild diffuse soft tissue swelling. 3. Lateral compartment predominant osteoarthritis. Electronically signed by: Donnice Mania MD 03/24/2024 11:27 AM EST RP Workstation: [id number]   CT Cervical Spine Wo Contrast Result Date: 03/24/2024 EXAM: CT CERVICAL SPINE WITHOUT CONTRAST 03/24/2024 10:59:54 AM TECHNIQUE: CT of the cervical spine was performed without the administration of intravenous contrast. Multiplanar reformatted images are provided for review. Automated exposure control, iterative reconstruction, and/or weight based adjustment of the mA/kV was utilized to reduce the radiation dose to as low as reasonably achievable. COMPARISON: None available. CLINICAL HISTORY: Neck trauma (Age >= 65y) FINDINGS: BONES AND ALIGNMENT: Straightening of the normal cervical lordosis. Trace degenerative anterolisthesis of C2 on C3 and C3 on C4. Additional trace degenerative anterolisthesis of C7 on T1. No acute fracture or traumatic malalignment. DEGENERATIVE CHANGES: Moderate disc space narrowing at C4-C5. Additional disc space narrowing at C5-C6 and C6-C7. Mild degenerative endplate osteophytes at multiple levels. There are disc osteophyte complexes at multiple levels without evidence of high grade osseous spinal canal stenosis. Facet arthrosis and uncovertebral hypertrophy at multiple levels. SOFT TISSUES: No prevertebral soft tissue swelling. LUNGS: Scarring and atelectasis in the right lung apex. IMPRESSION: 1. No evidence of acute traumatic injury. 2. Degenerative  changes as above. Electronically signed by: Donnice Mania MD 03/24/2024 11:25 AM EST RP Workstation: [id number]   CT Head Wo Contrast Result Date: 03/24/2024 EXAM: CT HEAD WITHOUT CONTRAST 03/24/2024 10:59:54 AM TECHNIQUE: CT of the head was performed without the administration of intravenous contrast. Automated exposure control, iterative reconstruction, and/or weight based adjustment of the mA/kV was utilized to reduce the radiation dose to as low as reasonably achievable. COMPARISON: None available. CLINICAL HISTORY: Head trauma, minor (Age >= 65y). FINDINGS: BRAIN AND VENTRICLES: No acute hemorrhage. 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 Mild chronic microvascular ischemic change. No evidence of acute infarct. No hydrocephalus. No extra-axial collection. No mass effect or midline shift. ORBITS: Bilateral lens replacement noted. SINUSES: Trace right mastoid effusion. SOFT TISSUES AND SKULL: Right periorbital soft tissue swelling. No skull fracture. IMPRESSION: 1. No acute intracranial abnormality. 2. Right periorbital soft tissue swelling. 3. Focal encephalomalacia along the anterolateral left temporal lobe, suggestive of small remote infarct or sequelae of prior trauma. Electronically signed by: Donnice Mania MD 03/24/2024 11:18 AM EST RP Workstation: [id number]     .Critical Care  Performed by: Kingsley, Thom Ollinger K, DO Authorized by: Ellouise Richerd POUR, DO   Critical care provider statement:    Critical care time (minutes):  30   Critical care was time spent personally by me on the following activities:  Development of treatment plan with patient or surrogate, discussions with consultants, evaluation of patient's response to treatment, examination of patient, ordering and review of laboratory studies, ordering and review of radiographic studies, ordering and performing treatments and interventions, pulse  oximetry, re-evaluation of patient's condition and review of old charts    Medications Ordered in the ED  oxyCODONE -acetaminophen  (PERCOCET/ROXICET) 5-325 MG per tablet 1 tablet (1 tablet Oral Given 03/24/24 1039)  lactated ringers  bolus 1,000 mL (0 mLs Intravenous Stopped 03/24/24 1336)  sodium chloride  0.9 % bolus 1,000 mL (1,000 mLs Intravenous Bolus from Bag 03/24/24 1343)  ibuprofen  (ADVIL ) tablet 400 mg (400 mg Oral Given 03/24/24 1414)    Clinical Course as of 03/24/24 1443  Sat Mar 24, 2024  1135 XR with osteoarthritis of R knee, no other traumatic injury on imaging.  [  VK]  1240 Labs with severe hyponatremia and hypochloremia. She has normal mental status and does not require hypertonic saline. Leukocytosis and elevated BNP. Will obtain CXR and viral swab, UA pending to eval for source of possible infection. Will discuss with critical care if she needs ICU admission nfor severe hyponatremia.  [VK]  1352 I spoke with Dr. Annella with critical care who recommends consulting hospitalist for admission at [facility] ICU for frequent lab checks. They would be able to follow in consult and could have nephro consult.  [VK]  1426 XR with cardiomegaly, no pulmonary edema. Possible clavicular fracture, but patient has no bony tenderness here making this unlikely to be an acute fracture.  [VK]    Clinical Course User Index [VK] Kingsley, Jesica Goheen K, DO                                 Medical Decision Making This patient presents to the ED with chief complaint(s) of fall with pertinent past medical history of HTN, arthritis which further complicates the presenting complaint. The complaint involves an extensive differential diagnosis and also carries with it a high risk of complications and morbidity.    The differential diagnosis includes ICH, mass effect, cervical spine fracture, knee fracture, dislocation, contusion, no other traumatic injuries seen on exam, weakness concern for arrhythmia,  anemia, dehydration, electrolyte abnormality, infection  Additional history obtained: Additional history obtained from N/A Records reviewed Care Everywhere/External Records  ED Course and Reassessment: On patient's arrival she is mildly tachycardic and otherwise hemodynamically stable in no acute distress.  Patient will have imaging to evaluate for traumatic injury and will have EKG and labs to evaluate for etiology of her weakness and she will be closely reassessed.  Independent labs interpretation:  The following labs were independently interpreted: severe hyponatremia and hypochloremia, leukocytosis, elevated BNP  Independent visualization of imaging: - I independently visualized the following imaging with scope of interpretation limited to determining acute life threatening conditions related to emergency care: CXR, CTH/Cspine, R knee XR, which revealed cardiomegaly without pulmonary edema, no traumatic injury  Consultation: - Consulted or discussed management/test interpretation w/ external professional: hospitalist, critical care  Consideration for admission or further workup: patient requires admission for hyponatremia Social Determinants of health: N/A    Amount and/or Complexity of Data Reviewed Labs: ordered. Radiology: ordered.  Risk Prescription drug management. Decision regarding hospitalization.       Final diagnoses:  Hyponatremia  Fall, initial encounter  Periorbital contusion of right eye, initial encounter  Generalized weakness    ED Discharge Orders     None          Ellouise Richerd POUR, DO 03/24/24 1443  "

## 2024-03-24 NOTE — ED Triage Notes (Signed)
 Pt from home complains of weakness, fall around 630am, pt endorsed hitting right temporal area of head, small abrasion noted also complains of shoulder pain from fall. Pt AAOX4, ambulatory with assistance. Bilateral mild pitting edema of lower extremities. Also endorse right hand numbness and pain.

## 2024-03-24 NOTE — Hospital Course (Addendum)
 89 year old female with a history of hypertension and hyperlipidemia presenting with a fall on the morning of 03/24/2024.  The patient states that she has had some nausea and decreased oral intake for at least the past week.  She was feeling a bit dizzy.  She was getting out of bed and sat on the edge of the bed.  She felt like she fell off the edge of the bed onto her right side.  She denied loss of consciousness.  She did hit her head.  She was weak and had difficulty getting off the floor.  She called her daughter-in-law who called the patient's grandson to come help her. The patient complains of pain in her right hand which has been chronic.  She went to see orthopedic surgery in October.  There were plans for operation for carpal tunnel.  She has some right shoulder pain. She denied any fevers, chills, headache, neck pain, chest pain, shortness breath, abdominal pain, Vomiting, diarrhea.  She denies any dysuria or hematuria.  She has not had any hematochezia or melena.  She denies any new medications.  She is a non-smoker.  She does not drink alcohol .  She denies illicit drug use.  In the ED, the patient was afebrile and hemodynamically stable with oxygen saturation 95% room air.  She was tachycardic in the 110s.  WBC 17.4, hemoglobin 9.9, platelets 406.  Sodium 110, potassium 4.3, bicarbonate 19, serum creatinine 0.62.  AST 36, ALT 33, alk phosphatase 99, total bilirubin 0.7.  Albumin is 3.3.  Magnesium  1.7.  EKG showed atrial fibrillation with nonspecific ST changes.  proBNP 2551  Chest x-ray was negative for any infiltrates or edema.  There was a deformity to right clavicle.  CT of the brain was negative for any acute findings.  There was right periorbital swelling of the soft tissues.  CT cervical spine was negative for any traumatic listhesis.  X-ray of the right knee was negative for fracture or dislocation.  The patient was given 2 L of fluid in the ED.  She was admitted for further evaluation and  treatment of her hyponatremia.

## 2024-03-24 NOTE — Progress Notes (Signed)
 Critical result Documentation   03/24/24 2215  Provider Notification  Provider Name/Title Erminio Cone, NP  Date Provider Notified 03/24/24  Time Provider Notified 2216  Method of Notification Page (Secure chat)  Notification Reason Critical Result  Test performed and critical result Serum Osmolality : 236  Date Critical Result Received 03/24/24  Time Critical Result Received 2213  Provider response Other (Comment)

## 2024-03-24 NOTE — Progress Notes (Signed)
 Critical lab Result  03/24/24 2237  Provider Notification  Provider Name/Title Erminio Cone, NP  Date Provider Notified 03/24/24  Time Provider Notified 2237  Method of Notification Page (secure chat)  Notification Reason Critical Result  Test performed and critical result Sodium 112  Date Critical Result Received 03/24/24  Time Critical Result Received 2235

## 2024-03-25 ENCOUNTER — Inpatient Hospital Stay (HOSPITAL_COMMUNITY)

## 2024-03-25 DIAGNOSIS — I4891 Unspecified atrial fibrillation: Secondary | ICD-10-CM

## 2024-03-25 DIAGNOSIS — E871 Hypo-osmolality and hyponatremia: Secondary | ICD-10-CM

## 2024-03-25 DIAGNOSIS — I10 Essential (primary) hypertension: Secondary | ICD-10-CM

## 2024-03-25 LAB — CBC
HCT: 27.3 % — ABNORMAL LOW (ref 36.0–46.0)
Hemoglobin: 10 g/dL — ABNORMAL LOW (ref 12.0–15.0)
MCH: 31.5 pg (ref 26.0–34.0)
MCHC: 36.6 g/dL — ABNORMAL HIGH (ref 30.0–36.0)
MCV: 86.1 fL (ref 80.0–100.0)
Platelets: 413 K/uL — ABNORMAL HIGH (ref 150–400)
RBC: 3.17 MIL/uL — ABNORMAL LOW (ref 3.87–5.11)
RDW: 14.2 % (ref 11.5–15.5)
WBC: 14.5 K/uL — ABNORMAL HIGH (ref 4.0–10.5)
nRBC: 0 % (ref 0.0–0.2)

## 2024-03-25 LAB — SODIUM
Sodium: 112 mmol/L — CL (ref 135–145)
Sodium: 113 mmol/L — CL (ref 135–145)
Sodium: 116 mmol/L — CL (ref 135–145)
Sodium: 117 mmol/L — CL (ref 135–145)
Sodium: 118 mmol/L — CL (ref 135–145)
Sodium: 118 mmol/L — CL (ref 135–145)

## 2024-03-25 LAB — ECHOCARDIOGRAM COMPLETE
Area-P 1/2: 4.67 cm2
Height: 67 in
P 1/2 time: 465 ms
S' Lateral: 2.6 cm
Weight: 3012.37 [oz_av]

## 2024-03-25 LAB — IRON AND TIBC
Iron: 16 ug/dL — ABNORMAL LOW (ref 28–170)
Saturation Ratios: 6 % — ABNORMAL LOW (ref 10.4–31.8)
TIBC: 248 ug/dL — ABNORMAL LOW (ref 250–450)
UIBC: 232 ug/dL

## 2024-03-25 LAB — VITAMIN B12: Vitamin B-12: 1182 pg/mL — ABNORMAL HIGH (ref 180–914)

## 2024-03-25 LAB — MAGNESIUM: Magnesium: 2 mg/dL (ref 1.7–2.4)

## 2024-03-25 LAB — FOLATE: Folate: 7.6 ng/mL

## 2024-03-25 LAB — FERRITIN: Ferritin: 301 ng/mL (ref 11–307)

## 2024-03-25 MED ORDER — APIXABAN 5 MG PO TABS
5.0000 mg | ORAL_TABLET | Freq: Two times a day (BID) | ORAL | Status: DC
  Administered 2024-03-25 – 2024-03-31 (×13): 5 mg via ORAL
  Filled 2024-03-25 (×2): qty 1

## 2024-03-25 MED ORDER — IPRATROPIUM-ALBUTEROL 0.5-2.5 (3) MG/3ML IN SOLN
3.0000 mL | Freq: Three times a day (TID) | RESPIRATORY_TRACT | Status: DC
  Administered 2024-03-25 – 2024-03-26 (×3): 3 mL via RESPIRATORY_TRACT
  Filled 2024-03-25 (×2): qty 3

## 2024-03-25 MED ORDER — METOPROLOL TARTRATE 25 MG PO TABS
37.5000 mg | ORAL_TABLET | Freq: Two times a day (BID) | ORAL | Status: DC
  Administered 2024-03-25 – 2024-03-26 (×2): 37.5 mg via ORAL
  Filled 2024-03-25 (×2): qty 2

## 2024-03-25 MED ORDER — BUDESONIDE 0.5 MG/2ML IN SUSP
0.5000 mg | Freq: Two times a day (BID) | RESPIRATORY_TRACT | Status: DC
  Administered 2024-03-25 – 2024-03-31 (×13): 0.5 mg via RESPIRATORY_TRACT
  Filled 2024-03-25 (×2): qty 2

## 2024-03-25 NOTE — Progress Notes (Signed)
 Date and time results received: 03/25/2024 1405 (use smartphrase .now to insert current time)  Test: Sodium   Critical Value: 117  Name of Provider Notified: Dr. Evonnie  Orders Received? Or Actions Taken?: MD notified. Continue 3% saline.

## 2024-03-25 NOTE — TOC CM/SW Note (Signed)
 Transition of Care [facility]) - Inpatient Brief Assessment   Patient Details  Name: Glenda Nguyen MRN: [id number] Date of Birth: Nov 13, 1934  Transition of Care [facility]) CM/SW Contact:    Lucie Lunger, LCSWA Phone Number: 03/25/2024, 10:25 AM   Clinical Narrative: Transition of Care Department [facility]) has reviewed patient and no TOC needs have been identified at this time. We will continue to monitor patient advancement through interdiciplinary progression rounds. If new patient transition needs arise, please place a TOC consult.  Transition of Care Asessment: Insurance and Status: Insurance coverage has been reviewed Patient has primary care physician: Yes Home environment has been reviewed: From home Prior level of function:: Independent Prior/Current Home Services: No current home services Social Drivers of Health Review: SDOH reviewed no interventions necessary Readmission risk has been reviewed: Yes Transition of care needs: no transition of care needs at this time

## 2024-03-25 NOTE — Progress Notes (Signed)
 "          PROGRESS NOTE  Glenda Nguyen FMW:[id number] DOB: June 10, 1934 DOA: 03/24/2024 PCP: Vick Lurie, FNP (Inactive)  Brief History:  89 year old female with a history of hypertension and hyperlipidemia presenting with a fall on the morning of 03/24/2024.  The patient states that she has had some nausea and decreased oral intake for at least the past week.  She was feeling a bit dizzy.  She was getting out of bed and sat on the edge of the bed.  She felt like she fell off the edge of the bed onto her right side.  She denied loss of consciousness.  She did hit her head.  She was weak and had difficulty getting off the floor.  She called her daughter-in-law who called the patient's grandson to come help her. The patient complains of pain in her right hand which has been chronic.  She went to see orthopedic surgery in October.  There were plans for operation for carpal tunnel.  She has some right shoulder pain. She denied any fevers, chills, headache, neck pain, chest pain, shortness breath, abdominal pain, Vomiting, diarrhea.  She denies any dysuria or hematuria.  She has not had any hematochezia or melena.  She denies any new medications.  She is a non-smoker.  She does not drink alcohol .  She denies illicit drug use.  In the ED, the patient was afebrile and hemodynamically stable with oxygen saturation 95% room air.  She was tachycardic in the 110s.  WBC 17.4, hemoglobin 9.9, platelets 406.  Sodium 110, potassium 4.3, bicarbonate 19, serum creatinine 0.62.  AST 36, ALT 33, alk phosphatase 99, total bilirubin 0.7.  Albumin is 3.3.  Magnesium  1.7.  EKG showed atrial fibrillation with nonspecific ST changes.  proBNP 2551  Chest x-ray was negative for any infiltrates or edema.  There was a deformity to right clavicle.  CT of the brain was negative for any acute findings.  There was right periorbital swelling of the soft tissues.  CT cervical spine was negative for any traumatic listhesis.   X-ray of the right knee was negative for fracture or dislocation.  The patient was given 2 L of fluid in the ED.  She was admitted for further evaluation and treatment of her hyponatremia.   Assessment/Plan: Hyponatremia -Clinically the patient appears to be hypervolemic - She was alert and oriented x 3 - Urine osmolarity 600 - Serum osmolarity 236 - Urine sodium <30 - Urine creatinine 104 - she appears hypervolemic, but will hold off lasix  at least another 24 hours to avoid rapid rise in Na - continue hypertonic 3% saline for now - monitor Na q 4 hours   New onset atrial fibrillation - Patient states that she was told by her orthopedist about 2 months ago that she had atrial fibrillation - She has never followed up with her PCP - TSH 2.760 - Echocardiogram pending - CHADSVASC2 = 4 (Age x 2, HTN, female) - start apixaban  - started metoprolol  in lieu of coreg for HR control   Essential hypertension - d/c coreg - start metoprolol  for rate control for afib   Leukemoid reaction - Blood cultures x 2 sets - Obtain UA--0-5 WBC - Personally reviewed chest x-ray--no infiltrates - Check PCT 0.32 - remains afebrile and hemodynamically stable  Pre-Diabetes -12/27 A1C--6.1  LUE edema -venous duplex      Family Communication:   no Family at bedside  Consultants:  none  Code Status:  FULL  DVT Prophylaxis: apixaban    Procedures: As Listed in Progress Note Above  Antibiotics: None        Subjective: Patient denies fevers, chills, headache, chest pain, dyspnea, nausea, vomiting, diarrhea, abdominal pain, dysuria, hematuria, hematochezia, and melena.   Objective: Vitals:   03/25/24 0400 03/25/24 0454 03/25/24 0500 03/25/24 0752  BP: (!) 145/90  (!) 159/59   Pulse: 98  (!) 113   Resp: 13  17   Temp:  98.3 F (36.8 C)  97.8 F (36.6 C)  TempSrc:  Oral  Oral  SpO2: 98%  95%   Weight:      Height:        Intake/Output Summary (Last 24 hours) at  03/25/2024 1108 Last data filed at 03/25/2024 0802 Gross per 24 hour  Intake 1174.6 ml  Output --  Net 1174.6 ml   Weight change:  Exam:  General:  Pt is alert, follows commands appropriately, not in acute distress HEENT: No icterus, No thrush, No neck mass, [place]/AT Cardiovascular: RRR, S1/S2, no rubs, no gallops Respiratory: CTA bilaterally, no wheezing, no crackles, no rhonchi Abdomen: Soft/+BS, non tender, non distended, no guarding Extremities: 1 + LUE, 1 + LE edema, No lymphangitis, No petechiae, No rashes, no synovitis   Data Reviewed: I have personally reviewed following labs and imaging studies Basic Metabolic Panel: Recent Labs  Lab 03/24/24 1110 03/24/24 1723 03/24/24 2135 03/25/24 0136 03/25/24 0552  NA 110* 110* 112* 112* 113*  K 4.3  --   --   --   --   CL 77*  --   --   --   --   CO2 19*  --   --   --   --   GLUCOSE 107*  --   --   --   --   BUN 15  --   --   --   --   CREATININE 0.62  --   --   --   --   CALCIUM 8.5*  --   --   --   --   MG 1.7  --   --   --  2.0   Liver Function Tests: Recent Labs  Lab 03/24/24 1110  AST 36  ALT 33  ALKPHOS 99  BILITOT 0.7  PROT 6.3*  ALBUMIN 3.3*   No results for input(s): LIPASE, AMYLASE in the last 168 hours. No results for input(s): AMMONIA in the last 168 hours. Coagulation Profile: No results for input(s): INR, PROTIME in the last 168 hours. CBC: Recent Labs  Lab 03/24/24 1110 03/25/24 0552  WBC 17.4* 14.5*  NEUTROABS 16.1*  --   HGB 9.9* 10.0*  HCT 27.0* 27.3*  MCV 85.4 86.1  PLT 406* 413*   Cardiac Enzymes: No results for input(s): CKTOTAL, CKMB, CKMBINDEX, TROPONINI in the last 168 hours. BNP: Invalid input(s): POCBNP CBG: No results for input(s): GLUCAP in the last 168 hours. HbA1C: Recent Labs    03/24/24 1723  HGBA1C 6.1*   Urine analysis:    Component Value Date/Time   COLORURINE YELLOW 03/24/2024 1038   APPEARANCEUR CLEAR 03/24/2024 1038   LABSPEC 1.020  03/24/2024 1038   PHURINE 6.0 03/24/2024 1038   GLUCOSEU NEGATIVE 03/24/2024 1038   HGBUR MODERATE (A) 03/24/2024 1038   BILIRUBINUR NEGATIVE 03/24/2024 1038   KETONESUR 5 (A) 03/24/2024 1038   PROTEINUR 30 (A) 03/24/2024 1038   NITRITE NEGATIVE 03/24/2024 1038   LEUKOCYTESUR NEGATIVE 03/24/2024 1038   Sepsis Labs: @LABRCNTIP (procalcitonin:4,lacticidven:4) ) Recent Results (from  the past 240 hours)  Resp panel by RT-PCR (RSV, Flu A&B, Covid) Anterior Nasal Swab     Status: None   Collection Time: 03/24/24 12:32 PM   Specimen: Anterior Nasal Swab  Result Value Ref Range Status   SARS Coronavirus 2 by RT PCR NEGATIVE NEGATIVE Final    Comment: (NOTE) SARS-CoV-2 target nucleic acids are NOT DETECTED.  The SARS-CoV-2 RNA is generally detectable in upper respiratory specimens during the acute phase of infection. The lowest concentration of SARS-CoV-2 viral copies this assay can detect is 138 copies/mL. A negative result does not preclude SARS-Cov-2 infection and should not be used as the sole basis for treatment or other patient management decisions. A negative result may occur with  improper specimen collection/handling, submission of specimen other than nasopharyngeal swab, presence of viral mutation(s) within the areas targeted by this assay, and inadequate number of viral copies(<138 copies/mL). A negative result must be combined with clinical observations, patient history, and epidemiological information. The expected result is Negative.  Fact Sheet for Patients:  bloggercourse.com  Fact Sheet for Healthcare Providers:  seriousbroker.it  This test is no t yet approved or cleared by the [place]  FDA and  has been authorized for detection and/or diagnosis of SARS-CoV-2 by FDA under an Emergency Use Authorization (EUA). This EUA will remain  in effect (meaning this test can be used) for the duration of the COVID-19  declaration under Section 564(b)(1) of the Act, 21 U.S.C.section 360bbb-3(b)(1), unless the authorization is terminated  or revoked sooner.       Influenza A by PCR NEGATIVE NEGATIVE Final   Influenza B by PCR NEGATIVE NEGATIVE Final    Comment: (NOTE) The Xpert Xpress SARS-CoV-2/FLU/RSV plus assay is intended as an aid in the diagnosis of influenza from Nasopharyngeal swab specimens and should not be used as a sole basis for treatment. Nasal washings and aspirates are unacceptable for Xpert Xpress SARS-CoV-2/FLU/RSV testing.  Fact Sheet for Patients: bloggercourse.com  Fact Sheet for Healthcare Providers: seriousbroker.it  This test is not yet approved or cleared by the [place]  FDA and has been authorized for detection and/or diagnosis of SARS-CoV-2 by FDA under an Emergency Use Authorization (EUA). This EUA will remain in effect (meaning this test can be used) for the duration of the COVID-19 declaration under Section 564(b)(1) of the Act, 21 U.S.C. section 360bbb-3(b)(1), unless the authorization is terminated or revoked.     Resp Syncytial Virus by PCR NEGATIVE NEGATIVE Final    Comment: (NOTE) Fact Sheet for Patients: bloggercourse.com  Fact Sheet for Healthcare Providers: seriousbroker.it  This test is not yet approved or cleared by the [place]  FDA and has been authorized for detection and/or diagnosis of SARS-CoV-2 by FDA under an Emergency Use Authorization (EUA). This EUA will remain in effect (meaning this test can be used) for the duration of the COVID-19 declaration under Section 564(b)(1) of the Act, 21 U.S.C. section 360bbb-3(b)(1), unless the authorization is terminated or revoked.  Performed at [facility], [address] [place], [place] [address]   MRSA Next Gen by PCR, Nasal     Status: None   Collection Time: 03/24/24  3:18 PM   Specimen:  Nasal Mucosa; Nasal Swab  Result Value Ref Range Status   MRSA by PCR Next Gen NOT DETECTED NOT DETECTED Final    Comment: (NOTE) The GeneXpert MRSA Assay (FDA approved for NASAL specimens only), is one component of a comprehensive MRSA colonization surveillance program. It is not intended to diagnose MRSA  infection nor to guide or monitor treatment for MRSA infections. Test performance is not FDA approved in patients less than 12 years old. Performed at [facility], [address], [place], [place] [address]   Culture, blood (Routine X 2) w Reflex to ID Panel     Status: None (Preliminary result)   Collection Time: 03/24/24  5:23 PM   Specimen: BLOOD  Result Value Ref Range Status   Specimen Description BLOOD BLOOD RIGHT WRIST  Final   Special Requests   Final    BOTTLES DRAWN AEROBIC AND ANAEROBIC Blood Culture adequate volume   Culture   Final    NO GROWTH < 24 HOURS Performed at [facility], [address], [place], [place] [address]    Report Status PENDING  Incomplete  Culture, blood (Routine X 2) w Reflex to ID Panel     Status: None (Preliminary result)   Collection Time: 03/24/24  5:23 PM   Specimen: BLOOD RIGHT HAND  Result Value Ref Range Status   Specimen Description BLOOD RIGHT HAND BLOOD  Final   Special Requests   Final    BOTTLES DRAWN AEROBIC AND ANAEROBIC Blood Culture adequate volume   Culture   Final    NO GROWTH < 24 HOURS Performed at [facility], [address] [place], [place] [address]    Report Status PENDING  Incomplete     Scheduled Meds:  Chlorhexidine  Gluconate Cloth  6 each Topical Q0600   heparin   5,000 Units Subcutaneous Q8H   metoprolol  tartrate  37.5 mg Oral BID   Continuous Infusions:  sodium chloride  (hypertonic) 30 mL/hr at 03/25/24 0805    Procedures/Studies: DG Chest 1 View Result Date: 03/24/2024 CLINICAL DATA:  Weakness and fall. EXAM: CHEST  1 VIEW COMPARISON:  None available. FINDINGS: The heart is mildly enlarged. No  pulmonary vascular congestion. Mediastinal silhouette within normal limits. Lungs are clear. Deformity of the lateral RIGHT clavicle suspicious for fracture. IMPRESSION: 1. Mild cardiomegaly. 2. Deformity of the lateral RIGHT clavicle suspicious for fracture. Please correlate for focal tenderness. Electronically Signed   By: Aliene Lloyd M.D.   On: 03/24/2024 14:04   DG Knee Complete 4 Views Right Result Date: 03/24/2024 EXAM: 4 VIEW(S) XRAY OF THE KNEE 03/24/2024 11:07:00 AM COMPARISON: None available. CLINICAL HISTORY: fall FINDINGS: BONES AND JOINTS: No acute fracture. No malalignment. No significant joint effusion. Mild lateral compartment joint space narrowing. Marginal osteophytosis and subchondral sclerosis of the lateral compartment. Additional subtle marginal osteophytosis of the medial compartment. SOFT TISSUES: Mild diffuse soft tissue swelling. IMPRESSION: 1. No acute fracture or dislocation. 2. Mild diffuse soft tissue swelling. 3. Lateral compartment predominant osteoarthritis. Electronically signed by: Donnice Mania MD 03/24/2024 11:27 AM EST RP Workstation: [id number]   CT Cervical Spine Wo Contrast Result Date: 03/24/2024 EXAM: CT CERVICAL SPINE WITHOUT CONTRAST 03/24/2024 10:59:54 AM TECHNIQUE: CT of the cervical spine was performed without the administration of intravenous contrast. Multiplanar reformatted images are provided for review. Automated exposure control, iterative reconstruction, and/or weight based adjustment of the mA/kV was utilized to reduce the radiation dose to as low as reasonably achievable. COMPARISON: None available. CLINICAL HISTORY: Neck trauma (Age >= 65y) FINDINGS: BONES AND ALIGNMENT: Straightening of the normal cervical lordosis. Trace degenerative anterolisthesis of C2 on C3 and C3 on C4. Additional trace degenerative anterolisthesis of C7 on T1. No acute fracture or traumatic malalignment. DEGENERATIVE CHANGES: Moderate disc space narrowing at C4-C5. Additional  disc space narrowing at C5-C6 and C6-C7. Mild degenerative endplate osteophytes at  multiple levels. There are disc osteophyte complexes at multiple levels without evidence of high grade osseous spinal canal stenosis. Facet arthrosis and uncovertebral hypertrophy at multiple levels. SOFT TISSUES: No prevertebral soft tissue swelling. LUNGS: Scarring and atelectasis in the right lung apex. IMPRESSION: 1. No evidence of acute traumatic injury. 2. Degenerative changes as above. Electronically signed by: Donnice Mania MD 03/24/2024 11:25 AM EST RP Workstation: [id number]   CT Head Wo Contrast Result Date: 03/24/2024 EXAM: CT HEAD WITHOUT CONTRAST 03/24/2024 10:59:54 AM TECHNIQUE: CT of the head was performed without the administration of intravenous contrast. Automated exposure control, iterative reconstruction, and/or weight based adjustment of the mA/kV was utilized to reduce the radiation dose to as low as reasonably achievable. COMPARISON: None available. CLINICAL HISTORY: Head trauma, minor (Age >= 65y). FINDINGS: BRAIN AND VENTRICLES: No acute hemorrhage. 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 Mild chronic microvascular ischemic change. No evidence of acute infarct. No hydrocephalus. No extra-axial collection. No mass effect or midline shift. ORBITS: Bilateral lens replacement noted. SINUSES: Trace right mastoid effusion. SOFT TISSUES AND SKULL: Right periorbital soft tissue swelling. No skull fracture. IMPRESSION: 1. No acute intracranial abnormality. 2. Right periorbital soft tissue swelling. 3. Focal encephalomalacia along the anterolateral left temporal lobe, suggestive of small remote infarct or sequelae of prior trauma. Electronically signed by: Donnice Mania MD 03/24/2024 11:18 AM EST RP Workstation: [id number]    Alm Schneider, DO  [facility]  If 7PM-7AM, please contact night-coverage [email or web address] Password  [email or web address] 03/25/2024, 11:08 AM   LOS: 1 day   "

## 2024-03-26 ENCOUNTER — Inpatient Hospital Stay (HOSPITAL_COMMUNITY)

## 2024-03-26 ENCOUNTER — Telehealth (HOSPITAL_COMMUNITY): Payer: Self-pay | Admitting: Pharmacy Technician

## 2024-03-26 ENCOUNTER — Other Ambulatory Visit (HOSPITAL_COMMUNITY): Payer: Self-pay

## 2024-03-26 DIAGNOSIS — E871 Hypo-osmolality and hyponatremia: Secondary | ICD-10-CM | POA: Diagnosis not present

## 2024-03-26 DIAGNOSIS — I4891 Unspecified atrial fibrillation: Secondary | ICD-10-CM | POA: Diagnosis not present

## 2024-03-26 DIAGNOSIS — I10 Essential (primary) hypertension: Secondary | ICD-10-CM | POA: Diagnosis not present

## 2024-03-26 LAB — SODIUM
Sodium: 119 mmol/L — CL (ref 135–145)
Sodium: 122 mmol/L — ABNORMAL LOW (ref 135–145)
Sodium: 121 mmol/L — ABNORMAL LOW (ref 135–145)
Sodium: 123 mmol/L — ABNORMAL LOW (ref 135–145)
Sodium: 124 mmol/L — ABNORMAL LOW (ref 135–145)

## 2024-03-26 LAB — BASIC METABOLIC PANEL WITH GFR
Anion gap: 13 (ref 5–15)
BUN: 11 mg/dL (ref 8–23)
CO2: 22 mmol/L (ref 22–32)
Calcium: 8 mg/dL — ABNORMAL LOW (ref 8.9–10.3)
Chloride: 89 mmol/L — ABNORMAL LOW (ref 98–111)
Creatinine, Ser: 0.67 mg/dL (ref 0.44–1.00)
GFR, Estimated: >60 mL/min
Glucose, Bld: 111 mg/dL — ABNORMAL HIGH (ref 70–99)
Potassium: 3.7 mmol/L (ref 3.5–5.1)
Sodium: 123 mmol/L — ABNORMAL LOW (ref 135–145)

## 2024-03-26 LAB — MAGNESIUM: Magnesium: 1.9 mg/dL (ref 1.7–2.4)

## 2024-03-26 MED ORDER — MAGNESIUM SULFATE 2 GM/50ML IV SOLN
2.0000 g | Freq: Once | INTRAVENOUS | Status: AC
  Administered 2024-03-26: 2 g via INTRAVENOUS
  Filled 2024-03-26: qty 50

## 2024-03-26 MED ORDER — DILTIAZEM HCL-DEXTROSE 125-5 MG/125ML-% IV SOLN (PREMIX)
5.0000 mg/h | INTRAVENOUS | Status: DC
  Administered 2024-03-26: 5 mg/h via INTRAVENOUS
  Filled 2024-03-26: qty 125

## 2024-03-26 MED ORDER — FUROSEMIDE 10 MG/ML IJ SOLN
60.0000 mg | Freq: Once | INTRAMUSCULAR | Status: AC
  Administered 2024-03-26: 60 mg via INTRAVENOUS
  Filled 2024-03-26: qty 6

## 2024-03-26 MED ORDER — DILTIAZEM LOAD VIA INFUSION
10.0000 mg | Freq: Once | INTRAVENOUS | Status: AC
  Administered 2024-03-26: 10 mg via INTRAVENOUS
  Filled 2024-03-26: qty 10

## 2024-03-26 MED ORDER — POTASSIUM CHLORIDE CRYS ER 20 MEQ PO TBCR
40.0000 meq | EXTENDED_RELEASE_TABLET | Freq: Once | ORAL | Status: AC
  Administered 2024-03-26: 40 meq via ORAL
  Filled 2024-03-26: qty 2

## 2024-03-26 MED ORDER — IPRATROPIUM-ALBUTEROL 0.5-2.5 (3) MG/3ML IN SOLN
3.0000 mL | Freq: Two times a day (BID) | RESPIRATORY_TRACT | Status: DC
  Administered 2024-03-26 – 2024-03-29 (×6): 3 mL via RESPIRATORY_TRACT
  Filled 2024-03-26 (×6): qty 3

## 2024-03-26 NOTE — Progress Notes (Signed)
 "          PROGRESS NOTE  Glenda Nguyen FMW:[id number] DOB: [birth year] DOA: 03/24/2024 PCP: Vick Lurie, FNP (Inactive)  Brief History:  89 year old female with a history of hypertension and hyperlipidemia presenting with a fall on the morning of 03/24/2024.  The patient states that she has had some nausea and decreased oral intake for at least the past week.  She was feeling a bit dizzy.  She was getting out of bed and sat on the edge of the bed.  She felt like she fell off the edge of the bed onto her right side.  She denied loss of consciousness.  She did hit her head.  She was weak and had difficulty getting off the floor.  She called her daughter-in-law who called the patient's grandson to come help her. The patient complains of pain in her right hand which has been chronic.  She went to see orthopedic surgery in October.  There were plans for operation for carpal tunnel.  She has some right shoulder pain. She denied any fevers, chills, headache, neck pain, chest pain, shortness breath, abdominal pain, Vomiting, diarrhea.  She denies any dysuria or hematuria.  She has not had any hematochezia or melena.  She denies any new medications.  She is a non-smoker.  She does not drink alcohol .  She denies illicit drug use.  In the ED, the patient was afebrile and hemodynamically stable with oxygen saturation 95% room air.  She was tachycardic in the 110s.  WBC 17.4, hemoglobin 9.9, platelets 406.  Sodium 110, potassium 4.3, bicarbonate 19, serum creatinine 0.62.  AST 36, ALT 33, alk phosphatase 99, total bilirubin 0.7.  Albumin is 3.3.  Magnesium  1.7.  EKG showed atrial fibrillation with nonspecific ST changes.  proBNP 2551  Chest x-ray was negative for any infiltrates or edema.  There was a deformity to right clavicle.  CT of the brain was negative for any acute findings.  There was right periorbital swelling of the soft tissues.  CT cervical spine was negative for any traumatic listhesis.   X-ray of the right knee was negative for fracture or dislocation.  The patient was given 2 L of fluid in the ED.  She was admitted for further evaluation and treatment of her hyponatremia.   Assessment/Plan: Hyponatremia -Clinically the patient appears to be hypervolemic - Na 110>>122 - She was alert and oriented x 3 - Urine osmolarity 600 - Serum osmolarity 236 - Urine sodium <30 - Urine creatinine 104 - she appears hypervolemic, but will hold off lasix  at least another 24 hours to avoid rapid rise in Na - continue hypertonic 3% saline for now - monitor Na q 4 hours   New onset atrial fibrillation with RVR - Patient states that she was told by her orthopedist about 2 months ago that she had atrial fibrillation - She has never followed up with her PCP - TSH 2.760 - 12/29 Echo- - CHADSVASC2 = 4 (Age x 2, HTN, female) - started apixaban  - started metoprolol  in lieu of coreg for HR control -03/26/24--started IV diltiazem    Essential hypertension - d/c coreg - start metoprolol  for rate control for afib   Leukemoid reaction - Blood cultures x 2 sets--neg to date - Obtain UA--0-5 WBC - Personally reviewed chest x-ray--no infiltrates - Check PCT 0.32 - remains afebrile and hemodynamically stable   Pre-Diabetes -12/27 A1C--6.1   LUE edema -venous duplex           Family  Communication:   no Family at bedside   Consultants:  none   Code Status:  FULL    DVT Prophylaxis: apixaban      Procedures: As Listed in Progress Note Above   Antibiotics: None        Subjective: Pt is feeling better.  Denies cp, sob, n/v/d, f/c  Objective: Vitals:   03/26/24 0351 03/26/24 0748 03/26/24 1124 03/26/24 1318  BP:    (!) 149/67  Pulse:    (!) 130  Resp:    18  Temp: 98.1 F (36.7 C) 97.9 F (36.6 C) 98.5 F (36.9 C)   TempSrc: Oral Axillary Oral   SpO2:  92%  96%  Weight:      Height:        Intake/Output Summary (Last 24 hours) at 03/26/2024 1351 Last data  filed at 03/26/2024 1318 Gross per 24 hour  Intake 908.21 ml  Output 4800 ml  Net -3891.79 ml   Weight change:  Exam:  General:  Pt is alert, follows commands appropriately, not in acute distress HEENT: No icterus, No thrush, No neck mass, [place]/AT Cardiovascular: IRRR, S1/S2, no rubs, no gallops Respiratory: CTA bilaterally, no wheezing, no crackles, no rhonchi Abdomen: Soft/+BS, non tender, non distended, no guarding Extremities: 1 + LE edema, No lymphangitis, No petechiae, No rashes, no synovitis   Data Reviewed: I have personally reviewed following labs and imaging studies Basic Metabolic Panel: Recent Labs  Lab 03/24/24 1110 03/24/24 1723 03/25/24 0552 03/25/24 1018 03/25/24 1827 03/25/24 2232 03/26/24 0252 03/26/24 0728 03/26/24 1122  NA 110*   < > 113*   < > 118* 118* 119* 122* 121*  K 4.3  --   --   --   --   --   --   --   --   CL 77*  --   --   --   --   --   --   --   --   CO2 19*  --   --   --   --   --   --   --   --   GLUCOSE 107*  --   --   --   --   --   --   --   --   BUN 15  --   --   --   --   --   --   --   --   CREATININE 0.62  --   --   --   --   --   --   --   --   CALCIUM 8.5*  --   --   --   --   --   --   --   --   MG 1.7  --  2.0  --   --   --   --   --   --    < > = values in this interval not displayed.   Liver Function Tests: Recent Labs  Lab 03/24/24 1110  AST 36  ALT 33  ALKPHOS 99  BILITOT 0.7  PROT 6.3*  ALBUMIN 3.3*   No results for input(s): LIPASE, AMYLASE in the last 168 hours. No results for input(s): AMMONIA in the last 168 hours. Coagulation Profile: No results for input(s): INR, PROTIME in the last 168 hours. CBC: Recent Labs  Lab 03/24/24 1110 03/25/24 0552  WBC 17.4* 14.5*  NEUTROABS 16.1*  --   HGB 9.9* 10.0*  HCT 27.0* 27.3*  MCV 85.4 86.1  PLT 406* 413*   Cardiac Enzymes: No results for input(s): CKTOTAL, CKMB, CKMBINDEX, TROPONINI in the last 168 hours. BNP: Invalid input(s):  POCBNP CBG: No results for input(s): GLUCAP in the last 168 hours. HbA1C: Recent Labs    03/24/24 1723  HGBA1C 6.1*   Urine analysis:    Component Value Date/Time   COLORURINE YELLOW 03/24/2024 1038   APPEARANCEUR CLEAR 03/24/2024 1038   LABSPEC 1.020 03/24/2024 1038   PHURINE 6.0 03/24/2024 1038   GLUCOSEU NEGATIVE 03/24/2024 1038   HGBUR MODERATE (A) 03/24/2024 1038   BILIRUBINUR NEGATIVE 03/24/2024 1038   KETONESUR 5 (A) 03/24/2024 1038   PROTEINUR 30 (A) 03/24/2024 1038   NITRITE NEGATIVE 03/24/2024 1038   LEUKOCYTESUR NEGATIVE 03/24/2024 1038   Sepsis Labs: @LABRCNTIP (procalcitonin:4,lacticidven:4) ) Recent Results (from the past 240 hours)  Resp panel by RT-PCR (RSV, Flu A&B, Covid) Anterior Nasal Swab     Status: None   Collection Time: 03/24/24 12:32 PM   Specimen: Anterior Nasal Swab  Result Value Ref Range Status   SARS Coronavirus 2 by RT PCR NEGATIVE NEGATIVE Final    Comment: (NOTE) SARS-CoV-2 target nucleic acids are NOT DETECTED.  The SARS-CoV-2 RNA is generally detectable in upper respiratory specimens during the acute phase of infection. The lowest concentration of SARS-CoV-2 viral copies this assay can detect is 138 copies/mL. A negative result does not preclude SARS-Cov-2 infection and should not be used as the sole basis for treatment or other patient management decisions. A negative result may occur with  improper specimen collection/handling, submission of specimen other than nasopharyngeal swab, presence of viral mutation(s) within the areas targeted by this assay, and inadequate number of viral copies(<138 copies/mL). A negative result must be combined with clinical observations, patient history, and epidemiological information. The expected result is Negative.  Fact Sheet for Patients:  bloggercourse.com  Fact Sheet for Healthcare Providers:  seriousbroker.it  This test is no t yet  approved or cleared by the [place]  FDA and  has been authorized for detection and/or diagnosis of SARS-CoV-2 by FDA under an Emergency Use Authorization (EUA). This EUA will remain  in effect (meaning this test can be used) for the duration of the COVID-19 declaration under Section 564(b)(1) of the Act, 21 U.S.C.section 360bbb-3(b)(1), unless the authorization is terminated  or revoked sooner.       Influenza A by PCR NEGATIVE NEGATIVE Final   Influenza B by PCR NEGATIVE NEGATIVE Final    Comment: (NOTE) The Xpert Xpress SARS-CoV-2/FLU/RSV plus assay is intended as an aid in the diagnosis of influenza from Nasopharyngeal swab specimens and should not be used as a sole basis for treatment. Nasal washings and aspirates are unacceptable for Xpert Xpress SARS-CoV-2/FLU/RSV testing.  Fact Sheet for Patients: bloggercourse.com  Fact Sheet for Healthcare Providers: seriousbroker.it  This test is not yet approved or cleared by the [place]  FDA and has been authorized for detection and/or diagnosis of SARS-CoV-2 by FDA under an Emergency Use Authorization (EUA). This EUA will remain in effect (meaning this test can be used) for the duration of the COVID-19 declaration under Section 564(b)(1) of the Act, 21 U.S.C. section 360bbb-3(b)(1), unless the authorization is terminated or revoked.     Resp Syncytial Virus by PCR NEGATIVE NEGATIVE Final    Comment: (NOTE) Fact Sheet for Patients: bloggercourse.com  Fact Sheet for Healthcare Providers: seriousbroker.it  This test is not yet approved or cleared by the [place]  FDA and has been authorized for detection and/or  diagnosis of SARS-CoV-2 by FDA under an Emergency Use Authorization (EUA). This EUA will remain in effect (meaning this test can be used) for the duration of the COVID-19 declaration under Section 564(b)(1) of  the Act, 21 U.S.C. section 360bbb-3(b)(1), unless the authorization is terminated or revoked.  Performed at [facility], [address], [place], [place] [address]   MRSA Next Gen by PCR, Nasal     Status: None   Collection Time: 03/24/24  3:18 PM   Specimen: Nasal Mucosa; Nasal Swab  Result Value Ref Range Status   MRSA by PCR Next Gen NOT DETECTED NOT DETECTED Final    Comment: (NOTE) The GeneXpert MRSA Assay (FDA approved for NASAL specimens only), is one component of a comprehensive MRSA colonization surveillance program. It is not intended to diagnose MRSA infection nor to guide or monitor treatment for MRSA infections. Test performance is not FDA approved in patients less than 69 years old. Performed at [facility], [address] [place], [place] [address]   Culture, blood (Routine X 2) w Reflex to ID Panel     Status: None (Preliminary result)   Collection Time: 03/24/24  5:23 PM   Specimen: BLOOD  Result Value Ref Range Status   Specimen Description BLOOD BLOOD RIGHT WRIST  Final   Special Requests   Final    BOTTLES DRAWN AEROBIC AND ANAEROBIC Blood Culture adequate volume   Culture   Final    NO GROWTH 2 DAYS Performed at [facility], [address], [place], [place] [address]    Report Status PENDING  Incomplete  Culture, blood (Routine X 2) w Reflex to ID Panel     Status: None (Preliminary result)   Collection Time: 03/24/24  5:23 PM   Specimen: BLOOD RIGHT HAND  Result Value Ref Range Status   Specimen Description BLOOD RIGHT HAND BLOOD  Final   Special Requests   Final    BOTTLES DRAWN AEROBIC AND ANAEROBIC Blood Culture adequate volume   Culture   Final    NO GROWTH 2 DAYS Performed at [facility], [address] [place], [place] [address]    Report Status PENDING  Incomplete     Scheduled Meds:  apixaban   5 mg Oral BID   budesonide  (PULMICORT ) nebulizer solution  0.5 mg Nebulization BID   Chlorhexidine  Gluconate Cloth  6 each Topical Q0600    diltiazem   10 mg Intravenous Once   ipratropium-albuterol   3 mL Nebulization BID   metoprolol  tartrate  37.5 mg Oral BID   Continuous Infusions:  diltiazem  (CARDIZEM ) infusion 5 mg/hr (03/26/24 1351)   sodium chloride  (hypertonic) 30 mL/hr at 03/26/24 1200    Procedures/Studies: ECHOCARDIOGRAM COMPLETE Result Date: 03/25/2024    ECHOCARDIOGRAM REPORT   Patient Name:   Glenda Nguyen Date of Exam: 03/25/2024 Medical Rec #:  [id number]           Height:       67.0 in Accession #:    [id number]          Weight:       188.3 lb Date of Birth:  13-Dec-1934           BSA:          1.971 m Patient Age:    89 years            BP:           126/57 mmHg Patient Gender: F  HR:           102 bpm. Exam Location:  Inpatient Procedure: 2D Echo, Cardiac Doppler and Color Doppler (Both Spectral and Color            Flow Doppler were utilized during procedure). Indications:    Atrial fibrillation  History:        Patient has no prior history of Echocardiogram examinations.                 Arrythmias:Atrial Fibrillation; Risk Factors:Hypertension.  Sonographer:    Philomena Daring Referring Phys: [email or web address] Sonda Coppens IMPRESSIONS  1. Left ventricular ejection fraction, by estimation, is 65 to 70%. The left ventricle has normal function. The left ventricle has no regional wall motion abnormalities. There is mild concentric left ventricular hypertrophy. Left ventricular diastolic parameters are indeterminate.  2. Right ventricular systolic function is normal. The right ventricular size is normal.  3. The mitral valve is normal in structure. No evidence of mitral valve regurgitation. No evidence of mitral stenosis.  4. The aortic valve was not well visualized. There is mild calcification of the aortic valve. Aortic valve regurgitation is mild. Aortic valve sclerosis is present, with no evidence of aortic valve stenosis.  5. The inferior vena cava is normal in size with greater than 50% respiratory variability,  suggesting right atrial pressure of 3 mmHg. Comparison(s): No prior Echocardiogram. FINDINGS  Left Ventricle: Left ventricular ejection fraction, by estimation, is 65 to 70%. The left ventricle has normal function. The left ventricle has no regional wall motion abnormalities. The left ventricular internal cavity size was normal in size. There is  mild concentric left ventricular hypertrophy. Left ventricular diastolic parameters are indeterminate. Right Ventricle: The right ventricular size is normal. No increase in right ventricular wall thickness. Right ventricular systolic function is normal. Left Atrium: Left atrial size was normal in size. Right Atrium: Right atrial size was normal in size. Pericardium: There is no evidence of pericardial effusion. Mitral Valve: The mitral valve is normal in structure. No evidence of mitral valve regurgitation. No evidence of mitral valve stenosis. Tricuspid Valve: The tricuspid valve is normal in structure. Tricuspid valve regurgitation is mild . No evidence of tricuspid stenosis. Aortic Valve: The aortic valve was not well visualized. There is mild calcification of the aortic valve. Aortic valve regurgitation is mild. Aortic regurgitation PHT measures 465 msec. Aortic valve sclerosis is present, with no evidence of aortic valve stenosis. Pulmonic Valve: The pulmonic valve was normal in structure. Pulmonic valve regurgitation is mild. No evidence of pulmonic stenosis. Aorta: The aortic root and ascending aorta are structurally normal, with no evidence of dilitation. Venous: The inferior vena cava is normal in size with greater than 50% respiratory variability, suggesting right atrial pressure of 3 mmHg. IAS/Shunts: No atrial level shunt detected by color flow Doppler.  LEFT VENTRICLE PLAX 2D LVIDd:         3.90 cm   Diastology LVIDs:         2.60 cm   LV e' medial:    10.23 cm/s LV PW:         1.10 cm   LV E/e' medial:  9.5 LV IVS:        1.10 cm   LV e' lateral:   14.80 cm/s  LVOT diam:     1.80 cm   LV E/e' lateral: 6.5 LV SV:         42 LV SV Index:   22 LVOT Area:  2.54 cm  RIGHT VENTRICLE             IVC RV Basal diam:  2.90 cm     IVC diam: 1.40 cm RV Mid diam:    2.20 cm RV S prime:     14.00 cm/s TAPSE (M-mode): 2.2 cm LEFT ATRIUM             Index        RIGHT ATRIUM           Index LA diam:        3.30 cm 1.67 cm/m   RA Area:     15.70 cm LA Vol (A2C):   46.8 ml 23.74 ml/m  RA Volume:   37.00 ml  18.77 ml/m LA Vol (A4C):   42.7 ml 21.66 ml/m LA Biplane Vol: 48.7 ml 24.71 ml/m  AORTIC VALVE LVOT Vmax:   129.33 cm/s LVOT Vmean:  83.367 cm/s LVOT VTI:    0.167 m AI PHT:      465 msec  AORTA Ao Root diam: 3.20 cm Ao Asc diam:  3.20 cm MITRAL VALVE               TRICUSPID VALVE MV Area (PHT): 4.67 cm    TR Peak grad:   30.9 mmHg MV Decel Time: 163 msec    TR Vmax:        278.00 cm/s MV E velocity: 96.90 cm/s                            SHUNTS                            Systemic VTI:  0.17 m                            Systemic Diam: 1.80 cm Stanly Leavens MD Electronically signed by Stanly Leavens MD Signature Date/Time: 03/25/2024/12:13:57 PM    Final    DG Chest 1 View Result Date: 03/24/2024 CLINICAL DATA:  Weakness and fall. EXAM: CHEST  1 VIEW COMPARISON:  None available. FINDINGS: The heart is mildly enlarged. No pulmonary vascular congestion. Mediastinal silhouette within normal limits. Lungs are clear. Deformity of the lateral RIGHT clavicle suspicious for fracture. IMPRESSION: 1. Mild cardiomegaly. 2. Deformity of the lateral RIGHT clavicle suspicious for fracture. Please correlate for focal tenderness. Electronically Signed   By: Aliene Lloyd M.D.   On: 03/24/2024 14:04   DG Knee Complete 4 Views Right Result Date: 03/24/2024 EXAM: 4 VIEW(S) XRAY OF THE KNEE 03/24/2024 11:07:00 AM COMPARISON: None available. CLINICAL HISTORY: fall FINDINGS: BONES AND JOINTS: No acute fracture. No malalignment. No significant joint effusion. Mild lateral  compartment joint space narrowing. Marginal osteophytosis and subchondral sclerosis of the lateral compartment. Additional subtle marginal osteophytosis of the medial compartment. SOFT TISSUES: Mild diffuse soft tissue swelling. IMPRESSION: 1. No acute fracture or dislocation. 2. Mild diffuse soft tissue swelling. 3. Lateral compartment predominant osteoarthritis. Electronically signed by: Donnice Mania MD 03/24/2024 11:27 AM EST RP Workstation: [id number]   CT Cervical Spine Wo Contrast Result Date: 03/24/2024 EXAM: CT CERVICAL SPINE WITHOUT CONTRAST 03/24/2024 10:59:54 AM TECHNIQUE: CT of the cervical spine was performed without the administration of intravenous contrast. Multiplanar reformatted images are provided for review. Automated exposure control, iterative reconstruction, and/or weight based adjustment of the mA/kV was utilized to reduce the radiation dose  to as low as reasonably achievable. COMPARISON: None available. CLINICAL HISTORY: Neck trauma (Age >= 65y) FINDINGS: BONES AND ALIGNMENT: Straightening of the normal cervical lordosis. Trace degenerative anterolisthesis of C2 on C3 and C3 on C4. Additional trace degenerative anterolisthesis of C7 on T1. No acute fracture or traumatic malalignment. DEGENERATIVE CHANGES: Moderate disc space narrowing at C4-C5. Additional disc space narrowing at C5-C6 and C6-C7. Mild degenerative endplate osteophytes at multiple levels. There are disc osteophyte complexes at multiple levels without evidence of high grade osseous spinal canal stenosis. Facet arthrosis and uncovertebral hypertrophy at multiple levels. SOFT TISSUES: No prevertebral soft tissue swelling. LUNGS: Scarring and atelectasis in the right lung apex. IMPRESSION: 1. No evidence of acute traumatic injury. 2. Degenerative changes as above. Electronically signed by: Donnice Mania MD 03/24/2024 11:25 AM EST RP Workstation: [id number]   CT Head Wo Contrast Result Date: 03/24/2024 EXAM: CT HEAD WITHOUT  CONTRAST 03/24/2024 10:59:54 AM TECHNIQUE: CT of the head was performed without the administration of intravenous contrast. Automated exposure control, iterative reconstruction, and/or weight based adjustment of the mA/kV was utilized to reduce the radiation dose to as low as reasonably achievable. COMPARISON: None available. CLINICAL HISTORY: Head trauma, minor (Age >= 65y). FINDINGS: BRAIN AND VENTRICLES: No acute hemorrhage. 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 Mild chronic microvascular ischemic change. No evidence of acute infarct. No hydrocephalus. No extra-axial collection. No mass effect or midline shift. ORBITS: Bilateral lens replacement noted. SINUSES: Trace right mastoid effusion. SOFT TISSUES AND SKULL: Right periorbital soft tissue swelling. No skull fracture. IMPRESSION: 1. No acute intracranial abnormality. 2. Right periorbital soft tissue swelling. 3. Focal encephalomalacia along the anterolateral left temporal lobe, suggestive of small remote infarct or sequelae of prior trauma. Electronically signed by: Donnice Mania MD 03/24/2024 11:18 AM EST RP Workstation: [id number]    Alm Schneider, DO  [facility]  If 7PM-7AM, please contact night-coverage [email or web address] Password [email or web address] 03/26/2024, 1:51 PM   LOS: 2 days   "

## 2024-03-26 NOTE — Progress Notes (Signed)
 Nephrology Consult   Assessment/Recommendations:   Severe Acute on Chronic Hyponatremia, improving -starting sodium 12/27 was 110, started on 3% since then with appropriate rise in Na, now 122 -volume status: hypervolemic -studies suggest a component of SIADH and hypervolemia -continue with 3% at 30cc/hr for now -lasix  60mg  IV x 1 dose -continue with serial sodium monitoring, avoid overcorrection to prevent osmotic demyelination syndrome.  Target sodium by tomorrow morning should be around 128.  Hold 3% if sodium rapidly increases to goal after Lasix  administration  Edema -lasix  plan as above -monitor strict I/O, daily weights  New onset Afib -mgmt per primary service -on eliquis  and metoprolol   HTN -BP acceptable, monitor for now, consider amlodipine if needed  Anemia -transfuse prn for hgb <7  Recommendations conveyed to primary service.   Ephriam Stank [facility] 03/26/2024 8:19 AM   _____________________________________________________________________________________   History of Present Illness: Glenda Nguyen is a/an 89 y.o. female with a past medical history of hypertension, hyperlipidemia who presents to [facility] with fall.  For about the last week or so, patient had reported some nausea and decreased p.o. intake.  Associated with dizziness.  Regards to her fall, denied any loss of consciousness and hit her head.  Had difficulty getting off the floor.  In the ER, was found to have white count of 17.4, hemoglobin 9.9, sodium 110.  CT head was negative for any acute findings along with her CT C-spine.  She was given 2 L of isotonic fluids in the ER and her sodium did not budge from 110.  2D echo performed yesterday in the context of her being hypervolemic which showed an EF of 65 to 70%, mild LVH, normal systolic function, suggested right atrial pressure of 3 mmHg. She reports feeling better, she is worried about her BP being on the higher side especially  when she tries to move. She reports that she has been having swelling since the beginning of December. Has been strict about her sodium in her diet and has been trying to avoid sodium as much as possible. She otherwise denies any chest pain, SOB, dizziness, blurry vision, HA's.  Medications:  Current Facility-Administered Medications  Medication Dose Route Frequency Provider Last Rate Last Admin   acetaminophen  (TYLENOL ) tablet 650 mg  650 mg Oral Q6H PRN Tat, David, MD       Or   acetaminophen  (TYLENOL ) suppository 650 mg  650 mg Rectal Q6H PRN Tat, Alm, MD       apixaban  (ELIQUIS ) tablet 5 mg  5 mg Oral BID Tat, David, MD   5 mg at 03/25/24 2253   budesonide  (PULMICORT ) nebulizer solution 0.5 mg  0.5 mg Nebulization BID Tat, Alm, MD   0.5 mg at 03/26/24 9251   Chlorhexidine  Gluconate Cloth 2 % PADS 6 each  6 each Topical Q0600 Tat, Alm, MD   6 each at 03/26/24 0528   HYDROcodone -acetaminophen  (NORCO/VICODIN) 5-325 MG per tablet 1 tablet  1 tablet Oral Q6H PRN Tat, Alm, MD   1 tablet at 03/25/24 2025   ipratropium-albuterol  (DUONEB) 0.5-2.5 (3) MG/3ML nebulizer solution 3 mL  3 mL Nebulization Q8H Tat, Alm, MD   3 mL at 03/26/24 9251   metoprolol  tartrate (LOPRESSOR ) tablet 37.5 mg  37.5 mg Oral BID Evonnie Alm, MD   37.5 mg at 03/26/24 9374   ondansetron  (ZOFRAN ) tablet 4 mg  4 mg Oral Q6H PRN Tat, Alm, MD       Or   ondansetron  (ZOFRAN ) injection 4 mg  4 mg Intravenous Q6H PRN Tat, Alm, MD       sodium chloride  (hypertonic) 3 % solution   Intravenous Continuous Tat, David, MD 30 mL/hr at 03/26/24 0155 New Bag at 03/26/24 0155     ALLERGIES Penicillins  MEDICAL HISTORY Past Medical History:  Diagnosis Date   High cholesterol    Hypertension    Urticaria      SOCIAL HISTORY Social History   Socioeconomic History   Marital status: Widowed    Spouse name: Not on file   Number of children: Not on file   Years of education: Not on file   Highest education level: Not  on file  Occupational History   Not on file  Tobacco Use   Smoking status: Never   Smokeless tobacco: Never  Vaping Use   Vaping status: Never Used  Substance and Sexual Activity   Alcohol  use: Never   Drug use: Never   Sexual activity: Not on file  Other Topics Concern   Not on file  Social History Narrative   Not on file   Social Drivers of Health   Tobacco Use: Low Risk (03/24/2024)   Patient History    Smoking Tobacco Use: Never    Smokeless Tobacco Use: Never    Passive Exposure: Not on file  Financial Resource Strain: Not on file  Food Insecurity: No Food Insecurity (03/24/2024)   Epic    Worried About Programme Researcher, Broadcasting/film/video in the Last Year: Never true    Ran Out of Food in the Last Year: Never true  Transportation Needs: No Transportation Needs (03/24/2024)   Epic    Lack of Transportation (Medical): No    Lack of Transportation (Non-Medical): No  Physical Activity: Not on file  Stress: Not on file  Social Connections: Unknown (03/24/2024)   Social Connection and Isolation Panel    Frequency of Communication with Friends and Family: More than three times a week    Frequency of Social Gatherings with Friends and Family: More than three times a week    Attends Religious Services: 1 to 4 times per year    Active Member of [employer] or Organizations: No    Attends Banker Meetings: Never    Marital Status: Patient declined  Catering Manager Violence: Not At Risk (03/24/2024)   Epic    Fear of Current or Ex-Partner: No    Emotionally Abused: No    Physically Abused: No    Sexually Abused: No  Depression (PHQ2-9): Not on file  Alcohol  Screen: Not on file  Housing: Low Risk (03/24/2024)   Epic    Unable to Pay for Housing in the Last Year: No    Number of Times Moved in the Last Year: 0    Homeless in the Last Year: No  Utilities: Not At Risk (03/24/2024)   Epic    Threatened with loss of utilities: No  Health Literacy: Not on file     FAMILY  HISTORY Family History  Problem Relation Age of Onset   Allergic rhinitis Neg Hx    Asthma Neg Hx    Eczema Neg Hx    Urticaria Neg Hx      Review of Systems: 12 systems reviewed Otherwise as per HPI, all other systems reviewed and negative  Physical Exam: Vitals:   03/26/24 0351 03/26/24 0748  BP:    Pulse:    Resp:    Temp: 98.1 F (36.7 C) 97.9 F (36.6 C)  SpO2:  92%   Total I/O In: -  Out: 250 [Urine:250]  Intake/Output Summary (Last 24 hours) at 03/26/2024 9180 Last data filed at 03/26/2024 9197 Gross per 24 hour  Intake 267.02 ml  Output 1050 ml  Net -782.98 ml   General: well-appearing, no acute distress HEENT: anicteric sclera, oropharynx clear without lesions CV: regular rate, normal rhythm, no murmurs, no gallops, no rubs, 2+ peripheral edema b/l Les, trace edema UE's Lungs: clear to auscultation bilaterally, normal work of breathing Abd: soft, non-tender, non-distended Skin: no visible lesions or rashes Psych: alert, engaged, appropriate mood and affect Musculoskeletal: no obvious deformities Neuro: normal speech, no gross focal deficits   Test Results Reviewed Lab Results  Component Value Date   NA 122 (L) 03/26/2024   K 4.3 03/24/2024   CL 77 (L) 03/24/2024   CO2 19 (L) 03/24/2024   BUN 15 03/24/2024   CREATININE 0.62 03/24/2024   CALCIUM 8.5 (L) 03/24/2024   ALBUMIN 3.3 (L) 03/24/2024     I have reviewed all relevant outside healthcare records related to the patient's kidney injury.

## 2024-03-26 NOTE — Plan of Care (Signed)

## 2024-03-26 NOTE — Telephone Encounter (Signed)
 Patient Product/process development scientist completed.    The patient is insured through [employer]. Patient has Medicare and is not eligible for a copay card, but may be able to apply for patient assistance or Medicare RX Payment Plan (Patient Must reach out to their plan, if eligible for payment plan), if available.    Ran test claim for Eliquis 5 mg and the current 30 day co-pay is $12.15.   This test claim was processed through [facility]- copay amounts may vary at other pharmacies due to pharmacy/plan contracts, or as the patient moves through the different stages of their insurance plan.     Reyes Sharps, CPHT Pharmacy Technician Patient Advocate Specialist Lead [facility] Patient Advocate Team Direct Number: [phone]  Fax: [phone]

## 2024-03-27 ENCOUNTER — Ambulatory Visit (HOSPITAL_COMMUNITY): Payer: Self-pay | Admitting: Pharmacist

## 2024-03-27 DIAGNOSIS — I5031 Acute diastolic (congestive) heart failure: Secondary | ICD-10-CM

## 2024-03-27 LAB — BASIC METABOLIC PANEL WITH GFR
Anion gap: 5 (ref 5–15)
BUN: 13 mg/dL (ref 8–23)
CO2: 29 mmol/L (ref 22–32)
Calcium: 7.9 mg/dL — ABNORMAL LOW (ref 8.9–10.3)
Chloride: 93 mmol/L — ABNORMAL LOW (ref 98–111)
Creatinine, Ser: 0.62 mg/dL (ref 0.44–1.00)
GFR, Estimated: >60 mL/min
Glucose, Bld: 90 mg/dL (ref 70–99)
Potassium: 4.4 mmol/L (ref 3.5–5.1)
Sodium: 126 mmol/L — ABNORMAL LOW (ref 135–145)

## 2024-03-27 LAB — SODIUM
Sodium: 127 mmol/L — ABNORMAL LOW (ref 135–145)
Sodium: 126 mmol/L — ABNORMAL LOW (ref 135–145)
Sodium: 125 mmol/L — ABNORMAL LOW (ref 135–145)
Sodium: 127 mmol/L — ABNORMAL LOW (ref 135–145)
Sodium: 124 mmol/L — ABNORMAL LOW (ref 135–145)

## 2024-03-27 LAB — MAGNESIUM: Magnesium: 2.4 mg/dL (ref 1.7–2.4)

## 2024-03-27 MED ORDER — DILTIAZEM HCL 30 MG PO TABS
30.0000 mg | ORAL_TABLET | Freq: Four times a day (QID) | ORAL | Status: DC
  Administered 2024-03-27 – 2024-03-31 (×18): 30 mg via ORAL
  Filled 2024-03-27 (×18): qty 1

## 2024-03-27 MED ORDER — FUROSEMIDE 40 MG PO TABS
40.0000 mg | ORAL_TABLET | Freq: Once | ORAL | Status: AC
  Administered 2024-03-27: 40 mg via ORAL
  Filled 2024-03-27: qty 1

## 2024-03-27 MED ORDER — METOPROLOL TARTRATE 25 MG PO TABS
25.0000 mg | ORAL_TABLET | Freq: Two times a day (BID) | ORAL | Status: DC
  Administered 2024-03-27 – 2024-03-31 (×9): 25 mg via ORAL
  Filled 2024-03-27 (×9): qty 1

## 2024-03-27 MED ORDER — FUROSEMIDE 40 MG PO TABS: 40.0000 mg | ORAL_TABLET | Freq: Every day | ORAL | Status: DC

## 2024-03-27 NOTE — Plan of Care (Signed)

## 2024-03-27 NOTE — Progress Notes (Signed)
 "          PROGRESS NOTE  Glenda Nguyen FMW:[id number] DOB: 03-19-1935 DOA: 03/24/2024 PCP: Vick Lurie, FNP (Inactive)  Brief History:  89 year old female with a history of hypertension and hyperlipidemia presenting with a fall on the morning of 03/24/2024.  The patient states that she has had some nausea and decreased oral intake for at least the past week.  She was feeling a bit dizzy.  She was getting out of bed and sat on the edge of the bed.  She felt like she fell off the edge of the bed onto her right side.  She denied loss of consciousness.  She did hit her head.  She was weak and had difficulty getting off the floor.  She called her daughter-in-law who called the patient's grandson to come help her. The patient complains of pain in her right hand which has been chronic.  She went to see orthopedic surgery in October.  There were plans for operation for carpal tunnel.  She has some right shoulder pain. She denied any fevers, chills, headache, neck pain, chest pain, shortness breath, abdominal pain, Vomiting, diarrhea.  She denies any dysuria or hematuria.  She has not had any hematochezia or melena.  She denies any new medications.  She is a non-smoker.  She does not drink alcohol .  She denies illicit drug use.  In the ED, the patient was afebrile and hemodynamically stable with oxygen saturation 95% room air.  She was tachycardic in the 110s.  WBC 17.4, hemoglobin 9.9, platelets 406.  Sodium 110, potassium 4.3, bicarbonate 19, serum creatinine 0.62.  AST 36, ALT 33, alk phosphatase 99, total bilirubin 0.7.  Albumin is 3.3.  Magnesium  1.7.  EKG showed atrial fibrillation with nonspecific ST changes.  proBNP 2551  Chest x-ray was negative for any infiltrates or edema.  There was a deformity to right clavicle.  CT of the brain was negative for any acute findings.  There was right periorbital swelling of the soft tissues.  CT cervical spine was negative for any traumatic listhesis.   X-ray of the right knee was negative for fracture or dislocation.  The patient was given 2 L of fluid in the ED.  She was admitted for further evaluation and treatment of her hyponatremia.   Assessment/Plan: Hyponatremia -Clinically the patient appears to be hypervolemic - Na 110>>122>>126 - She was alert and oriented x 3 - Urine osmolarity 600 - Serum osmolarity 236 - Urine sodium <30 - Urine creatinine 104 - lasix  40 mg IV x 1 on 12/30 - started on hypertonic 3% saline--stop on 12/30 - monitor Na q 4 hours  Fluid Overload/Acute HFpEF - 12/28 Echo--EF 65-70%, no WMA, normal RVF, mild TR - start IV lasix  with ok with nephrology   New onset atrial fibrillation with RVR - Patient states that she was told by her orthopedist about 2 months ago that she had atrial fibrillation - She has never followed up with her PCP - TSH 2.760 - 12/28 Echo--EF 65-70%, no WMA, normal RVF, mild TR - CHADSVASC2 = 4 (Age x 2, HTN, female) - started apixaban  -03/26/24--started IV diltiazem >>transition to po diltiazem    Essential hypertension - d/c coreg - start metoprolol  for rate control for afib   Leukemoid reaction - Blood cultures x 2 sets--neg to date - Obtain UA--0-5 WBC - Personally reviewed chest x-ray--no infiltrates - Check PCT 0.32 - remains afebrile and hemodynamically stable   Pre-Diabetes -12/27 A1C--6.1   LUE edema -venous  duplex           Family Communication:   no Family at bedside   Consultants:  none   Code Status:  FULL    DVT Prophylaxis: apixaban      Procedures: As Listed in Progress Note Above   Antibiotics: None         Subjective: Patient denies fevers, chills, headache, chest pain, dyspnea, nausea, vomiting, diarrhea, abdominal pain, dysuria, hematuria, hematochezia, and melena.   Objective: Vitals:   03/27/24 0450 03/27/24 0500 03/27/24 0600 03/27/24 0623  BP:  (!) 141/78 (!) 147/76   Pulse:  65    Resp: 15 20 18    Temp: 98.5 F (36.9  C)     TempSrc: Oral     SpO2:  96%  96%  Weight:      Height:        Intake/Output Summary (Last 24 hours) at 03/27/2024 0655 Last data filed at 03/27/2024 9374 Gross per 24 hour  Intake 1742.06 ml  Output 4750 ml  Net -3007.94 ml   Weight change:  Exam:  General:  Pt is alert, follows commands appropriately, not in acute distress HEENT: No icterus, No thrush, No neck mass, [place]/AT Cardiovascular: IRRR, S1/S2, no rubs, no gallops Respiratory: CTA bilaterally, no wheezing, no crackles, no rhonchi Abdomen: Soft/+BS, non tender, non distended, no guarding Extremities: 1 + LE edema, No lymphangitis, No petechiae, No rashes, no synovitis   Data Reviewed: I have personally reviewed following labs and imaging studies Basic Metabolic Panel: Recent Labs  Lab 03/24/24 1110 03/24/24 1723 03/25/24 0552 03/25/24 1018 03/26/24 1122 03/26/24 1459 03/26/24 1908 03/26/24 2301 03/27/24 0303  NA 110*   < > 113*   < > 121* 123* 123* 124* 126*  K 4.3  --   --   --   --  3.7  --   --  4.4  CL 77*  --   --   --   --  89*  --   --  93*  CO2 19*  --   --   --   --  22  --   --  29  GLUCOSE 107*  --   --   --   --  111*  --   --  90  BUN 15  --   --   --   --  11  --   --  13  CREATININE 0.62  --   --   --   --  0.67  --   --  0.62  CALCIUM 8.5*  --   --   --   --  8.0*  --   --  7.9*  MG 1.7  --  2.0  --   --  1.9  --   --  2.4   < > = values in this interval not displayed.   Liver Function Tests: Recent Labs  Lab 03/24/24 1110  AST 36  ALT 33  ALKPHOS 99  BILITOT 0.7  PROT 6.3*  ALBUMIN 3.3*   No results for input(s): LIPASE, AMYLASE in the last 168 hours. No results for input(s): AMMONIA in the last 168 hours. Coagulation Profile: No results for input(s): INR, PROTIME in the last 168 hours. CBC: Recent Labs  Lab 03/24/24 1110 03/25/24 0552  WBC 17.4* 14.5*  NEUTROABS 16.1*  --   HGB 9.9* 10.0*  HCT 27.0* 27.3*  MCV 85.4 86.1  PLT 406* 413*   Cardiac  Enzymes: No results for  input(s): CKTOTAL, CKMB, CKMBINDEX, TROPONINI in the last 168 hours. BNP: Invalid input(s): POCBNP CBG: No results for input(s): GLUCAP in the last 168 hours. HbA1C: Recent Labs    03/24/24 1723  HGBA1C 6.1*   Urine analysis:    Component Value Date/Time   COLORURINE YELLOW 03/24/2024 1038   APPEARANCEUR CLEAR 03/24/2024 1038   LABSPEC 1.020 03/24/2024 1038   PHURINE 6.0 03/24/2024 1038   GLUCOSEU NEGATIVE 03/24/2024 1038   HGBUR MODERATE (A) 03/24/2024 1038   BILIRUBINUR NEGATIVE 03/24/2024 1038   KETONESUR 5 (A) 03/24/2024 1038   PROTEINUR 30 (A) 03/24/2024 1038   NITRITE NEGATIVE 03/24/2024 1038   LEUKOCYTESUR NEGATIVE 03/24/2024 1038   Sepsis Labs: @LABRCNTIP (procalcitonin:4,lacticidven:4) ) Recent Results (from the past 240 hours)  Resp panel by RT-PCR (RSV, Flu A&B, Covid) Anterior Nasal Swab     Status: None   Collection Time: 03/24/24 12:32 PM   Specimen: Anterior Nasal Swab  Result Value Ref Range Status   SARS Coronavirus 2 by RT PCR NEGATIVE NEGATIVE Final    Comment: (NOTE) SARS-CoV-2 target nucleic acids are NOT DETECTED.  The SARS-CoV-2 RNA is generally detectable in upper respiratory specimens during the acute phase of infection. The lowest concentration of SARS-CoV-2 viral copies this assay can detect is 138 copies/mL. A negative result does not preclude SARS-Cov-2 infection and should not be used as the sole basis for treatment or other patient management decisions. A negative result may occur with  improper specimen collection/handling, submission of specimen other than nasopharyngeal swab, presence of viral mutation(s) within the areas targeted by this assay, and inadequate number of viral copies(<138 copies/mL). A negative result must be combined with clinical observations, patient history, and epidemiological information. The expected result is Negative.  Fact Sheet for Patients:   bloggercourse.com  Fact Sheet for Healthcare Providers:  seriousbroker.it  This test is no t yet approved or cleared by the [place]  FDA and  has been authorized for detection and/or diagnosis of SARS-CoV-2 by FDA under an Emergency Use Authorization (EUA). This EUA will remain  in effect (meaning this test can be used) for the duration of the COVID-19 declaration under Section 564(b)(1) of the Act, 21 U.S.C.section 360bbb-3(b)(1), unless the authorization is terminated  or revoked sooner.       Influenza A by PCR NEGATIVE NEGATIVE Final   Influenza B by PCR NEGATIVE NEGATIVE Final    Comment: (NOTE) The Xpert Xpress SARS-CoV-2/FLU/RSV plus assay is intended as an aid in the diagnosis of influenza from Nasopharyngeal swab specimens and should not be used as a sole basis for treatment. Nasal washings and aspirates are unacceptable for Xpert Xpress SARS-CoV-2/FLU/RSV testing.  Fact Sheet for Patients: bloggercourse.com  Fact Sheet for Healthcare Providers: seriousbroker.it  This test is not yet approved or cleared by the [place]  FDA and has been authorized for detection and/or diagnosis of SARS-CoV-2 by FDA under an Emergency Use Authorization (EUA). This EUA will remain in effect (meaning this test can be used) for the duration of the COVID-19 declaration under Section 564(b)(1) of the Act, 21 U.S.C. section 360bbb-3(b)(1), unless the authorization is terminated or revoked.     Resp Syncytial Virus by PCR NEGATIVE NEGATIVE Final    Comment: (NOTE) Fact Sheet for Patients: bloggercourse.com  Fact Sheet for Healthcare Providers: seriousbroker.it  This test is not yet approved or cleared by the [place]  FDA and has been authorized for detection and/or diagnosis of SARS-CoV-2 by FDA under an Emergency Use  Authorization (EUA). This EUA will  remain in effect (meaning this test can be used) for the duration of the COVID-19 declaration under Section 564(b)(1) of the Act, 21 U.S.C. section 360bbb-3(b)(1), unless the authorization is terminated or revoked.  Performed at [facility], [address], [place], [place] [address]   MRSA Next Gen by PCR, Nasal     Status: None   Collection Time: 03/24/24  3:18 PM   Specimen: Nasal Mucosa; Nasal Swab  Result Value Ref Range Status   MRSA by PCR Next Gen NOT DETECTED NOT DETECTED Final    Comment: (NOTE) The GeneXpert MRSA Assay (FDA approved for NASAL specimens only), is one component of a comprehensive MRSA colonization surveillance program. It is not intended to diagnose MRSA infection nor to guide or monitor treatment for MRSA infections. Test performance is not FDA approved in patients less than 70 years old. Performed at [facility], [address] [place], [place] [address]   Culture, blood (Routine X 2) w Reflex to ID Panel     Status: None (Preliminary result)   Collection Time: 03/24/24  5:23 PM   Specimen: BLOOD  Result Value Ref Range Status   Specimen Description BLOOD BLOOD RIGHT WRIST  Final   Special Requests   Final    BOTTLES DRAWN AEROBIC AND ANAEROBIC Blood Culture adequate volume   Culture   Final    NO GROWTH 3 DAYS Performed at [facility], [address] [place], [place] [address]    Report Status PENDING  Incomplete  Culture, blood (Routine X 2) w Reflex to ID Panel     Status: None (Preliminary result)   Collection Time: 03/24/24  5:23 PM   Specimen: BLOOD RIGHT HAND  Result Value Ref Range Status   Specimen Description BLOOD RIGHT HAND BLOOD  Final   Special Requests   Final    BOTTLES DRAWN AEROBIC AND ANAEROBIC Blood Culture adequate volume   Culture   Final    NO GROWTH 3 DAYS Performed at [facility], [address], [place], [place] [address]    Report Status PENDING  Incomplete     Scheduled Meds:   apixaban   5 mg Oral BID   budesonide  (PULMICORT ) nebulizer solution  0.5 mg Nebulization BID   Chlorhexidine  Gluconate Cloth  6 each Topical Q0600   diltiazem   30 mg Oral Q6H   ipratropium-albuterol   3 mL Nebulization BID   metoprolol  tartrate  37.5 mg Oral BID   Continuous Infusions:  sodium chloride  (hypertonic) 30 mL/hr at 03/27/24 9374    Procedures/Studies: ECHOCARDIOGRAM COMPLETE Result Date: 03/25/2024    ECHOCARDIOGRAM REPORT   Patient Name:   Glenda Nguyen Date of Exam: 03/25/2024 Medical Rec #:  [id number]           Height:       67.0 in Accession #:    [id number]          Weight:       188.3 lb Date of Birth:  Feb 19, 1935           BSA:          1.971 m Patient Age:    89 years            BP:           126/57 mmHg Patient Gender: F                   HR:           102 bpm. Exam Location:  Inpatient Procedure: 2D Echo, Cardiac Doppler and Color Doppler (Both Spectral and Color            Flow Doppler were utilized during procedure). Indications:    Atrial fibrillation  History:        Patient has no prior history of Echocardiogram examinations.                 Arrythmias:Atrial Fibrillation; Risk Factors:Hypertension.  Sonographer:    Philomena Daring Referring Phys: [email or web address] Taeshawn Helfman IMPRESSIONS  1. Left ventricular ejection fraction, by estimation, is 65 to 70%. The left ventricle has normal function. The left ventricle has no regional wall motion abnormalities. There is mild concentric left ventricular hypertrophy. Left ventricular diastolic parameters are indeterminate.  2. Right ventricular systolic function is normal. The right ventricular size is normal.  3. The mitral valve is normal in structure. No evidence of mitral valve regurgitation. No evidence of mitral stenosis.  4. The aortic valve was not well visualized. There is mild calcification of the aortic valve. Aortic valve regurgitation is mild. Aortic valve sclerosis is present, with no evidence of aortic valve stenosis.  5. The  inferior vena cava is normal in size with greater than 50% respiratory variability, suggesting right atrial pressure of 3 mmHg. Comparison(s): No prior Echocardiogram. FINDINGS  Left Ventricle: Left ventricular ejection fraction, by estimation, is 65 to 70%. The left ventricle has normal function. The left ventricle has no regional wall motion abnormalities. The left ventricular internal cavity size was normal in size. There is  mild concentric left ventricular hypertrophy. Left ventricular diastolic parameters are indeterminate. Right Ventricle: The right ventricular size is normal. No increase in right ventricular wall thickness. Right ventricular systolic function is normal. Left Atrium: Left atrial size was normal in size. Right Atrium: Right atrial size was normal in size. Pericardium: There is no evidence of pericardial effusion. Mitral Valve: The mitral valve is normal in structure. No evidence of mitral valve regurgitation. No evidence of mitral valve stenosis. Tricuspid Valve: The tricuspid valve is normal in structure. Tricuspid valve regurgitation is mild . No evidence of tricuspid stenosis. Aortic Valve: The aortic valve was not well visualized. There is mild calcification of the aortic valve. Aortic valve regurgitation is mild. Aortic regurgitation PHT measures 465 msec. Aortic valve sclerosis is present, with no evidence of aortic valve stenosis. Pulmonic Valve: The pulmonic valve was normal in structure. Pulmonic valve regurgitation is mild. No evidence of pulmonic stenosis. Aorta: The aortic root and ascending aorta are structurally normal, with no evidence of dilitation. Venous: The inferior vena cava is normal in size with greater than 50% respiratory variability, suggesting right atrial pressure of 3 mmHg. IAS/Shunts: No atrial level shunt detected by color flow Doppler.  LEFT VENTRICLE PLAX 2D LVIDd:         3.90 cm   Diastology LVIDs:         2.60 cm   LV e' medial:    10.23 cm/s LV PW:          1.10 cm   LV E/e' medial:  9.5 LV IVS:        1.10 cm   LV e' lateral:   14.80 cm/s LVOT diam:     1.80 cm   LV E/e' lateral: 6.5 LV SV:         42 LV SV Index:   22 LVOT Area:     2.54 cm  RIGHT VENTRICLE  IVC RV Basal diam:  2.90 cm     IVC diam: 1.40 cm RV Mid diam:    2.20 cm RV S prime:     14.00 cm/s TAPSE (M-mode): 2.2 cm LEFT ATRIUM             Index        RIGHT ATRIUM           Index LA diam:        3.30 cm 1.67 cm/m   RA Area:     15.70 cm LA Vol (A2C):   46.8 ml 23.74 ml/m  RA Volume:   37.00 ml  18.77 ml/m LA Vol (A4C):   42.7 ml 21.66 ml/m LA Biplane Vol: 48.7 ml 24.71 ml/m  AORTIC VALVE LVOT Vmax:   129.33 cm/s LVOT Vmean:  83.367 cm/s LVOT VTI:    0.167 m AI PHT:      465 msec  AORTA Ao Root diam: 3.20 cm Ao Asc diam:  3.20 cm MITRAL VALVE               TRICUSPID VALVE MV Area (PHT): 4.67 cm    TR Peak grad:   30.9 mmHg MV Decel Time: 163 msec    TR Vmax:        278.00 cm/s MV E velocity: 96.90 cm/s                            SHUNTS                            Systemic VTI:  0.17 m                            Systemic Diam: 1.80 cm Stanly Leavens MD Electronically signed by Stanly Leavens MD Signature Date/Time: 03/25/2024/12:13:57 PM    Final    DG Chest 1 View Result Date: 03/24/2024 CLINICAL DATA:  Weakness and fall. EXAM: CHEST  1 VIEW COMPARISON:  None available. FINDINGS: The heart is mildly enlarged. No pulmonary vascular congestion. Mediastinal silhouette within normal limits. Lungs are clear. Deformity of the lateral RIGHT clavicle suspicious for fracture. IMPRESSION: 1. Mild cardiomegaly. 2. Deformity of the lateral RIGHT clavicle suspicious for fracture. Please correlate for focal tenderness. Electronically Signed   By: Aliene Lloyd M.D.   On: 03/24/2024 14:04   DG Knee Complete 4 Views Right Result Date: 03/24/2024 EXAM: 4 VIEW(S) XRAY OF THE KNEE 03/24/2024 11:07:00 AM COMPARISON: None available. CLINICAL HISTORY: fall FINDINGS: BONES AND JOINTS: No  acute fracture. No malalignment. No significant joint effusion. Mild lateral compartment joint space narrowing. Marginal osteophytosis and subchondral sclerosis of the lateral compartment. Additional subtle marginal osteophytosis of the medial compartment. SOFT TISSUES: Mild diffuse soft tissue swelling. IMPRESSION: 1. No acute fracture or dislocation. 2. Mild diffuse soft tissue swelling. 3. Lateral compartment predominant osteoarthritis. Electronically signed by: Donnice Mania MD 03/24/2024 11:27 AM EST RP Workstation: [id number]   CT Cervical Spine Wo Contrast Result Date: 03/24/2024 EXAM: CT CERVICAL SPINE WITHOUT CONTRAST 03/24/2024 10:59:54 AM TECHNIQUE: CT of the cervical spine was performed without the administration of intravenous contrast. Multiplanar reformatted images are provided for review. Automated exposure control, iterative reconstruction, and/or weight based adjustment of the mA/kV was utilized to reduce the radiation dose to as low as reasonably achievable. COMPARISON: None available. CLINICAL HISTORY: Neck trauma (Age >= 65y) FINDINGS:  BONES AND ALIGNMENT: Straightening of the normal cervical lordosis. Trace degenerative anterolisthesis of C2 on C3 and C3 on C4. Additional trace degenerative anterolisthesis of C7 on T1. No acute fracture or traumatic malalignment. DEGENERATIVE CHANGES: Moderate disc space narrowing at C4-C5. Additional disc space narrowing at C5-C6 and C6-C7. Mild degenerative endplate osteophytes at multiple levels. There are disc osteophyte complexes at multiple levels without evidence of high grade osseous spinal canal stenosis. Facet arthrosis and uncovertebral hypertrophy at multiple levels. SOFT TISSUES: No prevertebral soft tissue swelling. LUNGS: Scarring and atelectasis in the right lung apex. IMPRESSION: 1. No evidence of acute traumatic injury. 2. Degenerative changes as above. Electronically signed by: Donnice Mania MD 03/24/2024 11:25 AM EST RP Workstation:  [id number]   CT Head Wo Contrast Result Date: 03/24/2024 EXAM: CT HEAD WITHOUT CONTRAST 03/24/2024 10:59:54 AM TECHNIQUE: CT of the head was performed without the administration of intravenous contrast. Automated exposure control, iterative reconstruction, and/or weight based adjustment of the mA/kV was utilized to reduce the radiation dose to as low as reasonably achievable. COMPARISON: None available. CLINICAL HISTORY: Head trauma, minor (Age >= 65y). FINDINGS: BRAIN AND VENTRICLES: No acute hemorrhage. 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 Mild chronic microvascular ischemic change. No evidence of acute infarct. No hydrocephalus. No extra-axial collection. No mass effect or midline shift. ORBITS: Bilateral lens replacement noted. SINUSES: Trace right mastoid effusion. SOFT TISSUES AND SKULL: Right periorbital soft tissue swelling. No skull fracture. IMPRESSION: 1. No acute intracranial abnormality. 2. Right periorbital soft tissue swelling. 3. Focal encephalomalacia along the anterolateral left temporal lobe, suggestive of small remote infarct or sequelae of prior trauma. Electronically signed by: Donnice Mania MD 03/24/2024 11:18 AM EST RP Workstation: [id number]    Alm Schneider, DO  [facility]  If 7PM-7AM, please contact night-coverage [email or web address] Password [email or web address] 03/27/2024, 6:55 AM   LOS: 3 days   "

## 2024-03-27 NOTE — Plan of Care (Signed)

## 2024-03-27 NOTE — Progress Notes (Signed)
" ° °  Brief Progress Note   _____________________________________________________________________________________________________________  Patient Name: Glenda Nguyen Patient DOB: 01-19-1935 Date: @TODAY @   Pt going home at this time there is no barriers noted _____________________________________________________________________________________________________________  [facility] RN Expeditor Ronal DELENA Bald Please contact us  directly via secure chat (search for [facility]) or by calling us  at [phone] [facility]).  "

## 2024-03-28 DIAGNOSIS — E871 Hypo-osmolality and hyponatremia: Secondary | ICD-10-CM | POA: Diagnosis not present

## 2024-03-28 DIAGNOSIS — I5031 Acute diastolic (congestive) heart failure: Secondary | ICD-10-CM | POA: Diagnosis not present

## 2024-03-28 DIAGNOSIS — I10 Essential (primary) hypertension: Secondary | ICD-10-CM | POA: Diagnosis not present

## 2024-03-28 DIAGNOSIS — I4891 Unspecified atrial fibrillation: Secondary | ICD-10-CM | POA: Diagnosis not present

## 2024-03-28 DIAGNOSIS — R531 Weakness: Secondary | ICD-10-CM | POA: Diagnosis not present

## 2024-03-28 LAB — BASIC METABOLIC PANEL WITH GFR
Anion gap: 13 (ref 5–15)
BUN: 13 mg/dL (ref 8–23)
CO2: 23 mmol/L (ref 22–32)
Calcium: 8.6 mg/dL — ABNORMAL LOW (ref 8.9–10.3)
Chloride: 89 mmol/L — ABNORMAL LOW (ref 98–111)
Creatinine, Ser: 0.67 mg/dL (ref 0.44–1.00)
GFR, Estimated: >60 mL/min
Glucose, Bld: 114 mg/dL — ABNORMAL HIGH (ref 70–99)
Potassium: 4 mmol/L (ref 3.5–5.1)
Sodium: 125 mmol/L — ABNORMAL LOW (ref 135–145)

## 2024-03-28 LAB — SODIUM: Sodium: 124 mmol/L — ABNORMAL LOW (ref 135–145)

## 2024-03-28 MED ORDER — GUAIFENESIN-DM 100-10 MG/5ML PO SYRP
5.0000 mL | ORAL_SOLUTION | ORAL | Status: DC | PRN
  Administered 2024-03-28: 5 mL via ORAL
  Filled 2024-03-28: qty 5

## 2024-03-28 MED ORDER — FUROSEMIDE 10 MG/ML IJ SOLN
40.0000 mg | Freq: Once | INTRAMUSCULAR | Status: AC
  Administered 2024-03-28: 40 mg via INTRAVENOUS
  Filled 2024-03-28: qty 4

## 2024-03-28 NOTE — Plan of Care (Signed)

## 2024-03-28 NOTE — Progress Notes (Signed)
 " [facility] Progress Note   Subjective:   Seen in room.  Feeling much better.  Edema improving and denies HA confusion nausea. I/Os yest 833 / 2670. 3% saline was stopped and she rec'd po lasix  40mg  yesterday.  Serial sodium stable at 127 last PM but this AM down to 124.  BP labile. On RA.  Objective Vitals:   03/28/24 0600 03/28/24 0652 03/28/24 0817 03/28/24 0850  BP: 135/67 135/67    Pulse: 68 94    Resp: 13 17    Temp:   98.5 F (36.9 C)   TempSrc:   Oral   SpO2: 100% 100%  96%  Weight:      Height:       Physical Exam General: well appearing sitting up reading Bible in bed Heart:RRR Lungs:clear Abdomen: soft Extremities:1+ diffuse edema she says is much better Neuro: nonfocal  Additional Objective Labs: Basic Metabolic Panel: Recent Labs  Lab 03/24/24 1110 03/24/24 1723 03/26/24 1459 03/26/24 1908 03/27/24 0303 03/27/24 0718 03/27/24 1937 03/27/24 2328 03/28/24 0326  NA 110*   < > 123*   < > 126*   < > 127* 124* 124*  K 4.3  --  3.7  --  4.4  --   --   --   --   CL 77*  --  89*  --  93*  --   --   --   --   CO2 19*  --  22  --  29  --   --   --   --   GLUCOSE 107*  --  111*  --  90  --   --   --   --   BUN 15  --  11  --  13  --   --   --   --   CREATININE 0.62  --  0.67  --  0.62  --   --   --   --   CALCIUM 8.5*  --  8.0*  --  7.9*  --   --   --   --    < > = values in this interval not displayed.   Liver Function Tests: Recent Labs  Lab 03/24/24 1110  AST 36  ALT 33  ALKPHOS 99  BILITOT 0.7  PROT 6.3*  ALBUMIN 3.3*   No results for input(s): LIPASE, AMYLASE in the last 168 hours. CBC: Recent Labs  Lab 03/24/24 1110 03/25/24 0552  WBC 17.4* 14.5*  NEUTROABS 16.1*  --   HGB 9.9* 10.0*  HCT 27.0* 27.3*  MCV 85.4 86.1  PLT 406* 413*   Blood Culture    Component Value Date/Time   SDES BLOOD BLOOD RIGHT WRIST 03/24/2024 1723   SDES BLOOD RIGHT HAND BLOOD 03/24/2024 1723   SPECREQUEST  03/24/2024 1723    BOTTLES  DRAWN AEROBIC AND ANAEROBIC Blood Culture adequate volume   SPECREQUEST  03/24/2024 1723    BOTTLES DRAWN AEROBIC AND ANAEROBIC Blood Culture adequate volume   CULT  03/24/2024 1723    NO GROWTH 4 DAYS Performed at [facility], [address] [place], [place] [address]    CULT  03/24/2024 1723    NO GROWTH 4 DAYS Performed at [facility], [address], [place], [place] [address]    REPTSTATUS PENDING 03/24/2024 1723   REPTSTATUS PENDING 03/24/2024 1723    Cardiac Enzymes: No results for input(s): CKTOTAL, CKMB, CKMBINDEX, TROPONINI in the last 168 hours. CBG: No results for input(s):  GLUCAP in the last 168 hours. Iron Studies:  Recent Labs    03/25/24 1118  IRON 16*  TIBC 248*  FERRITIN 301   @[email or web address] @ Studies/Results: US  Venous Img Upper Uni Left (DVT) Result Date: 03/27/2024 CLINICAL DATA:  Left upper extremity edema EXAM: LEFT UPPER EXTREMITY VENOUS DOPPLER ULTRASOUND TECHNIQUE: Gray-scale sonography with graded compression, as well as color Doppler and duplex ultrasound were performed to evaluate the upper extremity deep venous system from the level of the subclavian vein and including the jugular, axillary, basilic, radial, ulnar and upper cephalic vein. Spectral Doppler was utilized to evaluate flow at rest and with distal augmentation maneuvers. COMPARISON:  None Available. FINDINGS: Contralateral Subclavian Vein: Respiratory phasicity is normal and symmetric with the symptomatic side. No evidence of thrombus. Normal compressibility. Internal Jugular Vein: No evidence of thrombus. Normal compressibility, respiratory phasicity and response to augmentation. Subclavian Vein: No evidence of thrombus. Normal compressibility, respiratory phasicity and response to augmentation. Axillary Vein: No evidence of thrombus. Normal compressibility, respiratory phasicity and response to augmentation. Cephalic Vein: Left cephalic vein at the antecubital fossa demonstrates  ill-defined hypoechoic intraluminal thrombus and is noncompressible compatible with superficial thrombosis/thrombophlebitis. Basilic Vein: No evidence of thrombus. Normal compressibility, respiratory phasicity and response to augmentation. Brachial Veins: No evidence of thrombus. Normal compressibility, respiratory phasicity and response to augmentation. Radial Veins: No evidence of thrombus. Normal compressibility, respiratory phasicity and response to augmentation. Ulnar Veins: No evidence of thrombus. Normal compressibility, respiratory phasicity and response to augmentation. IMPRESSION: 1. Negative for left upper extremity DVT. 2. Positive for left cephalic vein superficial thrombosis/thrombophlebitis at the antecubital fossa. Electronically Signed   By: CHRISTELLA.  Shick M.D.   On: 03/27/2024 10:30   Medications:  sodium chloride  (hypertonic) Stopped (03/27/24 1812)    apixaban   5 mg Oral BID   budesonide  (PULMICORT ) nebulizer solution  0.5 mg Nebulization BID   Chlorhexidine  Gluconate Cloth  6 each Topical Q0600   diltiazem   30 mg Oral Q6H   furosemide   40 mg Intravenous Once   ipratropium-albuterol   3 mL Nebulization BID   metoprolol  tartrate  25 mg Oral BID    Assessment/Plan: Severe Acute on Chronic Hyponatremia, improving -starting sodium 12/27 was 110, started on 3% since then with appropriate rise in Na, now 122 -volume status: hypervolemic -studies suggest a component of SIADH and hypervolemia -3% was held yesterday and she had a nice diuresis but serum sodium down slightly this AM -lasix  40 IV redosed this morning -will watch serial sodiums through the day and if still trending down resume low rate 3% later today, if improving will hold on this  Edema: TTE 02/2024 EF  65-70%, mild LVH.  Diastolic fnc indeterminate.  -lasix  plan as above -monitor strict I/O, daily weights   New onset Afib -mgmt per primary service -on eliquis  and metoprolol  - has been rate controlled generally    HTN -BP labile but overall acceptable, monitor for now   Anemia: Hb 10, mild; per primary.  D/w primary, will follow, reach out with concerns.  Manuelita Barters MD 03/28/2024, 9:11 AM  [facility] Pager: [phone]   "

## 2024-03-28 NOTE — Progress Notes (Signed)
 "          PROGRESS NOTE  Glenda Nguyen FMW:[id number] DOB: [birth year] DOA: 03/24/2024 PCP: Vick Lurie, FNP (Inactive)  Brief History:  89 year old female with a history of hypertension and hyperlipidemia presenting with a fall on the morning of 03/24/2024.  The patient states that she has had some nausea and decreased oral intake for at least the past week.  She was feeling a bit dizzy.  She was getting out of bed and sat on the edge of the bed.  She felt like she fell off the edge of the bed onto her right side.  She denied loss of consciousness.  She did hit her head.  She was weak and had difficulty getting off the floor.  She called her daughter-in-law who called the patient's grandson to come help her. The patient complains of pain in her right hand which has been chronic.  She went to see orthopedic surgery in October.  There were plans for operation for carpal tunnel.  She has some right shoulder pain. She denied any fevers, chills, headache, neck pain, chest pain, shortness breath, abdominal pain, Vomiting, diarrhea.  She denies any dysuria or hematuria.  She has not had any hematochezia or melena.  She denies any new medications.  She is a non-smoker.  She does not drink alcohol .  She denies illicit drug use.   In the ED, the patient was afebrile and hemodynamically stable with oxygen saturation 95% room air.  She was tachycardic in the 110s.  WBC 17.4, hemoglobin 9.9, platelets 406.  Sodium 110, potassium 4.3, bicarbonate 19, serum creatinine 0.62.  AST 36, ALT 33, alk phosphatase 99, total bilirubin 0.7.  Albumin is 3.3.  Magnesium  1.7.  EKG showed atrial fibrillation with nonspecific ST changes.  proBNP 2551   Chest x-ray was negative for any infiltrates or edema.  There was a deformity to right clavicle.  CT of the brain was negative for any acute findings.  There was right periorbital swelling of the soft tissues.  CT cervical spine was negative for any traumatic listhesis.   X-ray of the right knee was negative for fracture or dislocation.  The patient was given 2 L of fluid in the ED.  She was admitted for further evaluation and treatment of her hyponatremia.    Assessment/Plan: Hyponatremia -Clinically the patient appears to be hypervolemic/fluid overload. - Na 110>>122>>126>>127>>125 - Patient is alert, awake and oriented x 3 and without any acute neurologic complaint. - Urine osmolarity 600 - Serum osmolarity 236 - Urine sodium <30 - Urine creatinine 104 - Will continue IV diuresis and follow renal function trend - Patient hypertonic saline 3% stopped and on hold since 03/27/2024. - Appreciate assistance and recommendation by nephrology service and will follow further inputs.  Fluid Overload/Acute HFpEF - 12/28 Echo--EF 65-70%, no WMA, normal RVF, mild TR - Continue IV diuresis. -Follow urine output, volume status and electrolytes trend.   New onset atrial fibrillation with RVR - Patient states that she was told by her orthopedist about 2 months ago that she had atrial fibrillation - She has never followed up with her PCP - TSH 2.760 - 12/28 Echo--EF 65-70%, no WMA, normal RVF, mild TR - CHADSVASC2 = 4 (Age x 2, HTN, female) - started apixaban  -03/26/24--started IV diltiazem >>transition to po diltiazem  -Continue telemetry monitoring - Follow electrolytes and replete as needed.   Essential hypertension - Continue the use of metoprolol  and diuretics at the moment - Follow vital signs.  Leukemoid reaction - Blood cultures x 2 sets--neg to date - Obtain UA--0-5 WBC - Personally reviewed chest x-ray--no infiltrates - Check PCT 0.32 - remains afebrile and hemodynamically stable   Pre-Diabetes -12/27 A1C--6.1 -Modified carbohydrate diet has been discussed with patient. -Continue patient follow-up with PCP.   LUE edema -venous duplex -Negative Doppler for DVT; patient with positive cephalic SVT/thrombophlebitis.     Family Communication:    no Family at bedside   Consultants:  none   Code Status:  FULL    DVT Prophylaxis: apixaban      Procedures: As Listed in Progress Note Above   Antibiotics: None  Subjective: No fever, no chest pain, no nausea, no vomiting.  Patient alert/awake and oriented x 3.  Reporting good urine output.  Good saturation on room air.   Objective: Vitals:   03/28/24 0850 03/28/24 0900 03/28/24 1000 03/28/24 1100  BP:  118/76 (!) 157/69 118/66  Pulse:   (!) 101 78  Resp:  20 18 14   Temp:      TempSrc:      SpO2: 96%  100% 99%  Weight:      Height:        Intake/Output Summary (Last 24 hours) at 03/28/2024 1551 Last data filed at 03/28/2024 1155 Gross per 24 hour  Intake 775.65 ml  Output 5920 ml  Net -5144.35 ml   Weight change:  Exam: General exam: Alert, awake, oriented x 3; in no acute distress.  Signs of fluid overload appreciated on exam. Respiratory system: Good saturation on room air. Cardiovascular system: Rate controlled.  No rubs, no gallops, no JVD. Gastrointestinal system: Abdomen is nondistended, soft and nontender. No organomegaly or masses felt. Normal bowel sounds heard. Central nervous system: Generally weak.  No focal neurological deficits. Extremities: No cyanosis or clubbing; 2+ edema appreciated bilaterally. Skin: No petechiae. Psychiatry: Judgement and insight appear normal. Mood & affect appropriate.    Data Reviewed: I have personally reviewed following labs and imaging studies  Basic Metabolic Panel: Recent Labs  Lab 03/24/24 1110 03/24/24 1723 03/25/24 0552 03/25/24 1018 03/26/24 1459 03/26/24 1908 03/27/24 0303 03/27/24 0718 03/27/24 1542 03/27/24 1937 03/27/24 2328 03/28/24 0326 03/28/24 0832  NA 110*   < > 113*   < > 123*   < > 126*   < > 125* 127* 124* 124* 125*  K 4.3  --   --   --  3.7  --  4.4  --   --   --   --   --  4.0  CL 77*  --   --   --  89*  --  93*  --   --   --   --   --  89*  CO2 19*  --   --   --  22  --  29  --    --   --   --   --  23  GLUCOSE 107*  --   --   --  111*  --  90  --   --   --   --   --  114*  BUN 15  --   --   --  11  --  13  --   --   --   --   --  13  CREATININE 0.62  --   --   --  0.67  --  0.62  --   --   --   --   --  0.67  CALCIUM 8.5*  --   --   --  8.0*  --  7.9*  --   --   --   --   --  8.6*  MG 1.7  --  2.0  --  1.9  --  2.4  --   --   --   --   --   --    < > = values in this interval not displayed.   Liver Function Tests: Recent Labs  Lab 03/24/24 1110  AST 36  ALT 33  ALKPHOS 99  BILITOT 0.7  PROT 6.3*  ALBUMIN 3.3*   No results for input(s): LIPASE, AMYLASE in the last 168 hours. No results for input(s): AMMONIA in the last 168 hours. Coagulation Profile: No results for input(s): INR, PROTIME in the last 168 hours. CBC: Recent Labs  Lab 03/24/24 1110 03/25/24 0552  WBC 17.4* 14.5*  NEUTROABS 16.1*  --   HGB 9.9* 10.0*  HCT 27.0* 27.3*  MCV 85.4 86.1  PLT 406* 413*   Urine analysis:    Component Value Date/Time   COLORURINE YELLOW 03/24/2024 1038   APPEARANCEUR CLEAR 03/24/2024 1038   LABSPEC 1.020 03/24/2024 1038   PHURINE 6.0 03/24/2024 1038   GLUCOSEU NEGATIVE 03/24/2024 1038   HGBUR MODERATE (A) 03/24/2024 1038   BILIRUBINUR NEGATIVE 03/24/2024 1038   KETONESUR 5 (A) 03/24/2024 1038   PROTEINUR 30 (A) 03/24/2024 1038   NITRITE NEGATIVE 03/24/2024 1038   LEUKOCYTESUR NEGATIVE 03/24/2024 1038   Sepsis Labs: @LABRCNTIP (procalcitonin:4,lacticidven:4) ) Recent Results (from the past 240 hours)  Resp panel by RT-PCR (RSV, Flu A&B, Covid) Anterior Nasal Swab     Status: None   Collection Time: 03/24/24 12:32 PM   Specimen: Anterior Nasal Swab  Result Value Ref Range Status   SARS Coronavirus 2 by RT PCR NEGATIVE NEGATIVE Final    Comment: (NOTE) SARS-CoV-2 target nucleic acids are NOT DETECTED.  The SARS-CoV-2 RNA is generally detectable in upper respiratory specimens during the acute phase of infection. The lowest concentration  of SARS-CoV-2 viral copies this assay can detect is 138 copies/mL. A negative result does not preclude SARS-Cov-2 infection and should not be used as the sole basis for treatment or other patient management decisions. A negative result may occur with  improper specimen collection/handling, submission of specimen other than nasopharyngeal swab, presence of viral mutation(s) within the areas targeted by this assay, and inadequate number of viral copies(<138 copies/mL). A negative result must be combined with clinical observations, patient history, and epidemiological information. The expected result is Negative.  Fact Sheet for Patients:  bloggercourse.com  Fact Sheet for Healthcare Providers:  seriousbroker.it  This test is no t yet approved or cleared by the [place]  FDA and  has been authorized for detection and/or diagnosis of SARS-CoV-2 by FDA under an Emergency Use Authorization (EUA). This EUA will remain  in effect (meaning this test can be used) for the duration of the COVID-19 declaration under Section 564(b)(1) of the Act, 21 U.S.C.section 360bbb-3(b)(1), unless the authorization is terminated  or revoked sooner.       Influenza A by PCR NEGATIVE NEGATIVE Final   Influenza B by PCR NEGATIVE NEGATIVE Final    Comment: (NOTE) The Xpert Xpress SARS-CoV-2/FLU/RSV plus assay is intended as an aid in the diagnosis of influenza from Nasopharyngeal swab specimens and should not be used as a sole basis for treatment. Nasal washings and aspirates are unacceptable for Xpert Xpress SARS-CoV-2/FLU/RSV testing.  Fact Sheet for Patients: bloggercourse.com  Fact Sheet for Healthcare Providers: seriousbroker.it  This test is not yet approved or cleared by the [place]  FDA and has been authorized for detection and/or diagnosis of SARS-CoV-2 by FDA under an Emergency Use  Authorization (EUA). This EUA will remain in effect (meaning this test can be used) for the duration of the COVID-19 declaration under Section 564(b)(1) of the Act, 21 U.S.C. section 360bbb-3(b)(1), unless the authorization is terminated or revoked.     Resp Syncytial Virus by PCR NEGATIVE NEGATIVE Final    Comment: (NOTE) Fact Sheet for Patients: bloggercourse.com  Fact Sheet for Healthcare Providers: seriousbroker.it  This test is not yet approved or cleared by the [place]  FDA and has been authorized for detection and/or diagnosis of SARS-CoV-2 by FDA under an Emergency Use Authorization (EUA). This EUA will remain in effect (meaning this test can be used) for the duration of the COVID-19 declaration under Section 564(b)(1) of the Act, 21 U.S.C. section 360bbb-3(b)(1), unless the authorization is terminated or revoked.  Performed at [facility], [address], [place], [place] [address]   MRSA Next Gen by PCR, Nasal     Status: None   Collection Time: 03/24/24  3:18 PM   Specimen: Nasal Mucosa; Nasal Swab  Result Value Ref Range Status   MRSA by PCR Next Gen NOT DETECTED NOT DETECTED Final    Comment: (NOTE) The GeneXpert MRSA Assay (FDA approved for NASAL specimens only), is one component of a comprehensive MRSA colonization surveillance program. It is not intended to diagnose MRSA infection nor to guide or monitor treatment for MRSA infections. Test performance is not FDA approved in patients less than 52 years old. Performed at [facility], [address] [place], [place] [address]   Culture, blood (Routine X 2) w Reflex to ID Panel     Status: None (Preliminary result)   Collection Time: 03/24/24  5:23 PM   Specimen: BLOOD  Result Value Ref Range Status   Specimen Description BLOOD BLOOD RIGHT WRIST  Final   Special Requests   Final    BOTTLES DRAWN AEROBIC AND ANAEROBIC Blood Culture adequate volume   Culture    Final    NO GROWTH 4 DAYS Performed at [facility], [address] [place], [place] [address]    Report Status PENDING  Incomplete  Culture, blood (Routine X 2) w Reflex to ID Panel     Status: None (Preliminary result)   Collection Time: 03/24/24  5:23 PM   Specimen: BLOOD RIGHT HAND  Result Value Ref Range Status   Specimen Description BLOOD RIGHT HAND BLOOD  Final   Special Requests   Final    BOTTLES DRAWN AEROBIC AND ANAEROBIC Blood Culture adequate volume   Culture   Final    NO GROWTH 4 DAYS Performed at [facility], [address] [place], [place] [address]    Report Status PENDING  Incomplete     Scheduled Meds:  apixaban   5 mg Oral BID   budesonide  (PULMICORT ) nebulizer solution  0.5 mg Nebulization BID   Chlorhexidine  Gluconate Cloth  6 each Topical Q0600   diltiazem   30 mg Oral Q6H   ipratropium-albuterol   3 mL Nebulization BID   metoprolol  tartrate  25 mg Oral BID   Continuous Infusions:  sodium chloride  (hypertonic) Stopped (03/27/24 1812)    Procedures/Studies: US  Venous Img Upper Uni Left (DVT) Result Date: 03/27/2024 CLINICAL DATA:  Left upper extremity edema EXAM: LEFT UPPER EXTREMITY VENOUS DOPPLER ULTRASOUND TECHNIQUE: Gray-scale sonography with graded  compression, as well as color Doppler and duplex ultrasound were performed to evaluate the upper extremity deep venous system from the level of the subclavian vein and including the jugular, axillary, basilic, radial, ulnar and upper cephalic vein. Spectral Doppler was utilized to evaluate flow at rest and with distal augmentation maneuvers. COMPARISON:  None Available. FINDINGS: Contralateral Subclavian Vein: Respiratory phasicity is normal and symmetric with the symptomatic side. No evidence of thrombus. Normal compressibility. Internal Jugular Vein: No evidence of thrombus. Normal compressibility, respiratory phasicity and response to augmentation. Subclavian Vein: No evidence of thrombus. Normal  compressibility, respiratory phasicity and response to augmentation. Axillary Vein: No evidence of thrombus. Normal compressibility, respiratory phasicity and response to augmentation. Cephalic Vein: Left cephalic vein at the antecubital fossa demonstrates ill-defined hypoechoic intraluminal thrombus and is noncompressible compatible with superficial thrombosis/thrombophlebitis. Basilic Vein: No evidence of thrombus. Normal compressibility, respiratory phasicity and response to augmentation. Brachial Veins: No evidence of thrombus. Normal compressibility, respiratory phasicity and response to augmentation. Radial Veins: No evidence of thrombus. Normal compressibility, respiratory phasicity and response to augmentation. Ulnar Veins: No evidence of thrombus. Normal compressibility, respiratory phasicity and response to augmentation. IMPRESSION: 1. Negative for left upper extremity DVT. 2. Positive for left cephalic vein superficial thrombosis/thrombophlebitis at the antecubital fossa. Electronically Signed   By: CHRISTELLA.  Shick M.D.   On: 03/27/2024 10:30   ECHOCARDIOGRAM COMPLETE Result Date: 03/25/2024    ECHOCARDIOGRAM REPORT   Patient Name:   Glenda Nguyen Date of Exam: 03/25/2024 Medical Rec #:  [id number]           Height:       67.0 in Accession #:    [id number]          Weight:       188.3 lb Date of Birth:  November 24, 1934           BSA:          1.971 m Patient Age:    89 years            BP:           126/57 mmHg Patient Gender: F                   HR:           102 bpm. Exam Location:  Inpatient Procedure: 2D Echo, Cardiac Doppler and Color Doppler (Both Spectral and Color            Flow Doppler were utilized during procedure). Indications:    Atrial fibrillation  History:        Patient has no prior history of Echocardiogram examinations.                 Arrythmias:Atrial Fibrillation; Risk Factors:Hypertension.  Sonographer:    Philomena Daring Referring Phys: CLARISSA.CLIMES DAVID TAT IMPRESSIONS  1. Left ventricular  ejection fraction, by estimation, is 65 to 70%. The left ventricle has normal function. The left ventricle has no regional wall motion abnormalities. There is mild concentric left ventricular hypertrophy. Left ventricular diastolic parameters are indeterminate.  2. Right ventricular systolic function is normal. The right ventricular size is normal.  3. The mitral valve is normal in structure. No evidence of mitral valve regurgitation. No evidence of mitral stenosis.  4. The aortic valve was not well visualized. There is mild calcification of the aortic valve. Aortic valve regurgitation is mild. Aortic valve sclerosis is present, with no evidence of aortic valve stenosis.  5. The inferior vena cava  is normal in size with greater than 50% respiratory variability, suggesting right atrial pressure of 3 mmHg. Comparison(s): No prior Echocardiogram. FINDINGS  Left Ventricle: Left ventricular ejection fraction, by estimation, is 65 to 70%. The left ventricle has normal function. The left ventricle has no regional wall motion abnormalities. The left ventricular internal cavity size was normal in size. There is  mild concentric left ventricular hypertrophy. Left ventricular diastolic parameters are indeterminate. Right Ventricle: The right ventricular size is normal. No increase in right ventricular wall thickness. Right ventricular systolic function is normal. Left Atrium: Left atrial size was normal in size. Right Atrium: Right atrial size was normal in size. Pericardium: There is no evidence of pericardial effusion. Mitral Valve: The mitral valve is normal in structure. No evidence of mitral valve regurgitation. No evidence of mitral valve stenosis. Tricuspid Valve: The tricuspid valve is normal in structure. Tricuspid valve regurgitation is mild . No evidence of tricuspid stenosis. Aortic Valve: The aortic valve was not well visualized. There is mild calcification of the aortic valve. Aortic valve regurgitation is mild.  Aortic regurgitation PHT measures 465 msec. Aortic valve sclerosis is present, with no evidence of aortic valve stenosis. Pulmonic Valve: The pulmonic valve was normal in structure. Pulmonic valve regurgitation is mild. No evidence of pulmonic stenosis. Aorta: The aortic root and ascending aorta are structurally normal, with no evidence of dilitation. Venous: The inferior vena cava is normal in size with greater than 50% respiratory variability, suggesting right atrial pressure of 3 mmHg. IAS/Shunts: No atrial level shunt detected by color flow Doppler.  LEFT VENTRICLE PLAX 2D LVIDd:         3.90 cm   Diastology LVIDs:         2.60 cm   LV e' medial:    10.23 cm/s LV PW:         1.10 cm   LV E/e' medial:  9.5 LV IVS:        1.10 cm   LV e' lateral:   14.80 cm/s LVOT diam:     1.80 cm   LV E/e' lateral: 6.5 LV SV:         42 LV SV Index:   22 LVOT Area:     2.54 cm  RIGHT VENTRICLE             IVC RV Basal diam:  2.90 cm     IVC diam: 1.40 cm RV Mid diam:    2.20 cm RV S prime:     14.00 cm/s TAPSE (M-mode): 2.2 cm LEFT ATRIUM             Index        RIGHT ATRIUM           Index LA diam:        3.30 cm 1.67 cm/m   RA Area:     15.70 cm LA Vol (A2C):   46.8 ml 23.74 ml/m  RA Volume:   37.00 ml  18.77 ml/m LA Vol (A4C):   42.7 ml 21.66 ml/m LA Biplane Vol: 48.7 ml 24.71 ml/m  AORTIC VALVE LVOT Vmax:   129.33 cm/s LVOT Vmean:  83.367 cm/s LVOT VTI:    0.167 m AI PHT:      465 msec  AORTA Ao Root diam: 3.20 cm Ao Asc diam:  3.20 cm MITRAL VALVE               TRICUSPID VALVE MV Area (PHT): 4.67 cm  TR Peak grad:   30.9 mmHg MV Decel Time: 163 msec    TR Vmax:        278.00 cm/s MV E velocity: 96.90 cm/s                            SHUNTS                            Systemic VTI:  0.17 m                            Systemic Diam: 1.80 cm Stanly Leavens MD Electronically signed by Stanly Leavens MD Signature Date/Time: 03/25/2024/12:13:57 PM    Final    DG Chest 1 View Result Date:  03/24/2024 CLINICAL DATA:  Weakness and fall. EXAM: CHEST  1 VIEW COMPARISON:  None available. FINDINGS: The heart is mildly enlarged. No pulmonary vascular congestion. Mediastinal silhouette within normal limits. Lungs are clear. Deformity of the lateral RIGHT clavicle suspicious for fracture. IMPRESSION: 1. Mild cardiomegaly. 2. Deformity of the lateral RIGHT clavicle suspicious for fracture. Please correlate for focal tenderness. Electronically Signed   By: Aliene Lloyd M.D.   On: 03/24/2024 14:04   DG Knee Complete 4 Views Right Result Date: 03/24/2024 EXAM: 4 VIEW(S) XRAY OF THE KNEE 03/24/2024 11:07:00 AM COMPARISON: None available. CLINICAL HISTORY: fall FINDINGS: BONES AND JOINTS: No acute fracture. No malalignment. No significant joint effusion. Mild lateral compartment joint space narrowing. Marginal osteophytosis and subchondral sclerosis of the lateral compartment. Additional subtle marginal osteophytosis of the medial compartment. SOFT TISSUES: Mild diffuse soft tissue swelling. IMPRESSION: 1. No acute fracture or dislocation. 2. Mild diffuse soft tissue swelling. 3. Lateral compartment predominant osteoarthritis. Electronically signed by: Donnice Mania MD 03/24/2024 11:27 AM EST RP Workstation: [id number]   CT Cervical Spine Wo Contrast Result Date: 03/24/2024 EXAM: CT CERVICAL SPINE WITHOUT CONTRAST 03/24/2024 10:59:54 AM TECHNIQUE: CT of the cervical spine was performed without the administration of intravenous contrast. Multiplanar reformatted images are provided for review. Automated exposure control, iterative reconstruction, and/or weight based adjustment of the mA/kV was utilized to reduce the radiation dose to as low as reasonably achievable. COMPARISON: None available. CLINICAL HISTORY: Neck trauma (Age >= 65y) FINDINGS: BONES AND ALIGNMENT: Straightening of the normal cervical lordosis. Trace degenerative anterolisthesis of C2 on C3 and C3 on C4. Additional trace degenerative  anterolisthesis of C7 on T1. No acute fracture or traumatic malalignment. DEGENERATIVE CHANGES: Moderate disc space narrowing at C4-C5. Additional disc space narrowing at C5-C6 and C6-C7. Mild degenerative endplate osteophytes at multiple levels. There are disc osteophyte complexes at multiple levels without evidence of high grade osseous spinal canal stenosis. Facet arthrosis and uncovertebral hypertrophy at multiple levels. SOFT TISSUES: No prevertebral soft tissue swelling. LUNGS: Scarring and atelectasis in the right lung apex. IMPRESSION: 1. No evidence of acute traumatic injury. 2. Degenerative changes as above. Electronically signed by: Donnice Mania MD 03/24/2024 11:25 AM EST RP Workstation: [id number]   CT Head Wo Contrast Result Date: 03/24/2024 EXAM: CT HEAD WITHOUT CONTRAST 03/24/2024 10:59:54 AM TECHNIQUE: CT of the head was performed without the administration of intravenous contrast. Automated exposure control, iterative reconstruction, and/or weight based adjustment of the mA/kV was utilized to reduce the radiation dose to as low as reasonably achievable. COMPARISON: None available. CLINICAL HISTORY: Head trauma, minor (Age >= 65y). FINDINGS: BRAIN AND VENTRICLES: No acute hemorrhage.  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 Mild chronic microvascular ischemic change. No evidence of acute infarct. No hydrocephalus. No extra-axial collection. No mass effect or midline shift. ORBITS: Bilateral lens replacement noted. SINUSES: Trace right mastoid effusion. SOFT TISSUES AND SKULL: Right periorbital soft tissue swelling. No skull fracture. IMPRESSION: 1. No acute intracranial abnormality. 2. Right periorbital soft tissue swelling. 3. Focal encephalomalacia along the anterolateral left temporal lobe, suggestive of small remote infarct or sequelae of prior trauma. Electronically signed by: Donnice Mania MD 03/24/2024  11:18 AM EST RP Workstation: [id number]    Eric Nunnery, MD  [facility]  If 7PM-7AM, please contact night-coverage [email or web address] Password [email or web address] 03/28/2024, 3:51 PM   LOS: 4 days   "

## 2024-03-29 DIAGNOSIS — I4891 Unspecified atrial fibrillation: Secondary | ICD-10-CM | POA: Diagnosis not present

## 2024-03-29 DIAGNOSIS — E871 Hypo-osmolality and hyponatremia: Secondary | ICD-10-CM | POA: Diagnosis not present

## 2024-03-29 DIAGNOSIS — I5031 Acute diastolic (congestive) heart failure: Secondary | ICD-10-CM | POA: Diagnosis not present

## 2024-03-29 DIAGNOSIS — I10 Essential (primary) hypertension: Secondary | ICD-10-CM | POA: Diagnosis not present

## 2024-03-29 LAB — MAGNESIUM: Magnesium: 2 mg/dL (ref 1.7–2.4)

## 2024-03-29 LAB — CULTURE, BLOOD (ROUTINE X 2)
Culture: NO GROWTH
Culture: NO GROWTH
Special Requests: ADEQUATE
Special Requests: ADEQUATE

## 2024-03-29 LAB — BASIC METABOLIC PANEL WITH GFR
Anion gap: 5 (ref 5–15)
BUN: 14 mg/dL (ref 8–23)
CO2: 31 mmol/L (ref 22–32)
Calcium: 8 mg/dL — ABNORMAL LOW (ref 8.9–10.3)
Chloride: 87 mmol/L — ABNORMAL LOW (ref 98–111)
Creatinine, Ser: 0.63 mg/dL (ref 0.44–1.00)
GFR, Estimated: >60 mL/min
Glucose, Bld: 86 mg/dL (ref 70–99)
Potassium: 4.2 mmol/L (ref 3.5–5.1)
Sodium: 123 mmol/L — ABNORMAL LOW (ref 135–145)

## 2024-03-29 LAB — PROTEIN / CREATININE RATIO, URINE
Creatinine, Urine: 16 mg/dL
Total Protein, Urine: <6 mg/dL

## 2024-03-29 LAB — SODIUM: Sodium: 130 mmol/L — ABNORMAL LOW (ref 135–145)

## 2024-03-29 MED ORDER — SENNOSIDES-DOCUSATE SODIUM 8.6-50 MG PO TABS
2.0000 | ORAL_TABLET | Freq: Once | ORAL | Status: AC
  Administered 2024-03-29: 2 via ORAL
  Filled 2024-03-29: qty 2

## 2024-03-29 MED ORDER — TOLVAPTAN 15 MG PO TABS
15.0000 mg | ORAL_TABLET | Freq: Once | ORAL | Status: AC
  Administered 2024-03-29: 15 mg via ORAL
  Filled 2024-03-29: qty 1

## 2024-03-29 MED ORDER — IPRATROPIUM-ALBUTEROL 0.5-2.5 (3) MG/3ML IN SOLN: 3.0000 mL | Freq: Four times a day (QID) | RESPIRATORY_TRACT | Status: DC | PRN

## 2024-03-29 NOTE — Plan of Care (Signed)
   Problem: Education: Goal: Knowledge of General Education information will improve Description: Including pain rating scale, medication(s)/side effects and non-pharmacologic comfort measures Outcome: Progressing   Problem: Activity: Goal: Risk for activity intolerance will decrease Outcome: Progressing   Problem: Nutrition: Goal: Adequate nutrition will be maintained Outcome: Progressing

## 2024-03-29 NOTE — TOC Progression Note (Signed)
 Transition of Care [facility]) - Progression Note    Patient Details  Name: Glenda Nguyen MRN: [id number] Date of Birth: 1934/10/26  Transition of [facility]) CM/SW Contact  Hoy DELENA Bigness, LCSW Phone Number: 03/29/2024, 11:25 AM  Clinical Narrative:    Pt agreeable to recommendation for [facility] and DME. Pt denies having HH in the past and does not have an agency preference. HHPT/OT arranged with [facility]. HH order will need to be placed prior to discharge. BSC ordered through [employer] to be delivered to pt's room prior to discharge. CSW called pt's DIL,  [place], and provided update.    Expected Discharge Plan: Home w Home Health Services Barriers to Discharge: Continued Medical Work up               Expected Discharge Plan and Services In-house Referral: Clinical Social Work   Post Acute Care Choice: Home Health, Durable Medical Equipment Living arrangements for the past 2 months: Single Family Home                 DME Arranged: Bedside commode DME Agency: [facility] Date DME Agency Contacted: 03/29/24 Time DME Agency Contacted: 1124 Representative spoke with at DME Agency: Mitch HH Arranged: PT, OT HH Agency: [facility] Date [facility] Agency Contacted: 03/29/24 Time HH Agency Contacted: [phone] Representative spoke with at [facility] Agency: [facility]   Social Drivers of Health (SDOH) Interventions SDOH Screenings   Food Insecurity: No Food Insecurity (03/24/2024)  Housing: Low Risk (03/24/2024)  Transportation Needs: No Transportation Needs (03/24/2024)  Utilities: Not At Risk (03/24/2024)  Social Connections: Unknown (03/24/2024)  Tobacco Use: Low Risk (03/24/2024)    Readmission Risk Interventions    03/29/2024   11:22 AM  Readmission Risk Prevention Plan  Post Dischage Appt Complete  Medication Screening Complete  Transportation Screening Complete

## 2024-03-29 NOTE — Progress Notes (Signed)
 " [facility] Progress Note   Subjective:   Seen in room moved from ICU to floor.  Feeling much better.  Edema improving and denies HA confusion nausea. I/Os yest 240 / 4750 with lasix  40 IV. Serial sodium stable at 125 last AM but this AM down to 123.  BP labile but OK. On RA.    Objective Vitals:   03/28/24 2123 03/29/24 0224 03/29/24 0513 03/29/24 0741  BP: (!) 144/73 113/64 130/71   Pulse: 97 67 80   Resp:  18    Temp:  98.4 F (36.9 C) 98.5 F (36.9 C)   TempSrc:  Oral Oral   SpO2:  99% 99% 96%  Weight:   81.5 kg   Height:   5' 7 (1.702 m)    Physical Exam General: well appearing sitting up in chair Heart:RRR Lungs:clear Abdomen: soft Extremities:1+ diffuse edema improving  Neuro: nonfocal  Additional Objective Labs: Basic Metabolic Panel: Recent Labs  Lab 03/27/24 0303 03/27/24 0718 03/28/24 0326 03/28/24 0832 03/29/24 0404  NA 126*   < > 124* 125* 123*  K 4.4  --   --  4.0 4.2  CL 93*  --   --  89* 87*  CO2 29  --   --  23 31  GLUCOSE 90  --   --  114* 86  BUN 13  --   --  13 14  CREATININE 0.62  --   --  0.67 0.63  CALCIUM 7.9*  --   --  8.6* 8.0*   < > = values in this interval not displayed.   Liver Function Tests: Recent Labs  Lab 03/24/24 1110  AST 36  ALT 33  ALKPHOS 99  BILITOT 0.7  PROT 6.3*  ALBUMIN 3.3*   No results for input(s): LIPASE, AMYLASE in the last 168 hours. CBC: Recent Labs  Lab 03/24/24 1110 03/25/24 0552  WBC 17.4* 14.5*  NEUTROABS 16.1*  --   HGB 9.9* 10.0*  HCT 27.0* 27.3*  MCV 85.4 86.1  PLT 406* 413*   Blood Culture    Component Value Date/Time   SDES BLOOD BLOOD RIGHT WRIST 03/24/2024 1723   SDES BLOOD RIGHT HAND BLOOD 03/24/2024 1723   SPECREQUEST  03/24/2024 1723    BOTTLES DRAWN AEROBIC AND ANAEROBIC Blood Culture adequate volume   SPECREQUEST  03/24/2024 1723    BOTTLES DRAWN AEROBIC AND ANAEROBIC Blood Culture adequate volume   CULT  03/24/2024 1723    NO GROWTH 5  DAYS Performed at [facility], [address] [place], [place] [address]    CULT  03/24/2024 1723    NO GROWTH 5 DAYS Performed at [facility], [address] [place], [place] [address]    REPTSTATUS 03/29/2024 FINAL 03/24/2024 1723   REPTSTATUS 03/29/2024 FINAL 03/24/2024 1723    Cardiac Enzymes: No results for input(s): CKTOTAL, CKMB, CKMBINDEX, TROPONINI in the last 168 hours. CBG: No results for input(s): GLUCAP in the last 168 hours. Iron Studies:  No results for input(s): IRON, TIBC, TRANSFERRIN, FERRITIN in the last 72 hours.  @lablastinr3 @ Studies/Results: No results found.  Medications:    apixaban   5 mg Oral BID   budesonide  (PULMICORT ) nebulizer solution  0.5 mg Nebulization BID   Chlorhexidine  Gluconate Cloth  6 each Topical Q0600   diltiazem   30 mg Oral Q6H   metoprolol  tartrate  25 mg Oral BID   tolvaptan  15 mg Oral Once    Assessment/Plan: Severe Acute on Chronic Hyponatremia, improving -starting  sodium 12/27 was 110, started on 3% since then with appropriate rise in Na, off since 12/30 -volume status: hypervolemic -studies suggest a component of SIADH and hypervolemia -she had a substantial diuresis yesterday with lasix  40 IV but serum sodium down a bit today.  -still has volume on and with persistent hyponatremia will dose with tolvaptan 15mg  today -q8h sodiums ordered today, has strict I/Os, daily weights ordered  Edema: TTE 02/2024 EF  65-70%, mild LVH.  Diastolic fnc indeterminate.   UA 1+ protein, doubt nephrotic syndrome, will check UP/C. -diuresing well -plan as above for tolvaptan today -monitor strict I/O, daily weights   New onset Afib -mgmt per primary service -on eliquis  and metoprolol  - has been rate controlled generally   HTN -BP labile but overall acceptable, monitor for now   Anemia: Hb 10, mild; per primary.  D/w primary, will follow, reach out with concerns.  Manuelita Barters MD 03/29/2024, 10:56 AM   [facility] Pager: [phone]   "

## 2024-03-29 NOTE — Progress Notes (Signed)
 The beneficiary is discharging home and will be confined to a single room, in order to safely discharge back home beneficiary will need a  bedside commode.

## 2024-03-29 NOTE — Plan of Care (Signed)
" °  Problem: Acute Rehab PT Goals(only PT should resolve) Goal: Pt Will Go Supine/Side To Sit Outcome: Progressing Flowsheets (Taken 03/29/2024 1200) Pt will go Supine/Side to Sit: with modified independence Goal: Patient Will Transfer Sit To/From Stand Outcome: Progressing Flowsheets (Taken 03/29/2024 1200) Patient will transfer sit to/from stand: with supervision Goal: Pt Will Transfer Bed To Chair/Chair To Bed Outcome: Progressing Flowsheets (Taken 03/29/2024 1200) Pt will Transfer Bed to Chair/Chair to Bed: with supervision Goal: Pt Will Ambulate Outcome: Progressing Flowsheets (Taken 03/29/2024 1200) Pt will Ambulate:  75 feet  with least restrictive assistive device  with supervision    12:01 PM, 03/29/2024 Rosaria Settler, PT, DPT [place] with [facility]  "

## 2024-03-29 NOTE — Evaluation (Signed)
 Occupational Therapy Evaluation Patient Details Name: Glenda Nguyen MRN: [id number] DOB: Jan 15, 1935 Today's Date: 03/29/2024   History of Present Illness   Glenda Nguyen is a 89 year old female with a history of hypertension and hyperlipidemia presenting with a fall on the morning of 03/24/2024.  The patient states that she has had some nausea and decreased oral intake for at least the past week.  She was feeling a bit dizzy.  She was getting out of bed and sat on the edge of the bed.  She felt like she fell off the edge of the bed onto her right side.  She denied loss of consciousness.  She did hit her head.  She was weak and had difficulty getting off the floor.  She called her daughter-in-law who called the patient's grandson to come help her.  The patient complains of pain in her right hand which has been chronic.  She went to see orthopedic surgery in October.  There were plans for operation for carpal tunnel.  She has some right shoulder pain.  She denied any fevers, chills, headache, neck pain, chest pain, shortness breath, abdominal pain, Vomiting, diarrhea.  She denies any dysuria or hematuria.  She has not had any hematochezia or melena.  She denies any new medications.  She is a non-smoker.  She does not drink alcohol .  She denies illicit drug use. (Per MD)     Clinical Impressions Pt agreeable to OT and PT co-evaluation. Pt is independent at baseline but required use of SPC today due to instability without AD. Min A often for sit to stand or in need of grab bar to stand from toilet. B UE weak with mild limited A/ROM at the shoulder. Pt at level of set up for most ADL's but CGA to min A for tasks that include standing. Pt left in the chair with call bell within reach and chair alarm set. Pt will benefit from continued OT in the hospital to increase strength, balance, and endurance for safe ADL's.        If plan is discharge home, recommend the following:   A little help with  walking and/or transfers;A little help with bathing/dressing/bathroom;Assistance with cooking/housework;Assist for transportation;Help with stairs or ramp for entrance     Functional Status Assessment   Patient has had a recent decline in their functional status and demonstrates the ability to make significant improvements in function in a reasonable and predictable amount of time.     Equipment Recommendations   BSC/3in1             Precautions/Restrictions   Precautions Precautions: Fall Recall of Precautions/Restrictions: Intact Restrictions Weight Bearing Restrictions Per Provider Order: No     Mobility Bed Mobility Overal bed mobility: Needs Assistance Bed Mobility: Supine to Sit     Supine to sit: Supervision, Contact guard     General bed mobility comments: Labored movement.    Transfers Overall transfer level: Needs assistance Equipment used: Straight cane Transfers: Sit to/from Stand, Bed to chair/wheelchair/BSC Sit to Stand: Min assist     Step pivot transfers: Min assist, Contact guard assist     General transfer comment: EOB to chair with SPC; assist for sit to stand; labored movement.      Balance Overall balance assessment: Needs assistance Sitting-balance support: No upper extremity supported, Feet supported Sitting balance-Leahy Scale: Good Sitting balance - Comments: seated at EOB   Standing balance support: Single extremity supported, During functional activity Standing balance-Leahy Scale: Fair Standing  balance comment: fair with SPC                           ADL either performed or assessed with clinical judgement   ADL Overall ADL's : Needs assistance/impaired     Grooming: Set up;Sitting   Upper Body Bathing: Set up;Sitting   Lower Body Bathing: Set up;Sitting/lateral leans   Upper Body Dressing : Set up;Sitting   Lower Body Dressing: Set up;Sitting/lateral leans   Toilet Transfer: Contact guard  assist;Minimal assistance;Ambulation [facility]) Toilet Transfer Details (indicate cue type and reason): Ambualtory transfer to toilet with SPC; requried use of grab bar to R side with need of assist without bar. Pt does not have a bar at home and has a low toilet. Toileting- Clothing Manipulation and Hygiene: Contact guard assist;Minimal assistance;Sitting/lateral lean;Sit to/from stand       Functional mobility during ADLs: Contact guard assist;Cane       Vision Baseline Vision/History: 1 Wears glasses Ability to See in Adequate Light: 1 Impaired Patient Visual Report: No change from baseline Vision Assessment?: No apparent visual deficits;Wears glasses for reading     Perception Perception: Not tested       Praxis Praxis: Not tested       Pertinent Vitals/Pain Pain Assessment Pain Assessment: Faces Faces Pain Scale: Hurts a little bit Pain Location: R middle finger; Pain Descriptors / Indicators: Stabbing Pain Intervention(s): Monitored during session, Repositioned     Extremity/Trunk Assessment Upper Extremity Assessment Upper Extremity Assessment: Generalized weakness;RUE deficits/detail (3-/5 MMT bilaterally for shoulder flexion but full P/ROM of flexion.) RUE Deficits / Details: Pt reports she is having surgery on R middle finger PIP in 2 weeks. Inability to make a full grasp or fist with R hand. RUE Coordination: decreased fine motor   Lower Extremity Assessment Lower Extremity Assessment: Defer to PT evaluation   Cervical / Trunk Assessment Cervical / Trunk Assessment: Kyphotic   Communication Communication Communication: No apparent difficulties   Cognition Arousal: Alert Behavior During Therapy: WFL for tasks assessed/performed Cognition: No apparent impairments                               Following commands: Intact       Cueing  General Comments   Cueing Techniques: Verbal cues;Tactile cues                 Home Living  Family/patient expects to be discharged to:: Private residence Living Arrangements: Children;Other relatives Available Help at Discharge: Family;Available 24 hours/day;Friend(s) Type of Home: House Home Access: Stairs to enter [employer] of Steps: 3 Entrance Stairs-Rails: Right;Left;Can reach both Home Layout: One level     Bathroom Shower/Tub: Chief Strategy Officer: Standard (low) Bathroom Accessibility: Yes How Accessible: Accessible via wheelchair;Accessible via walker Home Equipment: Other (comment) (walking stick)   Additional Comments: Lives with daughter-in-law  and grandson      Prior Functioning/Environment Prior Level of Function : Independent/Modified Independent;Driving;History of Falls (last six months)             Mobility Comments: Community ambulator without AD; drives; reported using walking stick at times to help reach things and boost from a low surface. ADLs Comments: Independent.    OT Problem List: Decreased strength;Decreased range of motion;Decreased activity tolerance;Impaired balance (sitting and/or standing);Pain   OT Treatment/Interventions: Self-care/ADL training;Therapeutic exercise;DME and/or AE instruction;Therapeutic activities;Patient/family education;Balance training  OT Goals(Current goals can be found in the care plan section)   Acute Rehab OT Goals Patient Stated Goal: Return home OT Goal Formulation: With patient Time For Goal Achievement: 04/12/24 Potential to Achieve Goals: Good   OT Frequency:  Min 2X/week    Co-evaluation PT/OT/SLP Co-Evaluation/Treatment: Yes Reason for Co-Treatment: To address functional/ADL transfers   OT goals addressed during session: ADL's and self-care                       End of Session Equipment Utilized During Treatment: Gait belt;Other (comment) [facility]) Nurse Communication: Mobility status  Activity Tolerance: Patient tolerated treatment  well Patient left: in chair;with call bell/phone within reach;with chair alarm set  OT Visit Diagnosis: Unsteadiness on feet (R26.81);Other abnormalities of gait and mobility (R26.89);Muscle weakness (generalized) (M62.81);History of falling (Z91.81)                Time: 9156-9094 OT Time Calculation (min): 22 min Charges:  OT General Charges $OT Visit: 1 Visit OT Evaluation $OT Eval Low Complexity: 1 Low  Casandra Dallaire OT, MOT  Jayson Person 03/29/2024, 10:59 AM

## 2024-03-29 NOTE — Plan of Care (Signed)
" °  Problem: Acute Rehab OT Goals (only OT should resolve) Goal: Pt. Will Perform Grooming Flowsheets (Taken 03/29/2024 1101) Pt Will Perform Grooming:  with modified independence  standing Goal: Pt. Will Perform Lower Body Dressing Flowsheets (Taken 03/29/2024 1101) Pt Will Perform Lower Body Dressing: with modified independence Goal: Pt. Will Transfer To Toilet Flowsheets (Taken 03/29/2024 1101) Pt Will Transfer to Toilet: with modified independence Goal: Pt. Will Perform Toileting-Clothing Manipulation Flowsheets (Taken 03/29/2024 1101) Pt Will Perform Toileting - Clothing Manipulation and hygiene: with modified independence Goal: Pt/Caregiver Will Perform Home Exercise Program Flowsheets (Taken 03/29/2024 1101) Pt/caregiver will Perform Home Exercise Program:  Increased strength  Increased ROM  Both right and left upper extremity  Independently  Daniell Paradise OT, MOT  "

## 2024-03-29 NOTE — Evaluation (Signed)
 Physical Therapy Evaluation Patient Details Name: Glenda Nguyen MRN: [id number] DOB: 11/20/1934 Today's Date: 03/29/2024  History of Present Illness  Glenda Nguyen is a 89 year old female with a history of hypertension and hyperlipidemia presenting with a fall on the morning of 03/24/2024.  The patient states that she has had some nausea and decreased oral intake for at least the past week.  She was feeling a bit dizzy.  She was getting out of bed and sat on the edge of the bed.  She felt like she fell off the edge of the bed onto her right side.  She denied loss of consciousness.  She did hit her head.  She was weak and had difficulty getting off the floor.  She called her daughter-in-law who called the patient's grandson to come help her.  The patient complains of pain in her right hand which has been chronic.  She went to see orthopedic surgery in October.  There were plans for operation for carpal tunnel.  She has some right shoulder pain.  She denied any fevers, chills, headache, neck pain, chest pain, shortness breath, abdominal pain, Vomiting, diarrhea.  She denies any dysuria or hematuria.  She has not had any hematochezia or melena.  She denies any new medications.  She is a non-smoker.  She does not drink alcohol .  She denies illicit drug use.   Clinical Impression  Patient agreeable to PT/OT co-evaluation. Patient reports at baseline, she is independent with ADLs and a community ambulator without AD most times but uses walking stick intermittently. This date, patient is CGA/sup with bed mobility, and min assist with transfers/ambulation without AD but CGA with SPC. Pt uses SPC in L hand as R hand has increased pain, stiffness and decreased grip strength. Pt reports she is scheduled for surgery to address this. Overall, patient is generally weak, demonstrates decreased fatigue, demo slight balance deficits and has some swelling in BLE. Pt tolerates sitting in chair at end of session,  chair alarm set, call  button in reach and all needs met. Patient will benefit from continued skilled physical therapy acutely and in recommended venue in order to address current deficits and improve overall function.        If plan is discharge home, recommend the following: A little help with walking and/or transfers;Assistance with cooking/housework;Assist for transportation;Help with stairs or ramp for entrance   Can travel by private vehicle        Equipment Recommendations None recommended by PT  Recommendations for Other Services       Functional Status Assessment Patient has had a recent decline in their functional status and demonstrates the ability to make significant improvements in function in a reasonable and predictable amount of time.     Precautions / Restrictions Precautions Precautions: Fall Recall of Precautions/Restrictions: Intact Restrictions Weight Bearing Restrictions Per Provider Order: No      Mobility  Bed Mobility Overal bed mobility: Needs Assistance Bed Mobility: Supine to Sit     Supine to sit: Supervision, Contact guard     General bed mobility comments: slow labored movement, HOB slightly elevated, no use of rails    Transfers Overall transfer level: Needs assistance Equipment used: Straight cane, None Transfers: Sit to/from Stand, Bed to chair/wheelchair/BSC Sit to Stand: Min assist   Step pivot transfers: Min assist, Contact guard assist       General transfer comment: first STS from bed and transfer without AD and min assist for slight unsteadiness, balance improves  with SPC, min assist for STS from toilet    Ambulation/Gait Ambulation/Gait assistance: Contact guard assist Gait Distance (Feet): 50 Feet Assistive device: Straight cane Gait Pattern/deviations: Step-through pattern, Decreased step length - right, Decreased step length - left, Decreased stride length, Trunk flexed, Narrow base of support Gait velocity: Dec      General Gait Details: Pt ambulates in room and hall with CGA and SPC in L hand, mild unsteadiness but no overt LOB, demo slow labored movement, limited due to fatigue  Stairs            Wheelchair Mobility     Tilt Bed    Modified Rankin (Stroke Patients Only)       Balance Overall balance assessment: Needs assistance Sitting-balance support: No upper extremity supported, Feet supported Sitting balance-Leahy Scale: Good Sitting balance - Comments: seated at EOB   Standing balance support: Single extremity supported, During functional activity Standing balance-Leahy Scale: Fair Standing balance comment: fair with SPC                             Pertinent Vitals/Pain Pain Assessment Pain Assessment: Faces Faces Pain Scale: Hurts a little bit Pain Location: R middle finger; Pain Descriptors / Indicators: Stabbing Pain Intervention(s): Monitored during session, Repositioned    Home Living Family/patient expects to be discharged to:: Private residence Living Arrangements: Children;Other relatives Available Help at Discharge: Family;Available 24 hours/day;Friend(s) Type of Home: House Home Access: Stairs to enter Entrance Stairs-Rails: Right;Left;Can reach both Entrance Stairs-Number of Steps: 3   Home Layout: One level Home Equipment: Other (comment) (Has late husbands walking stick) Additional Comments: Lives with daughter-in-law  and grandson, also has friends who can be available to assist if needed    Prior Function Prior Level of Function : Independent/Modified Independent;Driving;History of Falls (last six months)             Mobility Comments: Community ambulator without AD; drives; reported using walking stick as needed to help reach things and boost from a low surfaces such as toilet ADLs Comments: Independent.     Extremity/Trunk Assessment   Upper Extremity Assessment Upper Extremity Assessment: Defer to OT evaluation RUE Deficits  / Details: Pt reports she is having surgery on R middle finger PIP in 2 weeks. Inability to make a full grasp or fist with R hand. RUE Coordination: decreased fine motor    Lower Extremity Assessment Lower Extremity Assessment: Generalized weakness (ankle DF MMT 4/5, knee ext MMT 4/5, hip flexion 4-/5 all bilaterally)    Cervical / Trunk Assessment Cervical / Trunk Assessment: Kyphotic  Communication   Communication Communication: No apparent difficulties    Cognition Arousal: Alert Behavior During Therapy: WFL for tasks assessed/performed                             Following commands: Intact       Cueing Cueing Techniques: Verbal cues, Tactile cues     General Comments General comments (skin integrity, edema, etc.): Inc swelling in BLE of note, bruising to dorsal surface of R foot, bruising on face    Exercises     Assessment/Plan    PT Assessment Patient needs continued PT services;All further PT needs can be met in the next venue of care  PT Problem List Decreased strength;Decreased range of motion;Decreased activity tolerance;Decreased balance;Decreased mobility       PT Treatment Interventions Balance training;DME instruction;Gait  training;Stair training;Functional mobility training;Therapeutic activities;Therapeutic exercise;Patient/family education    PT Goals (Current goals can be found in the Care Plan section)  Acute Rehab PT Goals Patient Stated Goal: Return home PT Goal Formulation: With patient Time For Goal Achievement: 04/02/24 Potential to Achieve Goals: Good    Frequency Min 3X/week     Co-evaluation PT/OT/SLP Co-Evaluation/Treatment: Yes Reason for Co-Treatment: To address functional/ADL transfers PT goals addressed during session: Mobility/safety with mobility OT goals addressed during session: ADL's and self-care       AM-PAC PT 6 Clicks Mobility  Outcome Measure Help needed turning from your back to your side while in a  flat bed without using bedrails?: None Help needed moving from lying on your back to sitting on the side of a flat bed without using bedrails?: A Little Help needed moving to and from a bed to a chair (including a wheelchair)?: A Little Help needed standing up from a chair using your arms (e.g., wheelchair or bedside chair)?: A Little Help needed to walk in hospital room?: A Little Help needed climbing 3-5 steps with a railing? : A Lot 6 Click Score: 18    End of Session Equipment Utilized During Treatment: Gait belt Activity Tolerance: Patient tolerated treatment well;Patient limited by fatigue Patient left: in chair;with call bell/phone within reach;with chair alarm set Nurse Communication: Mobility status PT Visit Diagnosis: Unsteadiness on feet (R26.81);Other abnormalities of gait and mobility (R26.89);History of falling (Z91.81);Muscle weakness (generalized) (M62.81)    Time: 9155-9094 PT Time Calculation (min) (ACUTE ONLY): 21 min   Charges:   PT Evaluation $PT Eval Low Complexity: 1 Low   PT General Charges $$ ACUTE PT VISIT: 1 Visit        11:59 AM, 03/29/2024 Rosaria Settler, PT, DPT [place] with [facility]

## 2024-03-29 NOTE — Progress Notes (Signed)
 "          PROGRESS NOTE  Glenda Nguyen FMW:[id number] DOB: 09-May-1934 DOA: 03/24/2024 PCP: Vick Lurie, FNP (Inactive)  Brief History:  89 year old female with a history of hypertension and hyperlipidemia presenting with a fall on the morning of 03/24/2024.  The patient states that she has had some nausea and decreased oral intake for at least the past week.  She was feeling a bit dizzy.  She was getting out of bed and sat on the edge of the bed.  She felt like she fell off the edge of the bed onto her right side.  She denied loss of consciousness.  She did hit her head.  She was weak and had difficulty getting off the floor.  She called her daughter-in-law who called the patient's grandson to come help her. The patient complains of pain in her right hand which has been chronic.  She went to see orthopedic surgery in October.  There were plans for operation for carpal tunnel.  She has some right shoulder pain. She denied any fevers, chills, headache, neck pain, chest pain, shortness breath, abdominal pain, Vomiting, diarrhea.  She denies any dysuria or hematuria.  She has not had any hematochezia or melena.  She denies any new medications.  She is a non-smoker.  She does not drink alcohol .  She denies illicit drug use.   In the ED, the patient was afebrile and hemodynamically stable with oxygen saturation 95% room air.  She was tachycardic in the 110s.  WBC 17.4, hemoglobin 9.9, platelets 406.  Sodium 110, potassium 4.3, bicarbonate 19, serum creatinine 0.62.  AST 36, ALT 33, alk phosphatase 99, total bilirubin 0.7.  Albumin is 3.3.  Magnesium  1.7.  EKG showed atrial fibrillation with nonspecific ST changes.  proBNP 2551   Chest x-ray was negative for any infiltrates or edema.  There was a deformity to right clavicle.  CT of the brain was negative for any acute findings.  There was right periorbital swelling of the soft tissues.  CT cervical spine was negative for any traumatic listhesis.   X-ray of the right knee was negative for fracture or dislocation.  The patient was given 2 L of fluid in the ED.  She was admitted for further evaluation and treatment of her hyponatremia.    Assessment/Plan: Hyponatremia -Clinically the patient appears to be hypervolemic/fluid overload. - Na 110>>122>>126>>127>>125>>123 - Patient is alert, awake and oriented x 3 and without any acute neurologic complaint. - Urine osmolarity 600 - Serum osmolarity 236 - Urine sodium <30 - Urine creatinine 104 - Will continue IV diuresis and follow renal function trend - Patient hypertonic saline 3% stopped and on hold since 03/27/2024. - Planning to hold Lasix  at the moment and use tolvaptan. - Continue to follow electrolytes.  Fluid Overload/Acute HFpEF - 12/28 Echo--EF 65-70%, no WMA, normal RVF, mild TR - Following nephrology service recommendation will hold Lasix  today and provide treatment with tolvaptan. - Good urine output appreciated; patient's volume status improving (still with signs of fluid overload).   New onset atrial fibrillation with RVR - Patient states that she was told by her orthopedist about 2 months ago that she had atrial fibrillation - She has never followed up with her PCP - TSH 2.760 - 12/28 Echo--EF 65-70%, no WMA, normal RVF, mild TR - CHADSVASC2 = 4 (Age x 2, HTN, female) - started apixaban  -03/26/24--started IV diltiazem >>transition to po diltiazem  -Continue telemetry monitoring - Continue to follow electrolytes and replete as  needed.   Essential hypertension - Continue the use of metoprolol  and diuretics at the moment - Follow vital signs. -Heart healthy diet discussed with patient.   Leukemoid reaction - Blood cultures x 2 sets--neg to date - Obtain UA--0-5 WBC - Personally reviewed chest x-ray--no infiltrates - Check PCT 0.32 - remains afebrile and hemodynamically stable   Pre-Diabetes -12/27 A1C--6.1 -Modified carbohydrate diet has been discussed with  patient. -Continue patient follow-up with PCP.   LUE edema -venous duplex -Negative Doppler for DVT; patient with positive cephalic SVT/thrombophlebitis.     Family Communication:   no Family at bedside   Consultants:  none   Code Status:  FULL    DVT Prophylaxis: apixaban      Procedures: As Listed in Progress Note Above   Antibiotics: None  Subjective: No fever, no chest pain, no nausea, no vomiting.  Good saturation on room air.  Patient reports good urine output.  (Over 3 L overnight)   Objective: Vitals:   03/28/24 2123 03/29/24 0224 03/29/24 0513 03/29/24 0741  BP: (!) 144/73 113/64 130/71   Pulse: 97 67 80   Resp:  18    Temp:  98.4 F (36.9 C) 98.5 F (36.9 C)   TempSrc:  Oral Oral   SpO2:  99% 99% 96%  Weight:   81.5 kg   Height:   5' 7 (1.702 m)     Intake/Output Summary (Last 24 hours) at 03/29/2024 1000 Last data filed at 03/29/2024 0229 Gross per 24 hour  Intake --  Output 3150 ml  Net -3150 ml   Weight change:   Exam: General exam: Alert, awake, oriented x 3; no chest pain, no nausea, no vomiting.  Reports good urine output.  Still with signs of fluid overload on physical exam. Respiratory system: No requiring oxygen supplementation; no using accessory muscles.  Good saturation on room air. Cardiovascular system: Rate controlled, no rubs, no gallops, no JVD. Gastrointestinal system: Abdomen is nondistended, soft and nontender. No organomegaly or masses felt. Normal bowel sounds heard. Central nervous system: Generally weak.  No focal neurological deficits. Extremities: No cyanosis or clubbing; 1-2+ edema appreciated bilaterally (affecting upper and lower extremity). Skin: No petechiae. Psychiatry: Judgement and insight appear normal. Mood & affect appropriate.   Data Reviewed: I have personally reviewed following labs and imaging studies  Basic Metabolic Panel: Recent Labs  Lab 03/24/24 1110 03/24/24 1723 03/25/24 0552 03/25/24 1018  03/26/24 1459 03/26/24 1908 03/27/24 0303 03/27/24 0718 03/27/24 1937 03/27/24 2328 03/28/24 0326 03/28/24 0832 03/29/24 0404  NA 110*   < > 113*   < > 123*   < > 126*   < > 127* 124* 124* 125* 123*  K 4.3  --   --   --  3.7  --  4.4  --   --   --   --  4.0 4.2  CL 77*  --   --   --  89*  --  93*  --   --   --   --  89* 87*  CO2 19*  --   --   --  22  --  29  --   --   --   --  23 31  GLUCOSE 107*  --   --   --  111*  --  90  --   --   --   --  114* 86  BUN 15  --   --   --  11  --  13  --   --   --   --  13 14  CREATININE 0.62  --   --   --  0.67  --  0.62  --   --   --   --  0.67 0.63  CALCIUM 8.5*  --   --   --  8.0*  --  7.9*  --   --   --   --  8.6* 8.0*  MG 1.7  --  2.0  --  1.9  --  2.4  --   --   --   --   --  2.0   < > = values in this interval not displayed.   Liver Function Tests: Recent Labs  Lab 03/24/24 1110  AST 36  ALT 33  ALKPHOS 99  BILITOT 0.7  PROT 6.3*  ALBUMIN 3.3*   Coagulation Profile: No results for input(s): INR, PROTIME in the last 168 hours.  CBC: Recent Labs  Lab 03/24/24 1110 03/25/24 0552  WBC 17.4* 14.5*  NEUTROABS 16.1*  --   HGB 9.9* 10.0*  HCT 27.0* 27.3*  MCV 85.4 86.1  PLT 406* 413*   Urine analysis:    Component Value Date/Time   COLORURINE YELLOW 03/24/2024 1038   APPEARANCEUR CLEAR 03/24/2024 1038   LABSPEC 1.020 03/24/2024 1038   PHURINE 6.0 03/24/2024 1038   GLUCOSEU NEGATIVE 03/24/2024 1038   HGBUR MODERATE (A) 03/24/2024 1038   BILIRUBINUR NEGATIVE 03/24/2024 1038   KETONESUR 5 (A) 03/24/2024 1038   PROTEINUR 30 (A) 03/24/2024 1038   NITRITE NEGATIVE 03/24/2024 1038   LEUKOCYTESUR NEGATIVE 03/24/2024 1038   Sepsis Labs: @LABRCNTIP (procalcitonin:4,lacticidven:4) ) Recent Results (from the past 240 hours)  Resp panel by RT-PCR (RSV, Flu A&B, Covid) Anterior Nasal Swab     Status: None   Collection Time: 03/24/24 12:32 PM   Specimen: Anterior Nasal Swab  Result Value Ref Range Status   SARS Coronavirus  2 by RT PCR NEGATIVE NEGATIVE Final    Comment: (NOTE) SARS-CoV-2 target nucleic acids are NOT DETECTED.  The SARS-CoV-2 RNA is generally detectable in upper respiratory specimens during the acute phase of infection. The lowest concentration of SARS-CoV-2 viral copies this assay can detect is 138 copies/mL. A negative result does not preclude SARS-Cov-2 infection and should not be used as the sole basis for treatment or other patient management decisions. A negative result may occur with  improper specimen collection/handling, submission of specimen other than nasopharyngeal swab, presence of viral mutation(s) within the areas targeted by this assay, and inadequate number of viral copies(<138 copies/mL). A negative result must be combined with clinical observations, patient history, and epidemiological information. The expected result is Negative.  Fact Sheet for Patients:  bloggercourse.com  Fact Sheet for Healthcare Providers:  seriousbroker.it  This test is no t yet approved or cleared by the [place]  FDA and  has been authorized for detection and/or diagnosis of SARS-CoV-2 by FDA under an Emergency Use Authorization (EUA). This EUA will remain  in effect (meaning this test can be used) for the duration of the COVID-19 declaration under Section 564(b)(1) of the Act, 21 U.S.C.section 360bbb-3(b)(1), unless the authorization is terminated  or revoked sooner.       Influenza A by PCR NEGATIVE NEGATIVE Final   Influenza B by PCR NEGATIVE NEGATIVE Final    Comment: (NOTE) The Xpert Xpress SARS-CoV-2/FLU/RSV plus assay is intended as an aid in the diagnosis of influenza from Nasopharyngeal swab specimens and should not be used  as a sole basis for treatment. Nasal washings and aspirates are unacceptable for Xpert Xpress SARS-CoV-2/FLU/RSV testing.  Fact Sheet for Patients: bloggercourse.com  Fact  Sheet for Healthcare Providers: seriousbroker.it  This test is not yet approved or cleared by the [place]  FDA and has been authorized for detection and/or diagnosis of SARS-CoV-2 by FDA under an Emergency Use Authorization (EUA). This EUA will remain in effect (meaning this test can be used) for the duration of the COVID-19 declaration under Section 564(b)(1) of the Act, 21 U.S.C. section 360bbb-3(b)(1), unless the authorization is terminated or revoked.     Resp Syncytial Virus by PCR NEGATIVE NEGATIVE Final    Comment: (NOTE) Fact Sheet for Patients: bloggercourse.com  Fact Sheet for Healthcare Providers: seriousbroker.it  This test is not yet approved or cleared by the [place]  FDA and has been authorized for detection and/or diagnosis of SARS-CoV-2 by FDA under an Emergency Use Authorization (EUA). This EUA will remain in effect (meaning this test can be used) for the duration of the COVID-19 declaration under Section 564(b)(1) of the Act, 21 U.S.C. section 360bbb-3(b)(1), unless the authorization is terminated or revoked.  Performed at [facility], [address] [place], [place] [address]   MRSA Next Gen by PCR, Nasal     Status: None   Collection Time: 03/24/24  3:18 PM   Specimen: Nasal Mucosa; Nasal Swab  Result Value Ref Range Status   MRSA by PCR Next Gen NOT DETECTED NOT DETECTED Final    Comment: (NOTE) The GeneXpert MRSA Assay (FDA approved for NASAL specimens only), is one component of a comprehensive MRSA colonization surveillance program. It is not intended to diagnose MRSA infection nor to guide or monitor treatment for MRSA infections. Test performance is not FDA approved in patients less than 71 years old. Performed at [facility], [address] [place], [place] [address]   Culture, blood (Routine X 2) w Reflex to ID Panel     Status: None   Collection Time: 03/24/24   5:23 PM   Specimen: BLOOD  Result Value Ref Range Status   Specimen Description BLOOD BLOOD RIGHT WRIST  Final   Special Requests   Final    BOTTLES DRAWN AEROBIC AND ANAEROBIC Blood Culture adequate volume   Culture   Final    NO GROWTH 5 DAYS Performed at [facility], [address] [place], [place] [address]    Report Status 03/29/2024 FINAL  Final  Culture, blood (Routine X 2) w Reflex to ID Panel     Status: None   Collection Time: 03/24/24  5:23 PM   Specimen: BLOOD RIGHT HAND  Result Value Ref Range Status   Specimen Description BLOOD RIGHT HAND BLOOD  Final   Special Requests   Final    BOTTLES DRAWN AEROBIC AND ANAEROBIC Blood Culture adequate volume   Culture   Final    NO GROWTH 5 DAYS Performed at [facility], [address] [place], [place] [address]    Report Status 03/29/2024 FINAL  Final     Scheduled Meds:  apixaban   5 mg Oral BID   budesonide  (PULMICORT ) nebulizer solution  0.5 mg Nebulization BID   Chlorhexidine  Gluconate Cloth  6 each Topical Q0600   diltiazem   30 mg Oral Q6H   metoprolol  tartrate  25 mg Oral BID   tolvaptan  15 mg Oral Once   Continuous Infusions: None currently  Procedures/Studies: US  Venous Img Upper Uni Left (DVT) Result Date: 03/27/2024 CLINICAL DATA:  Left  upper extremity edema EXAM: LEFT UPPER EXTREMITY VENOUS DOPPLER ULTRASOUND TECHNIQUE: Gray-scale sonography with graded compression, as well as color Doppler and duplex ultrasound were performed to evaluate the upper extremity deep venous system from the level of the subclavian vein and including the jugular, axillary, basilic, radial, ulnar and upper cephalic vein. Spectral Doppler was utilized to evaluate flow at rest and with distal augmentation maneuvers. COMPARISON:  None Available. FINDINGS: Contralateral Subclavian Vein: Respiratory phasicity is normal and symmetric with the symptomatic side. No evidence of thrombus. Normal compressibility. Internal Jugular Vein: No  evidence of thrombus. Normal compressibility, respiratory phasicity and response to augmentation. Subclavian Vein: No evidence of thrombus. Normal compressibility, respiratory phasicity and response to augmentation. Axillary Vein: No evidence of thrombus. Normal compressibility, respiratory phasicity and response to augmentation. Cephalic Vein: Left cephalic vein at the antecubital fossa demonstrates ill-defined hypoechoic intraluminal thrombus and is noncompressible compatible with superficial thrombosis/thrombophlebitis. Basilic Vein: No evidence of thrombus. Normal compressibility, respiratory phasicity and response to augmentation. Brachial Veins: No evidence of thrombus. Normal compressibility, respiratory phasicity and response to augmentation. Radial Veins: No evidence of thrombus. Normal compressibility, respiratory phasicity and response to augmentation. Ulnar Veins: No evidence of thrombus. Normal compressibility, respiratory phasicity and response to augmentation. IMPRESSION: 1. Negative for left upper extremity DVT. 2. Positive for left cephalic vein superficial thrombosis/thrombophlebitis at the antecubital fossa. Electronically Signed   By: CHRISTELLA.  Shick M.D.   On: 03/27/2024 10:30   ECHOCARDIOGRAM COMPLETE Result Date: 03/25/2024    ECHOCARDIOGRAM REPORT   Patient Name:   Glenda Nguyen Date of Exam: 03/25/2024 Medical Rec #:  [id number]           Height:       67.0 in Accession #:    [id number]          Weight:       188.3 lb Date of Birth:  1934/05/19           BSA:          1.971 m Patient Age:    89 years            BP:           126/57 mmHg Patient Gender: F                   HR:           102 bpm. Exam Location:  Inpatient Procedure: 2D Echo, Cardiac Doppler and Color Doppler (Both Spectral and Color            Flow Doppler were utilized during procedure). Indications:    Atrial fibrillation  History:        Patient has no prior history of Echocardiogram examinations.                  Arrythmias:Atrial Fibrillation; Risk Factors:Hypertension.  Sonographer:    Philomena Daring Referring Phys: [email or web address] DAVID TAT IMPRESSIONS  1. Left ventricular ejection fraction, by estimation, is 65 to 70%. The left ventricle has normal function. The left ventricle has no regional wall motion abnormalities. There is mild concentric left ventricular hypertrophy. Left ventricular diastolic parameters are indeterminate.  2. Right ventricular systolic function is normal. The right ventricular size is normal.  3. The mitral valve is normal in structure. No evidence of mitral valve regurgitation. No evidence of mitral stenosis.  4. The aortic valve was not well visualized. There is mild calcification of the aortic valve. Aortic valve regurgitation is mild. Aortic valve sclerosis  is present, with no evidence of aortic valve stenosis.  5. The inferior vena cava is normal in size with greater than 50% respiratory variability, suggesting right atrial pressure of 3 mmHg. Comparison(s): No prior Echocardiogram. FINDINGS  Left Ventricle: Left ventricular ejection fraction, by estimation, is 65 to 70%. The left ventricle has normal function. The left ventricle has no regional wall motion abnormalities. The left ventricular internal cavity size was normal in size. There is  mild concentric left ventricular hypertrophy. Left ventricular diastolic parameters are indeterminate. Right Ventricle: The right ventricular size is normal. No increase in right ventricular wall thickness. Right ventricular systolic function is normal. Left Atrium: Left atrial size was normal in size. Right Atrium: Right atrial size was normal in size. Pericardium: There is no evidence of pericardial effusion. Mitral Valve: The mitral valve is normal in structure. No evidence of mitral valve regurgitation. No evidence of mitral valve stenosis. Tricuspid Valve: The tricuspid valve is normal in structure. Tricuspid valve regurgitation is mild . No evidence of  tricuspid stenosis. Aortic Valve: The aortic valve was not well visualized. There is mild calcification of the aortic valve. Aortic valve regurgitation is mild. Aortic regurgitation PHT measures 465 msec. Aortic valve sclerosis is present, with no evidence of aortic valve stenosis. Pulmonic Valve: The pulmonic valve was normal in structure. Pulmonic valve regurgitation is mild. No evidence of pulmonic stenosis. Aorta: The aortic root and ascending aorta are structurally normal, with no evidence of dilitation. Venous: The inferior vena cava is normal in size with greater than 50% respiratory variability, suggesting right atrial pressure of 3 mmHg. IAS/Shunts: No atrial level shunt detected by color flow Doppler.  LEFT VENTRICLE PLAX 2D LVIDd:         3.90 cm   Diastology LVIDs:         2.60 cm   LV e' medial:    10.23 cm/s LV PW:         1.10 cm   LV E/e' medial:  9.5 LV IVS:        1.10 cm   LV e' lateral:   14.80 cm/s LVOT diam:     1.80 cm   LV E/e' lateral: 6.5 LV SV:         42 LV SV Index:   22 LVOT Area:     2.54 cm  RIGHT VENTRICLE             IVC RV Basal diam:  2.90 cm     IVC diam: 1.40 cm RV Mid diam:    2.20 cm RV S prime:     14.00 cm/s TAPSE (M-mode): 2.2 cm LEFT ATRIUM             Index        RIGHT ATRIUM           Index LA diam:        3.30 cm 1.67 cm/m   RA Area:     15.70 cm LA Vol (A2C):   46.8 ml 23.74 ml/m  RA Volume:   37.00 ml  18.77 ml/m LA Vol (A4C):   42.7 ml 21.66 ml/m LA Biplane Vol: 48.7 ml 24.71 ml/m  AORTIC VALVE LVOT Vmax:   129.33 cm/s LVOT Vmean:  83.367 cm/s LVOT VTI:    0.167 m AI PHT:      465 msec  AORTA Ao Root diam: 3.20 cm Ao Asc diam:  3.20 cm MITRAL VALVE  TRICUSPID VALVE MV Area (PHT): 4.67 cm    TR Peak grad:   30.9 mmHg MV Decel Time: 163 msec    TR Vmax:        278.00 cm/s MV E velocity: 96.90 cm/s                            SHUNTS                            Systemic VTI:  0.17 m                            Systemic Diam: 1.80 cm Stanly Leavens MD Electronically signed by Stanly Leavens MD Signature Date/Time: 03/25/2024/12:13:57 PM    Final    DG Chest 1 View Result Date: 03/24/2024 CLINICAL DATA:  Weakness and fall. EXAM: CHEST  1 VIEW COMPARISON:  None available. FINDINGS: The heart is mildly enlarged. No pulmonary vascular congestion. Mediastinal silhouette within normal limits. Lungs are clear. Deformity of the lateral RIGHT clavicle suspicious for fracture. IMPRESSION: 1. Mild cardiomegaly. 2. Deformity of the lateral RIGHT clavicle suspicious for fracture. Please correlate for focal tenderness. Electronically Signed   By: Aliene Lloyd M.D.   On: 03/24/2024 14:04   DG Knee Complete 4 Views Right Result Date: 03/24/2024 EXAM: 4 VIEW(S) XRAY OF THE KNEE 03/24/2024 11:07:00 AM COMPARISON: None available. CLINICAL HISTORY: fall FINDINGS: BONES AND JOINTS: No acute fracture. No malalignment. No significant joint effusion. Mild lateral compartment joint space narrowing. Marginal osteophytosis and subchondral sclerosis of the lateral compartment. Additional subtle marginal osteophytosis of the medial compartment. SOFT TISSUES: Mild diffuse soft tissue swelling. IMPRESSION: 1. No acute fracture or dislocation. 2. Mild diffuse soft tissue swelling. 3. Lateral compartment predominant osteoarthritis. Electronically signed by: Donnice Mania MD 03/24/2024 11:27 AM EST RP Workstation: [id number]   CT Cervical Spine Wo Contrast Result Date: 03/24/2024 EXAM: CT CERVICAL SPINE WITHOUT CONTRAST 03/24/2024 10:59:54 AM TECHNIQUE: CT of the cervical spine was performed without the administration of intravenous contrast. Multiplanar reformatted images are provided for review. Automated exposure control, iterative reconstruction, and/or weight based adjustment of the mA/kV was utilized to reduce the radiation dose to as low as reasonably achievable. COMPARISON: None available. CLINICAL HISTORY: Neck trauma (Age >= 65y) FINDINGS: BONES AND  ALIGNMENT: Straightening of the normal cervical lordosis. Trace degenerative anterolisthesis of C2 on C3 and C3 on C4. Additional trace degenerative anterolisthesis of C7 on T1. No acute fracture or traumatic malalignment. DEGENERATIVE CHANGES: Moderate disc space narrowing at C4-C5. Additional disc space narrowing at C5-C6 and C6-C7. Mild degenerative endplate osteophytes at multiple levels. There are disc osteophyte complexes at multiple levels without evidence of high grade osseous spinal canal stenosis. Facet arthrosis and uncovertebral hypertrophy at multiple levels. SOFT TISSUES: No prevertebral soft tissue swelling. LUNGS: Scarring and atelectasis in the right lung apex. IMPRESSION: 1. No evidence of acute traumatic injury. 2. Degenerative changes as above. Electronically signed by: Donnice Mania MD 03/24/2024 11:25 AM EST RP Workstation: [id number]   CT Head Wo Contrast Result Date: 03/24/2024 EXAM: CT HEAD WITHOUT CONTRAST 03/24/2024 10:59:54 AM TECHNIQUE: CT of the head was performed without the administration of intravenous contrast. Automated exposure control, iterative reconstruction, and/or weight based adjustment of the mA/kV was utilized to reduce the radiation dose to as low as reasonably achievable. COMPARISON: None available. CLINICAL HISTORY: Head trauma, minor (  Age >= 65y). FINDINGS: BRAIN AND VENTRICLES: No acute hemorrhage. 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 Mild chronic microvascular ischemic change. No evidence of acute infarct. No hydrocephalus. No extra-axial collection. No mass effect or midline shift. ORBITS: Bilateral lens replacement noted. SINUSES: Trace right mastoid effusion. SOFT TISSUES AND SKULL: Right periorbital soft tissue swelling. No skull fracture. IMPRESSION: 1. No acute intracranial abnormality. 2. Right periorbital soft tissue swelling. 3. Focal encephalomalacia along the  anterolateral left temporal lobe, suggestive of small remote infarct or sequelae of prior trauma. Electronically signed by: Donnice Mania MD 03/24/2024 11:18 AM EST RP Workstation: [id number]    Eric Nunnery, MD  [facility]  If 7PM-7AM, please contact night-coverage [email or web address] Password [email or web address] 03/29/2024, 10:00 AM   LOS: 5 days   "

## 2024-03-29 NOTE — Plan of Care (Signed)
  Problem: Education: Goal: Knowledge of General Education information will improve Description: Including pain rating scale, medication(s)/side effects and non-pharmacologic comfort measures Outcome: Progressing   Problem: Nutrition: Goal: Adequate nutrition will be maintained Outcome: Progressing   Problem: Pain Managment: Goal: General experience of comfort will improve and/or be controlled Outcome: Progressing

## 2024-03-30 DIAGNOSIS — I5031 Acute diastolic (congestive) heart failure: Secondary | ICD-10-CM | POA: Diagnosis not present

## 2024-03-30 DIAGNOSIS — I10 Essential (primary) hypertension: Secondary | ICD-10-CM | POA: Diagnosis not present

## 2024-03-30 DIAGNOSIS — I4891 Unspecified atrial fibrillation: Secondary | ICD-10-CM | POA: Diagnosis not present

## 2024-03-30 DIAGNOSIS — E871 Hypo-osmolality and hyponatremia: Secondary | ICD-10-CM | POA: Diagnosis not present

## 2024-03-30 LAB — BASIC METABOLIC PANEL WITH GFR
Anion gap: 4 — ABNORMAL LOW (ref 5–15)
BUN: 14 mg/dL (ref 8–23)
CO2: 31 mmol/L (ref 22–32)
Calcium: 7.2 mg/dL — ABNORMAL LOW (ref 8.9–10.3)
Chloride: 94 mmol/L — ABNORMAL LOW (ref 98–111)
Creatinine, Ser: 0.61 mg/dL (ref 0.44–1.00)
GFR, Estimated: >60 mL/min
Glucose, Bld: 96 mg/dL (ref 70–99)
Potassium: 4.6 mmol/L (ref 3.5–5.1)
Sodium: 129 mmol/L — ABNORMAL LOW (ref 135–145)

## 2024-03-30 LAB — SODIUM
Sodium: 132 mmol/L — ABNORMAL LOW (ref 135–145)
Sodium: 130 mmol/L — ABNORMAL LOW (ref 135–145)

## 2024-03-30 MED ORDER — MELATONIN 3 MG PO TABS
3.0000 mg | ORAL_TABLET | Freq: Every evening | ORAL | Status: DC | PRN
  Administered 2024-03-30: 3 mg via ORAL
  Filled 2024-03-30: qty 1

## 2024-03-30 MED ORDER — TORSEMIDE 20 MG PO TABS
20.0000 mg | ORAL_TABLET | Freq: Every day | ORAL | Status: DC
  Administered 2024-03-30 – 2024-03-31 (×2): 20 mg via ORAL
  Filled 2024-03-30 (×2): qty 1

## 2024-03-30 NOTE — Progress Notes (Signed)
 "          PROGRESS NOTE  Glenda MCARTHY FMW:[id number] DOB: [birth year] DOA: 03/24/2024 PCP: Vick Lurie, FNP (Inactive)  Brief History:  89 year old female with a history of hypertension and hyperlipidemia presenting with a fall on the morning of 03/24/2024.  The patient states that she has had some nausea and decreased oral intake for at least the past week.  She was feeling a bit dizzy.  She was getting out of bed and sat on the edge of the bed.  She felt like she fell off the edge of the bed onto her right side.  She denied loss of consciousness.  She did hit her head.  She was weak and had difficulty getting off the floor.  She called her daughter-in-law who called the patient's grandson to come help her. The patient complains of pain in her right hand which has been chronic.  She went to see orthopedic surgery in October.  There were plans for operation for carpal tunnel.  She has some right shoulder pain. She denied any fevers, chills, headache, neck pain, chest pain, shortness breath, abdominal pain, Vomiting, diarrhea.  She denies any dysuria or hematuria.  She has not had any hematochezia or melena.  She denies any new medications.  She is a non-smoker.  She does not drink alcohol .  She denies illicit drug use.   In the ED, the patient was afebrile and hemodynamically stable with oxygen saturation 95% room air.  She was tachycardic in the 110s.  WBC 17.4, hemoglobin 9.9, platelets 406.  Sodium 110, potassium 4.3, bicarbonate 19, serum creatinine 0.62.  AST 36, ALT 33, alk phosphatase 99, total bilirubin 0.7.  Albumin is 3.3.  Magnesium  1.7.  EKG showed atrial fibrillation with nonspecific ST changes.  proBNP 2551   Chest x-ray was negative for any infiltrates or edema.  There was a deformity to right clavicle.  CT of the brain was negative for any acute findings.  There was right periorbital swelling of the soft tissues.  CT cervical spine was negative for any traumatic listhesis.   X-ray of the right knee was negative for fracture or dislocation.  The patient was given 2 L of fluid in the ED.  She was admitted for further evaluation and treatment of her hyponatremia.    Assessment/Plan: Hyponatremia -Clinically the patient appears to be hypervolemic/fluid overload. - Na 110>>122>>126>>127>>125>>123 - Patient is alert, awake and oriented x 3 and without any acute neurologic complaint. - Urine osmolarity 600 - Serum osmolarity 236 - Urine sodium <30 - Urine creatinine 104 - Will continue continue to follow electrolytes trend. -Continue fluid restriction. - Patient hypertonic saline 3% stopped and on hold since 03/27/2024. - Status post tolvaptan on 03/29/2024; patient's sodium level up to 130 - Continue to follow nephrology service recommendations and further management (especially diuretic therapy).  Fluid Overload/Acute HFpEF - 12/28 Echo--EF 65-70%, no WMA, normal RVF, mild TR - Following nephrology service recommendation will hold Lasix  today and provide treatment with tolvaptan. - Good urine output appreciated; patient's volume status improving (still with signs of fluid overload on exam).   New onset atrial fibrillation with RVR - Patient states that she was told by her orthopedist about 2 months ago that she had atrial fibrillation - She has never followed up with her PCP - TSH 2.760 - 12/28 Echo--EF 65-70%, no WMA, normal RVF, mild TR - CHADSVASC2 = 4 (Age x 2, HTN, female) - started apixaban  -03/26/24--started IV diltiazem >>transition to po  diltiazem ; so far rate has remained controlled -Continue telemetry monitoring - Continue to follow electrolytes and replete as needed.   Essential hypertension - Continue the use of metoprolol  and diuretics at the moment - Follow vital signs. -Heart healthy diet discussed with patient.   Leukemoid reaction - Blood cultures x 2 sets--neg to date - Obtain UA--0-5 WBC - Personally reviewed chest x-ray--no  infiltrates - Check PCT 0.32 - remains afebrile and hemodynamically stable   Pre-Diabetes -12/27 A1C--6.1 -Modified carbohydrate diet has been discussed with patient. -Continue patient follow-up with PCP.   LUE edema -venous duplex -Negative Doppler for DVT; patient with positive cephalic SVT/thrombophlebitis.     Family Communication:   no Family at bedside   Consultants:  none   Code Status:  FULL    DVT Prophylaxis: apixaban      Procedures: As Listed in Progress Note Above   Antibiotics: None  Subjective: Continues to report good urine output; no chest pain, no nausea, no vomiting.  Reports feeling better overall.  Objective: Vitals:   03/29/24 2359 03/30/24 0407 03/30/24 0431 03/30/24 0845  BP: 108/81 129/79  123/68  Pulse: 86 86  95  Resp:  18    Temp: 98.8 F (37.1 C) 98.5 F (36.9 C)    TempSrc: Oral Oral    SpO2: 97% 97%    Weight:   80.7 kg   Height:        Intake/Output Summary (Last 24 hours) at 03/30/2024 1012 Last data filed at 03/30/2024 0825 Gross per 24 hour  Intake 1200 ml  Output 2900 ml  Net -1700 ml   Weight change: -0.8 kg  Exam: General exam: Alert, awake, oriented x 3; in no acute distress. Respiratory system: Clear to auscultation. Respiratory effort normal.  Good saturation on room air. Cardiovascular system: Rate rate controlled, no rubs, no gallops, no JVD. Gastrointestinal system: Abdomen is nondistended, soft and nontender. No organomegaly or masses felt. Normal bowel sounds heard. Central nervous system: Alert and oriented. No focal neurological deficits. Extremities: No cyanosis or clubbing; 1-2+ edema appreciated bilaterally. Skin: No petechiae. Psychiatry: Judgement and insight appear normal. Mood & affect appropriate.   Data Reviewed: I have personally reviewed following labs and imaging studies  Basic Metabolic Panel: Recent Labs  Lab 03/24/24 1110 03/24/24 1723 03/25/24 0552 03/25/24 1018 03/26/24 1459  03/26/24 1908 03/27/24 0303 03/27/24 0718 03/28/24 0832 03/29/24 0404 03/29/24 1805 03/30/24 0131 03/30/24 0748  NA 110*   < > 113*   < > 123*   < > 126*   < > 125* 123* 130* 129* 132*  K 4.3  --   --   --  3.7  --  4.4  --  4.0 4.2  --  4.6  --   CL 77*  --   --   --  89*  --  93*  --  89* 87*  --  94*  --   CO2 19*  --   --   --  22  --  29  --  23 31  --  31  --   GLUCOSE 107*  --   --   --  111*  --  90  --  114* 86  --  96  --   BUN 15  --   --   --  11  --  13  --  13 14  --  14  --   CREATININE 0.62  --   --   --  0.67  --  0.62  --  0.67 0.63  --  0.61  --   CALCIUM 8.5*  --   --   --  8.0*  --  7.9*  --  8.6* 8.0*  --  7.2*  --   MG 1.7  --  2.0  --  1.9  --  2.4  --   --  2.0  --   --   --    < > = values in this interval not displayed.   Liver Function Tests: Recent Labs  Lab 03/24/24 1110  AST 36  ALT 33  ALKPHOS 99  BILITOT 0.7  PROT 6.3*  ALBUMIN 3.3*   Coagulation Profile: No results for input(s): INR, PROTIME in the last 168 hours.  CBC: Recent Labs  Lab 03/24/24 1110 03/25/24 0552  WBC 17.4* 14.5*  NEUTROABS 16.1*  --   HGB 9.9* 10.0*  HCT 27.0* 27.3*  MCV 85.4 86.1  PLT 406* 413*   Urine analysis:    Component Value Date/Time   COLORURINE YELLOW 03/24/2024 1038   APPEARANCEUR CLEAR 03/24/2024 1038   LABSPEC 1.020 03/24/2024 1038   PHURINE 6.0 03/24/2024 1038   GLUCOSEU NEGATIVE 03/24/2024 1038   HGBUR MODERATE (A) 03/24/2024 1038   BILIRUBINUR NEGATIVE 03/24/2024 1038   KETONESUR 5 (A) 03/24/2024 1038   PROTEINUR 30 (A) 03/24/2024 1038   NITRITE NEGATIVE 03/24/2024 1038   LEUKOCYTESUR NEGATIVE 03/24/2024 1038   Sepsis Labs: @LABRCNTIP (procalcitonin:4,lacticidven:4) ) Recent Results (from the past 240 hours)  Resp panel by RT-PCR (RSV, Flu A&B, Covid) Anterior Nasal Swab     Status: None   Collection Time: 03/24/24 12:32 PM   Specimen: Anterior Nasal Swab  Result Value Ref Range Status   SARS Coronavirus 2 by RT PCR NEGATIVE  NEGATIVE Final    Comment: (NOTE) SARS-CoV-2 target nucleic acids are NOT DETECTED.  The SARS-CoV-2 RNA is generally detectable in upper respiratory specimens during the acute phase of infection. The lowest concentration of SARS-CoV-2 viral copies this assay can detect is 138 copies/mL. A negative result does not preclude SARS-Cov-2 infection and should not be used as the sole basis for treatment or other patient management decisions. A negative result may occur with  improper specimen collection/handling, submission of specimen other than nasopharyngeal swab, presence of viral mutation(s) within the areas targeted by this assay, and inadequate number of viral copies(<138 copies/mL). A negative result must be combined with clinical observations, patient history, and epidemiological information. The expected result is Negative.  Fact Sheet for Patients:  bloggercourse.com  Fact Sheet for Healthcare Providers:  seriousbroker.it  This test is no t yet approved or cleared by the [place]  FDA and  has been authorized for detection and/or diagnosis of SARS-CoV-2 by FDA under an Emergency Use Authorization (EUA). This EUA will remain  in effect (meaning this test can be used) for the duration of the COVID-19 declaration under Section 564(b)(1) of the Act, 21 U.S.C.section 360bbb-3(b)(1), unless the authorization is terminated  or revoked sooner.       Influenza A by PCR NEGATIVE NEGATIVE Final   Influenza B by PCR NEGATIVE NEGATIVE Final    Comment: (NOTE) The Xpert Xpress SARS-CoV-2/FLU/RSV plus assay is intended as an aid in the diagnosis of influenza from Nasopharyngeal swab specimens and should not be used as a sole basis for treatment. Nasal washings and aspirates are unacceptable for Xpert Xpress SARS-CoV-2/FLU/RSV testing.  Fact Sheet for Patients: bloggercourse.com  Fact Sheet for Healthcare  Providers: seriousbroker.it  This test is not yet approved or cleared by the [place]  FDA and has been authorized for detection and/or diagnosis of SARS-CoV-2 by FDA under an Emergency Use Authorization (EUA). This EUA will remain in effect (meaning this test can be used) for the duration of the COVID-19 declaration under Section 564(b)(1) of the Act, 21 U.S.C. section 360bbb-3(b)(1), unless the authorization is terminated or revoked.     Resp Syncytial Virus by PCR NEGATIVE NEGATIVE Final    Comment: (NOTE) Fact Sheet for Patients: bloggercourse.com  Fact Sheet for Healthcare Providers: seriousbroker.it  This test is not yet approved or cleared by the [place]  FDA and has been authorized for detection and/or diagnosis of SARS-CoV-2 by FDA under an Emergency Use Authorization (EUA). This EUA will remain in effect (meaning this test can be used) for the duration of the COVID-19 declaration under Section 564(b)(1) of the Act, 21 U.S.C. section 360bbb-3(b)(1), unless the authorization is terminated or revoked.  Performed at [facility], [address] [place], [place] [address]   MRSA Next Gen by PCR, Nasal     Status: None   Collection Time: 03/24/24  3:18 PM   Specimen: Nasal Mucosa; Nasal Swab  Result Value Ref Range Status   MRSA by PCR Next Gen NOT DETECTED NOT DETECTED Final    Comment: (NOTE) The GeneXpert MRSA Assay (FDA approved for NASAL specimens only), is one component of a comprehensive MRSA colonization surveillance program. It is not intended to diagnose MRSA infection nor to guide or monitor treatment for MRSA infections. Test performance is not FDA approved in patients less than 75 years old. Performed at [facility], [address] [place], [place] [address]   Culture, blood (Routine X 2) w Reflex to ID Panel     Status: None   Collection Time: 03/24/24  5:23 PM    Specimen: BLOOD  Result Value Ref Range Status   Specimen Description BLOOD BLOOD RIGHT WRIST  Final   Special Requests   Final    BOTTLES DRAWN AEROBIC AND ANAEROBIC Blood Culture adequate volume   Culture   Final    NO GROWTH 5 DAYS Performed at [facility], [address], [place], [place] [address]    Report Status 03/29/2024 FINAL  Final  Culture, blood (Routine X 2) w Reflex to ID Panel     Status: None   Collection Time: 03/24/24  5:23 PM   Specimen: BLOOD RIGHT HAND  Result Value Ref Range Status   Specimen Description BLOOD RIGHT HAND BLOOD  Final   Special Requests   Final    BOTTLES DRAWN AEROBIC AND ANAEROBIC Blood Culture adequate volume   Culture   Final    NO GROWTH 5 DAYS Performed at [facility], [address], [place], [place] [address]    Report Status 03/29/2024 FINAL  Final     Scheduled Meds:  apixaban   5 mg Oral BID   budesonide  (PULMICORT ) nebulizer solution  0.5 mg Nebulization BID   Chlorhexidine  Gluconate Cloth  6 each Topical Q0600   diltiazem   30 mg Oral Q6H   metoprolol  tartrate  25 mg Oral BID   Continuous Infusions: None currently  Procedures/Studies: US  Venous Img Upper Uni Left (DVT) Result Date: 03/27/2024 CLINICAL DATA:  Left upper extremity edema EXAM: LEFT UPPER EXTREMITY VENOUS DOPPLER ULTRASOUND TECHNIQUE: Gray-scale sonography with graded compression, as well as color Doppler and duplex ultrasound were performed to evaluate the upper extremity deep venous system from the level of the subclavian  vein and including the jugular, axillary, basilic, radial, ulnar and upper cephalic vein. Spectral Doppler was utilized to evaluate flow at rest and with distal augmentation maneuvers. COMPARISON:  None Available. FINDINGS: Contralateral Subclavian Vein: Respiratory phasicity is normal and symmetric with the symptomatic side. No evidence of thrombus. Normal compressibility. Internal Jugular Vein: No evidence of thrombus. Normal compressibility,  respiratory phasicity and response to augmentation. Subclavian Vein: No evidence of thrombus. Normal compressibility, respiratory phasicity and response to augmentation. Axillary Vein: No evidence of thrombus. Normal compressibility, respiratory phasicity and response to augmentation. Cephalic Vein: Left cephalic vein at the antecubital fossa demonstrates ill-defined hypoechoic intraluminal thrombus and is noncompressible compatible with superficial thrombosis/thrombophlebitis. Basilic Vein: No evidence of thrombus. Normal compressibility, respiratory phasicity and response to augmentation. Brachial Veins: No evidence of thrombus. Normal compressibility, respiratory phasicity and response to augmentation. Radial Veins: No evidence of thrombus. Normal compressibility, respiratory phasicity and response to augmentation. Ulnar Veins: No evidence of thrombus. Normal compressibility, respiratory phasicity and response to augmentation. IMPRESSION: 1. Negative for left upper extremity DVT. 2. Positive for left cephalic vein superficial thrombosis/thrombophlebitis at the antecubital fossa. Electronically Signed   By: CHRISTELLA.  Shick M.D.   On: 03/27/2024 10:30   ECHOCARDIOGRAM COMPLETE Result Date: 03/25/2024    ECHOCARDIOGRAM REPORT   Patient Name:   JAYNE PECKENPAUGH Date of Exam: 03/25/2024 Medical Rec #:  [id number]           Height:       67.0 in Accession #:    [id number]          Weight:       188.3 lb Date of Birth:  April 22, 1934           BSA:          1.971 m Patient Age:    89 years            BP:           126/57 mmHg Patient Gender: F                   HR:           102 bpm. Exam Location:  Inpatient Procedure: 2D Echo, Cardiac Doppler and Color Doppler (Both Spectral and Color            Flow Doppler were utilized during procedure). Indications:    Atrial fibrillation  History:        Patient has no prior history of Echocardiogram examinations.                 Arrythmias:Atrial Fibrillation; Risk  Factors:Hypertension.  Sonographer:    Philomena Daring Referring Phys: [email or web address].[email or web address] DAVID TAT IMPRESSIONS  1. Left ventricular ejection fraction, by estimation, is 65 to 70%. The left ventricle has normal function. The left ventricle has no regional wall motion abnormalities. There is mild concentric left ventricular hypertrophy. Left ventricular diastolic parameters are indeterminate.  2. Right ventricular systolic function is normal. The right ventricular size is normal.  3. The mitral valve is normal in structure. No evidence of mitral valve regurgitation. No evidence of mitral stenosis.  4. The aortic valve was not well visualized. There is mild calcification of the aortic valve. Aortic valve regurgitation is mild. Aortic valve sclerosis is present, with no evidence of aortic valve stenosis.  5. The inferior vena cava is normal in size with greater than 50% respiratory variability, suggesting right atrial pressure of 3 mmHg. Comparison(s): No prior Echocardiogram. FINDINGS  Left Ventricle:  Left ventricular ejection fraction, by estimation, is 65 to 70%. The left ventricle has normal function. The left ventricle has no regional wall motion abnormalities. The left ventricular internal cavity size was normal in size. There is  mild concentric left ventricular hypertrophy. Left ventricular diastolic parameters are indeterminate. Right Ventricle: The right ventricular size is normal. No increase in right ventricular wall thickness. Right ventricular systolic function is normal. Left Atrium: Left atrial size was normal in size. Right Atrium: Right atrial size was normal in size. Pericardium: There is no evidence of pericardial effusion. Mitral Valve: The mitral valve is normal in structure. No evidence of mitral valve regurgitation. No evidence of mitral valve stenosis. Tricuspid Valve: The tricuspid valve is normal in structure. Tricuspid valve regurgitation is mild . No evidence of tricuspid stenosis. Aortic Valve: The aortic  valve was not well visualized. There is mild calcification of the aortic valve. Aortic valve regurgitation is mild. Aortic regurgitation PHT measures 465 msec. Aortic valve sclerosis is present, with no evidence of aortic valve stenosis. Pulmonic Valve: The pulmonic valve was normal in structure. Pulmonic valve regurgitation is mild. No evidence of pulmonic stenosis. Aorta: The aortic root and ascending aorta are structurally normal, with no evidence of dilitation. Venous: The inferior vena cava is normal in size with greater than 50% respiratory variability, suggesting right atrial pressure of 3 mmHg. IAS/Shunts: No atrial level shunt detected by color flow Doppler.  LEFT VENTRICLE PLAX 2D LVIDd:         3.90 cm   Diastology LVIDs:         2.60 cm   LV e' medial:    10.23 cm/s LV PW:         1.10 cm   LV E/e' medial:  9.5 LV IVS:        1.10 cm   LV e' lateral:   14.80 cm/s LVOT diam:     1.80 cm   LV E/e' lateral: 6.5 LV SV:         42 LV SV Index:   22 LVOT Area:     2.54 cm  RIGHT VENTRICLE             IVC RV Basal diam:  2.90 cm     IVC diam: 1.40 cm RV Mid diam:    2.20 cm RV S prime:     14.00 cm/s TAPSE (M-mode): 2.2 cm LEFT ATRIUM             Index        RIGHT ATRIUM           Index LA diam:        3.30 cm 1.67 cm/m   RA Area:     15.70 cm LA Vol (A2C):   46.8 ml 23.74 ml/m  RA Volume:   37.00 ml  18.77 ml/m LA Vol (A4C):   42.7 ml 21.66 ml/m LA Biplane Vol: 48.7 ml 24.71 ml/m  AORTIC VALVE LVOT Vmax:   129.33 cm/s LVOT Vmean:  83.367 cm/s LVOT VTI:    0.167 m AI PHT:      465 msec  AORTA Ao Root diam: 3.20 cm Ao Asc diam:  3.20 cm MITRAL VALVE               TRICUSPID VALVE MV Area (PHT): 4.67 cm    TR Peak grad:   30.9 mmHg MV Decel Time: 163 msec    TR Vmax:        278.00  cm/s MV E velocity: 96.90 cm/s                            SHUNTS                            Systemic VTI:  0.17 m                            Systemic Diam: 1.80 cm Stanly Leavens MD Electronically signed by Stanly Leavens MD Signature Date/Time: 03/25/2024/12:13:57 PM    Final    DG Chest 1 View Result Date: 03/24/2024 CLINICAL DATA:  Weakness and fall. EXAM: CHEST  1 VIEW COMPARISON:  None available. FINDINGS: The heart is mildly enlarged. No pulmonary vascular congestion. Mediastinal silhouette within normal limits. Lungs are clear. Deformity of the lateral RIGHT clavicle suspicious for fracture. IMPRESSION: 1. Mild cardiomegaly. 2. Deformity of the lateral RIGHT clavicle suspicious for fracture. Please correlate for focal tenderness. Electronically Signed   By: Aliene Lloyd M.D.   On: 03/24/2024 14:04   DG Knee Complete 4 Views Right Result Date: 03/24/2024 EXAM: 4 VIEW(S) XRAY OF THE KNEE 03/24/2024 11:07:00 AM COMPARISON: None available. CLINICAL HISTORY: fall FINDINGS: BONES AND JOINTS: No acute fracture. No malalignment. No significant joint effusion. Mild lateral compartment joint space narrowing. Marginal osteophytosis and subchondral sclerosis of the lateral compartment. Additional subtle marginal osteophytosis of the medial compartment. SOFT TISSUES: Mild diffuse soft tissue swelling. IMPRESSION: 1. No acute fracture or dislocation. 2. Mild diffuse soft tissue swelling. 3. Lateral compartment predominant osteoarthritis. Electronically signed by: Donnice Mania MD 03/24/2024 11:27 AM EST RP Workstation: [id number]   CT Cervical Spine Wo Contrast Result Date: 03/24/2024 EXAM: CT CERVICAL SPINE WITHOUT CONTRAST 03/24/2024 10:59:54 AM TECHNIQUE: CT of the cervical spine was performed without the administration of intravenous contrast. Multiplanar reformatted images are provided for review. Automated exposure control, iterative reconstruction, and/or weight based adjustment of the mA/kV was utilized to reduce the radiation dose to as low as reasonably achievable. COMPARISON: None available. CLINICAL HISTORY: Neck trauma (Age >= 65y) FINDINGS: BONES AND ALIGNMENT: Straightening of the normal cervical  lordosis. Trace degenerative anterolisthesis of C2 on C3 and C3 on C4. Additional trace degenerative anterolisthesis of C7 on T1. No acute fracture or traumatic malalignment. DEGENERATIVE CHANGES: Moderate disc space narrowing at C4-C5. Additional disc space narrowing at C5-C6 and C6-C7. Mild degenerative endplate osteophytes at multiple levels. There are disc osteophyte complexes at multiple levels without evidence of high grade osseous spinal canal stenosis. Facet arthrosis and uncovertebral hypertrophy at multiple levels. SOFT TISSUES: No prevertebral soft tissue swelling. LUNGS: Scarring and atelectasis in the right lung apex. IMPRESSION: 1. No evidence of acute traumatic injury. 2. Degenerative changes as above. Electronically signed by: Donnice Mania MD 03/24/2024 11:25 AM EST RP Workstation: [id number]   CT Head Wo Contrast Result Date: 03/24/2024 EXAM: CT HEAD WITHOUT CONTRAST 03/24/2024 10:59:54 AM TECHNIQUE: CT of the head was performed without the administration of intravenous contrast. Automated exposure control, iterative reconstruction, and/or weight based adjustment of the mA/kV was utilized to reduce the radiation dose to as low as reasonably achievable. COMPARISON: None available. CLINICAL HISTORY: Head trauma, minor (Age >= 65y). FINDINGS: BRAIN AND VENTRICLES: No acute hemorrhage. 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 Mild chronic microvascular ischemic change. No evidence of  acute infarct. No hydrocephalus. No extra-axial collection. No mass effect or midline shift. ORBITS: Bilateral lens replacement noted. SINUSES: Trace right mastoid effusion. SOFT TISSUES AND SKULL: Right periorbital soft tissue swelling. No skull fracture. IMPRESSION: 1. No acute intracranial abnormality. 2. Right periorbital soft tissue swelling. 3. Focal encephalomalacia along the anterolateral left temporal lobe, suggestive of small  remote infarct or sequelae of prior trauma. Electronically signed by: Donnice Mania MD 03/24/2024 11:18 AM EST RP Workstation: [id number]    Eric Nunnery, MD  [facility]  If 7PM-7AM, please contact night-coverage [email or web address] Password [email or web address] 03/30/2024, 10:12 AM   LOS: 6 days   "

## 2024-03-30 NOTE — Discharge Instructions (Addendum)

## 2024-03-30 NOTE — Plan of Care (Signed)

## 2024-03-30 NOTE — Progress Notes (Signed)
 Mobility Specialist Progress Note:    03/30/24 0925  Oxygen Therapy  SpO2 98 %  O2 Device Room Air  Mobility  Activity Ambulated with assistance  Level of Assistance Contact guard assist, steadying assist  Assistive Device Cane  Distance Ambulated (ft) 60 ft  Range of Motion/Exercises Active;All extremities  Activity Response Tolerated well  Mobility Referral Yes  Mobility visit 1 Mobility  Mobility Specialist Start Time (ACUTE ONLY) [id number]  Mobility Specialist Stop Time (ACUTE ONLY) 0945  Mobility Specialist Time Calculation (min) (ACUTE ONLY) 20 min   Pt received supine, agreeable to mobility. Required CGA to stand and ambulate with single point cane. Tolerated well, c/o BLE swelling. Returned supine, all needs met.  Wynne Jury Mobility Specialist Please contact via Special Educational Needs Teacher or  Rehab office at [phone]

## 2024-03-30 NOTE — TOC Progression Note (Addendum)
 Transition of Care [facility]) - Progression Note    Patient Details  Name: Glenda Nguyen MRN: [id number] Date of Birth: 02-Aug-1934  Transition of Care [facility]) CM/SW Contact  Ronnald MARLA Sil, RN Phone Number: 03/30/2024, 2:24 PM  Clinical Narrative:    CM conducted follow-up review, noted PT recommendations and the current [facility] discharge plan for patient to return home with [facility] & OT, along with a BSC from [facility].  CM verified HH order entered earlier today for PT & OT, and a DME order for 3-in-1 BSC was entered yesterday.  Contacted patient by phone at bedside and she confirmed the [facility] was delivered to her room yesterday and stated I got it right here  CM electronically forwarded [facility] & DME orders to their perspective agencies via [employer].  CM team will continue to follow along and assist as appropriate with discharge plans and arrangements.   Expected Discharge Plan: Home w Home Health Services Barriers to Discharge: Continued Medical Work up   Expected Discharge Plan and Services In-house Referral: Clinical Social Work Post Acute Care Choice: Home Health, Durable Medical Equipment Living arrangements for the past 2 months: Single Family Home      DME Arranged: Bedside commode DME Agency: [facility] Date DME Agency Contacted: 03/29/24 Time DME Agency Contacted: 1124 Representative spoke with at DME Agency: [facility] Arranged: PT, OT HH Agency: [facility] Date [facility] Agency Contacted: 03/29/24 Time HH Agency Contacted: [phone] Representative spoke with at [facility] Agency: [place]   Social Drivers of Health (SDOH) Interventions SDOH Screenings   Food Insecurity: No Food Insecurity (03/24/2024)  Housing: Low Risk (03/24/2024)  Transportation Needs: No Transportation Needs (03/24/2024)  Utilities: Not At Risk (03/24/2024)  Social Connections: Unknown (03/24/2024)  Tobacco Use: Low Risk (03/24/2024)    Readmission Risk Interventions    03/29/2024   11:22 AM   Readmission Risk Prevention Plan  Post Dischage Appt Complete  Medication Screening Complete  Transportation Screening Complete

## 2024-03-30 NOTE — Progress Notes (Signed)
 " [facility] Progress Note   Subjective:    Na up to 132 after tolvaptan yesterday Remains edematous in the lower extremities 2.9 L UOP yesterday at least, -10 L from admission but weights down may be 5 kg. Normal GFR and most recent albumin of 3.3 Blood pressures normotensive on diltiazem  and metoprolol   Objective Vitals:   03/30/24 0407 03/30/24 0431 03/30/24 0845 03/30/24 0925  BP: 129/79  123/68   Pulse: 86  95   Resp: 18     Temp: 98.5 F (36.9 C)     TempSrc: Oral     SpO2: 97%   98%  Weight:  80.7 kg    Height:       Physical Exam General: well appearing sitting up in chair Heart:RRR Lungs:clear Abdomen: soft Extremities:1+ diffuse edema improving  Neuro: nonfocal  Additional Objective Labs: Basic Metabolic Panel: Recent Labs  Lab 03/28/24 0832 03/29/24 0404 03/29/24 1805 03/30/24 0131 03/30/24 0748  NA 125* 123* 130* 129* 132*  K 4.0 4.2  --  4.6  --   CL 89* 87*  --  94*  --   CO2 23 31  --  31  --   GLUCOSE 114* 86  --  96  --   BUN 13 14  --  14  --   CREATININE 0.67 0.63  --  0.61  --   CALCIUM 8.6* 8.0*  --  7.2*  --    Liver Function Tests: Recent Labs  Lab 03/24/24 1110  AST 36  ALT 33  ALKPHOS 99  BILITOT 0.7  PROT 6.3*  ALBUMIN 3.3*   No results for input(s): LIPASE, AMYLASE in the last 168 hours. CBC: Recent Labs  Lab 03/24/24 1110 03/25/24 0552  WBC 17.4* 14.5*  NEUTROABS 16.1*  --   HGB 9.9* 10.0*  HCT 27.0* 27.3*  MCV 85.4 86.1  PLT 406* 413*   Blood Culture    Component Value Date/Time   SDES BLOOD BLOOD RIGHT WRIST 03/24/2024 1723   SDES BLOOD RIGHT HAND BLOOD 03/24/2024 1723   SPECREQUEST  03/24/2024 1723    BOTTLES DRAWN AEROBIC AND ANAEROBIC Blood Culture adequate volume   SPECREQUEST  03/24/2024 1723    BOTTLES DRAWN AEROBIC AND ANAEROBIC Blood Culture adequate volume   CULT  03/24/2024 1723    NO GROWTH 5 DAYS Performed at [facility], [address] [place], [place] [address]     CULT  03/24/2024 1723    NO GROWTH 5 DAYS Performed at [facility], [address], [place], [place] [address]    REPTSTATUS 03/29/2024 FINAL 03/24/2024 1723   REPTSTATUS 03/29/2024 FINAL 03/24/2024 1723    Cardiac Enzymes: No results for input(s): CKTOTAL, CKMB, CKMBINDEX, TROPONINI in the last 168 hours. CBG: No results for input(s): GLUCAP in the last 168 hours. Iron Studies:  No results for input(s): IRON, TIBC, TRANSFERRIN, FERRITIN in the last 72 hours.  @lablastinr3 @ Studies/Results: No results found.  Medications:    apixaban   5 mg Oral BID   budesonide  (PULMICORT ) nebulizer solution  0.5 mg Nebulization BID   Chlorhexidine  Gluconate Cloth  6 each Topical Q0600   diltiazem   30 mg Oral Q6H   metoprolol  tartrate  25 mg Oral BID    Assessment/Plan: Severe Acute on Chronic Hyponatremia, improving -Improved after tolvaptan dose yesterday.  No further dosing necessary. -Resume diuresis, torsemide 20 mg every morning -studies suggest a component of SIADH and hypervolemia - Check sodium this afternoon, if stable tomorrow likely can  discharge home with ongoing fluid and sodium restriction. - 1.5 L fluid restriction  Edema: TTE 02/2024 EF  65-70%, mild LVH.  Diastolic fnc indeterminate.   UA 1+ protein, doubt nephrotic syndrome, no proteinuria on UPC. -diuresis as above -monitor strict I/O, daily weights   New onset Afib -mgmt per primary service -on eliquis , diltiazem , and metoprolol  - has been rate controlled generally   HTN -BP labile but overall acceptable, monitor for now   Anemia: Hb 10s, mild; per primary.  D/w primary, will follow, reach out with concerns.  Bernardino KATHEE Gasman, MD  03/30/2024, 10:56 AM  [facility]   "

## 2024-03-31 DIAGNOSIS — E871 Hypo-osmolality and hyponatremia: Secondary | ICD-10-CM | POA: Diagnosis not present

## 2024-03-31 DIAGNOSIS — I5031 Acute diastolic (congestive) heart failure: Secondary | ICD-10-CM | POA: Diagnosis not present

## 2024-03-31 DIAGNOSIS — I10 Essential (primary) hypertension: Secondary | ICD-10-CM | POA: Diagnosis not present

## 2024-03-31 DIAGNOSIS — R531 Weakness: Secondary | ICD-10-CM

## 2024-03-31 DIAGNOSIS — I4891 Unspecified atrial fibrillation: Secondary | ICD-10-CM | POA: Diagnosis not present

## 2024-03-31 LAB — BASIC METABOLIC PANEL WITH GFR
Anion gap: 4 — ABNORMAL LOW (ref 5–15)
BUN: 15 mg/dL (ref 8–23)
CO2: 33 mmol/L — ABNORMAL HIGH (ref 22–32)
Calcium: 8.3 mg/dL — ABNORMAL LOW (ref 8.9–10.3)
Chloride: 93 mmol/L — ABNORMAL LOW (ref 98–111)
Creatinine, Ser: 0.71 mg/dL (ref 0.44–1.00)
GFR, Estimated: >60 mL/min
Glucose, Bld: 85 mg/dL (ref 70–99)
Potassium: 4.4 mmol/L (ref 3.5–5.1)
Sodium: 129 mmol/L — ABNORMAL LOW (ref 135–145)

## 2024-03-31 MED ORDER — DILTIAZEM HCL ER COATED BEADS 120 MG PO CP24: 120.0000 mg | ORAL_CAPSULE | Freq: Every day | ORAL | 2 refills | Status: DC

## 2024-03-31 MED ORDER — TORSEMIDE 20 MG PO TABS: 30.0000 mg | ORAL_TABLET | Freq: Every day | ORAL | 2 refills | Status: DC

## 2024-03-31 MED ORDER — APIXABAN 5 MG PO TABS: 5.0000 mg | ORAL_TABLET | Freq: Two times a day (BID) | ORAL | 2 refills | Status: AC

## 2024-03-31 MED ORDER — METOPROLOL TARTRATE 25 MG PO TABS: 25.0000 mg | ORAL_TABLET | Freq: Two times a day (BID) | ORAL | 3 refills | Status: DC

## 2024-03-31 NOTE — Discharge Summary (Signed)
 " Physician Discharge Summary   Patient: Glenda Nguyen MRN: [id number] DOB: 03/06/1935  Admit date:     03/24/2024  Discharge date: 03/31/2024  Discharge Physician: Eric Nunnery   PCP: Vick Lurie, FNP (Inactive)   Recommendations at discharge:  Repeat basic metabolic panel to follow electrolytes and renal function Reassess blood pressure and adjust antihypertensive treatment as needed Repeat CBC to follow hemoglobin trend/stability.  Discharge Diagnoses: Principal Problem:   Hyponatremia Active Problems:   Essential hypertension   Atrial fibrillation with RVR (HCC)   Acute heart failure with preserved ejection fraction (HFpEF) (HCC)   Generalized weakness  Brief Hospital admission narrative: 89 year old female with a history of hypertension and hyperlipidemia presenting with a fall on the morning of 03/24/2024.  The patient states that she has had some nausea and decreased oral intake for at least the past week.  She was feeling a bit dizzy.  She was getting out of bed and sat on the edge of the bed.  She felt like she fell off the edge of the bed onto her right side.  She denied loss of consciousness.  She did hit her head.  She was weak and had difficulty getting off the floor.  She called her daughter-in-law who called the patient's grandson to come help her. The patient complains of pain in her right hand which has been chronic.  She went to see orthopedic surgery in October.  There were plans for operation for carpal tunnel.  She has some right shoulder pain. She denied any fevers, chills, headache, neck pain, chest pain, shortness breath, abdominal pain, Vomiting, diarrhea.  She denies any dysuria or hematuria.  She has not had any hematochezia or melena.  She denies any new medications.  She is a non-smoker.  She does not drink alcohol .  She denies illicit drug use.   In the ED, the patient was afebrile and hemodynamically stable with oxygen saturation 95% room air.  She  was tachycardic in the 110s.  WBC 17.4, hemoglobin 9.9, platelets 406.  Sodium 110, potassium 4.3, bicarbonate 19, serum creatinine 0.62.  AST 36, ALT 33, alk phosphatase 99, total bilirubin 0.7.  Albumin is 3.3.  Magnesium  1.7.  EKG showed atrial fibrillation with nonspecific ST changes.  proBNP 2551   Chest x-ray was negative for any infiltrates or edema.  There was a deformity to right clavicle.  CT of the brain was negative for any acute findings.  There was right periorbital swelling of the soft tissues.  CT cervical spine was negative for any traumatic listhesis.  X-ray of the right knee was negative for fracture or dislocation.  The patient was given 2 L of fluid in the ED.  She was admitted for further evaluation and treatment of her hyponatremia.    Assessment and Plan: Hyponatremia -Clinically the patient appears to be hypervolemic/fluid overload. - Na 110>>122>>126>>127>>125>>123 - Patient is alert, awake and oriented x 3 and without any acute neurologic complaint. - Urine osmolarity 600 - Serum osmolarity 236 - Urine sodium <30 - Urine creatinine 104 - Appreciate assistance and recommendation by nephrology service - Patient discharged home with instruction to follow 1500 mL daily fluid restriction along with 30 mg of Demadex  daily. - Continue close monitoring of patient's electrolytes trend/renal function and volume status.   Fluid Overload/Acute HFpEF - 12/28 Echo--EF 65-70%, no WMA, normal RVF, mild TR - Following nephrology service recommendation will hold Lasix  today and provide treatment with tolvaptan . - Good urine output appreciated; patient's volume  status improving (still with signs of fluid overload on exam).   New onset atrial fibrillation with RVR - Patient states that she was told by her orthopedist about 2 months ago that she had atrial fibrillation - She has never followed up with her PCP - TSH 2.760 - 12/28 Echo--EF 65-70%, no WMA, normal RVF, mild TR -  CHADSVASC2 = 4 (Age x 2, HTN, female) - started on apixaban  - Patient has been discharged on oral extended release Cardizem  and twice a day of metoprolol . - Continue close monitoring of patient's electrolytes with intention to keep magnesium  above 2 and potassium above 4.   Essential hypertension - Overall stable and well-controlled - Patient taking Cardizem , metoprolol  and Demadex  at discharge. - Heart healthy/low-sodium diet discussed with patient.   Leukemoid reaction - Blood cultures x 2 sets--neg to date - Obtain UA--0-5 WBC - Personally reviewed chest x-ray--no infiltrates - Check PCT 0.32 - remains afebrile and hemodynamically stable -No antibiotics required. - Repeat CBC at follow-up visit to assess stability.   Pre-Diabetes -12/27 A1C--6.1 -Modified carbohydrate diet has been discussed with patient. -Continue patient follow-up with PCP.   LUE edema -venous duplex -Negative Doppler for DVT; patient with positive cephalic SVT/thrombophlebitis. - Continue treatment with Eliquis  (initiated for management of secondary prevention A-fib) - Continue management with diuretics and adequate hydration Sodium diet as discussed with patient.  Consultants: Nephrology service Procedures performed: See below for x-ray reports. Disposition: Home with home health services. Diet recommendation: Heart healthy diet.  DISCHARGE MEDICATION: Allergies as of 03/31/2024       Reactions   Penicillins Rash        Medication List     STOP taking these medications    carvedilol 3.125 MG tablet Commonly known as: COREG   Myrbetriq 25 MG Tb24 tablet Generic drug: mirabegron ER       TAKE these medications    apixaban  5 MG Tabs tablet Commonly known as: ELIQUIS  Take 1 tablet (5 mg total) by mouth 2 (two) times daily.   CALCIUM PO Take 1 tablet by mouth daily.   CRANBERRY PO Take 1 tablet by mouth daily.   diltiazem  120 MG 24 hr capsule Commonly known as: Cardizem  CD Take  1 capsule (120 mg total) by mouth daily.   metoprolol  tartrate 25 MG tablet Commonly known as: LOPRESSOR  Take 1 tablet (25 mg total) by mouth 2 (two) times daily.   torsemide  20 MG tablet Commonly known as: DEMADEX  Take 1.5 tablets (30 mg total) by mouth daily. Start taking on: April 01, 2024   triamcinolone cream 0.5 % Commonly known as: KENALOG Apply 1 application  topically 3 (three) times daily as needed.               Durable Medical Equipment  (From admission, onward)           Start     Ordered   03/29/24 1103  For home use only DME 3 n 1  Once        03/29/24 1102            Contact information for follow-up providers     Vick Lurie, FNP. Schedule an appointment as soon as possible for a visit in 10 day(s).   Specialty: Family Medicine Contact information: [address] [place] [address] [phone]              Contact information for after-discharge care     Durable Medical Equipment  [facility] (DME) .   Service: Durable Medical Equipment Contact information: [address] [place]  [phone]             Home Medical Care     [facility]) .   Service: Home Health Services Contact information: [address] [place]  [address] [phone]                    Discharge Exam: Glenda Nguyen   03/29/24 0513 03/30/24 0431 03/31/24 0456  Weight: 81.5 kg 80.7 kg 75 kg    General exam: Alert, awake, oriented x 3; in no acute distress. Respiratory system: Clear to auscultation. Respiratory effort normal.  Good saturation on room air. Cardiovascular system: Rate rate controlled, no rubs, no gallops, no JVD. Gastrointestinal system: Abdomen is nondistended, soft and nontender. No organomegaly or masses felt. Normal bowel sounds heard. Central nervous system: Alert and  oriented. No focal neurological deficits. Extremities: No cyanosis or clubbing; trace to 1+ edema appreciated bilaterally. Skin: No petechiae. Psychiatry: Judgement and insight appear normal. Mood & affect appropriate.   Condition at discharge: Stable and improved.  The results of significant diagnostics from this hospitalization (including imaging, microbiology, ancillary and laboratory) are listed below for reference.   Imaging Studies: US  Venous Img Upper Uni Left (DVT) Result Date: 03/27/2024 CLINICAL DATA:  Left upper extremity edema EXAM: LEFT UPPER EXTREMITY VENOUS DOPPLER ULTRASOUND TECHNIQUE: Gray-scale sonography with graded compression, as well as color Doppler and duplex ultrasound were performed to evaluate the upper extremity deep venous system from the level of the subclavian vein and including the jugular, axillary, basilic, radial, ulnar and upper cephalic vein. Spectral Doppler was utilized to evaluate flow at rest and with distal augmentation maneuvers. COMPARISON:  None Available. FINDINGS: Contralateral Subclavian Vein: Respiratory phasicity is normal and symmetric with the symptomatic side. No evidence of thrombus. Normal compressibility. Internal Jugular Vein: No evidence of thrombus. Normal compressibility, respiratory phasicity and response to augmentation. Subclavian Vein: No evidence of thrombus. Normal compressibility, respiratory phasicity and response to augmentation. Axillary Vein: No evidence of thrombus. Normal compressibility, respiratory phasicity and response to augmentation. Cephalic Vein: Left cephalic vein at the antecubital fossa demonstrates ill-defined hypoechoic intraluminal thrombus and is noncompressible compatible with superficial thrombosis/thrombophlebitis. Basilic Vein: No evidence of thrombus. Normal compressibility, respiratory phasicity and response to augmentation. Brachial Veins: No evidence of thrombus. Normal compressibility, respiratory phasicity  and response to augmentation. Radial Veins: No evidence of thrombus. Normal compressibility, respiratory phasicity and response to augmentation. Ulnar Veins: No evidence of thrombus. Normal compressibility, respiratory phasicity and response to augmentation. IMPRESSION: 1. Negative for left upper extremity DVT. 2. Positive for left cephalic vein superficial thrombosis/thrombophlebitis at the antecubital fossa. Electronically Signed   By: CHRISTELLA.  Shick M.D.   On: 03/27/2024 10:30   ECHOCARDIOGRAM COMPLETE Result Date: 03/25/2024    ECHOCARDIOGRAM REPORT   Patient Name:   Glenda Nguyen Date of Exam: 03/25/2024 Medical Rec #:  [id number]           Height:       67.0 in Accession #:    [id number]          Weight:       188.3 lb Date of Birth:  [birth year]           BSA:          1.971 m Patient Age:    77 years  BP:           126/57 mmHg Patient Gender: F                   HR:           102 bpm. Exam Location:  Inpatient Procedure: 2D Echo, Cardiac Doppler and Color Doppler (Both Spectral and Color            Flow Doppler were utilized during procedure). Indications:    Atrial fibrillation  History:        Patient has no prior history of Echocardiogram examinations.                 Arrythmias:Atrial Fibrillation; Risk Factors:Hypertension.  Sonographer:    Philomena Daring Referring Phys: CLARISSA.CLIMES DAVID TAT IMPRESSIONS  1. Left ventricular ejection fraction, by estimation, is 65 to 70%. The left ventricle has normal function. The left ventricle has no regional wall motion abnormalities. There is mild concentric left ventricular hypertrophy. Left ventricular diastolic parameters are indeterminate.  2. Right ventricular systolic function is normal. The right ventricular size is normal.  3. The mitral valve is normal in structure. No evidence of mitral valve regurgitation. No evidence of mitral stenosis.  4. The aortic valve was not well visualized. There is mild calcification of the aortic valve. Aortic valve  regurgitation is mild. Aortic valve sclerosis is present, with no evidence of aortic valve stenosis.  5. The inferior vena cava is normal in size with greater than 50% respiratory variability, suggesting right atrial pressure of 3 mmHg. Comparison(s): No prior Echocardiogram. FINDINGS  Left Ventricle: Left ventricular ejection fraction, by estimation, is 65 to 70%. The left ventricle has normal function. The left ventricle has no regional wall motion abnormalities. The left ventricular internal cavity size was normal in size. There is  mild concentric left ventricular hypertrophy. Left ventricular diastolic parameters are indeterminate. Right Ventricle: The right ventricular size is normal. No increase in right ventricular wall thickness. Right ventricular systolic function is normal. Left Atrium: Left atrial size was normal in size. Right Atrium: Right atrial size was normal in size. Pericardium: There is no evidence of pericardial effusion. Mitral Valve: The mitral valve is normal in structure. No evidence of mitral valve regurgitation. No evidence of mitral valve stenosis. Tricuspid Valve: The tricuspid valve is normal in structure. Tricuspid valve regurgitation is mild . No evidence of tricuspid stenosis. Aortic Valve: The aortic valve was not well visualized. There is mild calcification of the aortic valve. Aortic valve regurgitation is mild. Aortic regurgitation PHT measures 465 msec. Aortic valve sclerosis is present, with no evidence of aortic valve stenosis. Pulmonic Valve: The pulmonic valve was normal in structure. Pulmonic valve regurgitation is mild. No evidence of pulmonic stenosis. Aorta: The aortic root and ascending aorta are structurally normal, with no evidence of dilitation. Venous: The inferior vena cava is normal in size with greater than 50% respiratory variability, suggesting right atrial pressure of 3 mmHg. IAS/Shunts: No atrial level shunt detected by color flow Doppler.  LEFT VENTRICLE  PLAX 2D LVIDd:         3.90 cm   Diastology LVIDs:         2.60 cm   LV e' medial:    10.23 cm/s LV PW:         1.10 cm   LV E/e' medial:  9.5 LV IVS:        1.10 cm   LV e' lateral:   14.80 cm/s LVOT  diam:     1.80 cm   LV E/e' lateral: 6.5 LV SV:         42 LV SV Index:   22 LVOT Area:     2.54 cm  RIGHT VENTRICLE             IVC RV Basal diam:  2.90 cm     IVC diam: 1.40 cm RV Mid diam:    2.20 cm RV S prime:     14.00 cm/s TAPSE (M-mode): 2.2 cm LEFT ATRIUM             Index        RIGHT ATRIUM           Index LA diam:        3.30 cm 1.67 cm/m   RA Area:     15.70 cm LA Vol (A2C):   46.8 ml 23.74 ml/m  RA Volume:   37.00 ml  18.77 ml/m LA Vol (A4C):   42.7 ml 21.66 ml/m LA Biplane Vol: 48.7 ml 24.71 ml/m  AORTIC VALVE LVOT Vmax:   129.33 cm/s LVOT Vmean:  83.367 cm/s LVOT VTI:    0.167 m AI PHT:      465 msec  AORTA Ao Root diam: 3.20 cm Ao Asc diam:  3.20 cm MITRAL VALVE               TRICUSPID VALVE MV Area (PHT): 4.67 cm    TR Peak grad:   30.9 mmHg MV Decel Time: 163 msec    TR Vmax:        278.00 cm/s MV E velocity: 96.90 cm/s                            SHUNTS                            Systemic VTI:  0.17 m                            Systemic Diam: 1.80 cm Stanly Leavens MD Electronically signed by Stanly Leavens MD Signature Date/Time: 03/25/2024/12:13:57 PM    Final    DG Chest 1 View Result Date: 03/24/2024 CLINICAL DATA:  Weakness and fall. EXAM: CHEST  1 VIEW COMPARISON:  None available. FINDINGS: The heart is mildly enlarged. No pulmonary vascular congestion. Mediastinal silhouette within normal limits. Lungs are clear. Deformity of the lateral RIGHT clavicle suspicious for fracture. IMPRESSION: 1. Mild cardiomegaly. 2. Deformity of the lateral RIGHT clavicle suspicious for fracture. Please correlate for focal tenderness. Electronically Signed   By: Aliene Lloyd M.D.   On: 03/24/2024 14:04   DG Knee Complete 4 Views Right Result Date: 03/24/2024 EXAM: 4 VIEW(S) XRAY OF THE  KNEE 03/24/2024 11:07:00 AM COMPARISON: None available. CLINICAL HISTORY: fall FINDINGS: BONES AND JOINTS: No acute fracture. No malalignment. No significant joint effusion. Mild lateral compartment joint space narrowing. Marginal osteophytosis and subchondral sclerosis of the lateral compartment. Additional subtle marginal osteophytosis of the medial compartment. SOFT TISSUES: Mild diffuse soft tissue swelling. IMPRESSION: 1. No acute fracture or dislocation. 2. Mild diffuse soft tissue swelling. 3. Lateral compartment predominant osteoarthritis. Electronically signed by: Donnice Mania MD 03/24/2024 11:27 AM EST RP Workstation: [id number]   CT Cervical Spine Wo Contrast Result Date: 03/24/2024 EXAM: CT CERVICAL SPINE WITHOUT CONTRAST 03/24/2024 10:59:54 AM TECHNIQUE: CT of the  cervical spine was performed without the administration of intravenous contrast. Multiplanar reformatted images are provided for review. Automated exposure control, iterative reconstruction, and/or weight based adjustment of the mA/kV was utilized to reduce the radiation dose to as low as reasonably achievable. COMPARISON: None available. CLINICAL HISTORY: Neck trauma (Age >= 65y) FINDINGS: BONES AND ALIGNMENT: Straightening of the normal cervical lordosis. Trace degenerative anterolisthesis of C2 on C3 and C3 on C4. Additional trace degenerative anterolisthesis of C7 on T1. No acute fracture or traumatic malalignment. DEGENERATIVE CHANGES: Moderate disc space narrowing at C4-C5. Additional disc space narrowing at C5-C6 and C6-C7. Mild degenerative endplate osteophytes at multiple levels. There are disc osteophyte complexes at multiple levels without evidence of high grade osseous spinal canal stenosis. Facet arthrosis and uncovertebral hypertrophy at multiple levels. SOFT TISSUES: No prevertebral soft tissue swelling. LUNGS: Scarring and atelectasis in the right lung apex. IMPRESSION: 1. No evidence of acute traumatic injury. 2.  Degenerative changes as above. Electronically signed by: Donnice Mania MD 03/24/2024 11:25 AM EST RP Workstation: [id number]   CT Head Wo Contrast Result Date: 03/24/2024 EXAM: CT HEAD WITHOUT CONTRAST 03/24/2024 10:59:54 AM TECHNIQUE: CT of the head was performed without the administration of intravenous contrast. Automated exposure control, iterative reconstruction, and/or weight based adjustment of the mA/kV was utilized to reduce the radiation dose to as low as reasonably achievable. COMPARISON: None available. CLINICAL HISTORY: Head trauma, minor (Age >= 65y). FINDINGS: BRAIN AND VENTRICLES: No acute hemorrhage. 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 Mild chronic microvascular ischemic change. No evidence of acute infarct. No hydrocephalus. No extra-axial collection. No mass effect or midline shift. ORBITS: Bilateral lens replacement noted. SINUSES: Trace right mastoid effusion. SOFT TISSUES AND SKULL: Right periorbital soft tissue swelling. No skull fracture. IMPRESSION: 1. No acute intracranial abnormality. 2. Right periorbital soft tissue swelling. 3. Focal encephalomalacia along the anterolateral left temporal lobe, suggestive of small remote infarct or sequelae of prior trauma. Electronically signed by: Donnice Mania MD 03/24/2024 11:18 AM EST RP Workstation: [id number]    Microbiology: Results for orders placed or performed during the hospital encounter of 03/24/24  Resp panel by RT-PCR (RSV, Flu A&B, Covid) Anterior Nasal Swab     Status: None   Collection Time: 03/24/24 12:32 PM   Specimen: Anterior Nasal Swab  Result Value Ref Range Status   SARS Coronavirus 2 by RT PCR NEGATIVE NEGATIVE Final    Comment: (NOTE) SARS-CoV-2 target nucleic acids are NOT DETECTED.  The SARS-CoV-2 RNA is generally detectable in upper respiratory specimens during the acute phase of infection. The lowest concentration of  SARS-CoV-2 viral copies this assay can detect is 138 copies/mL. A negative result does not preclude SARS-Cov-2 infection and should not be used as the sole basis for treatment or other patient management decisions. A negative result may occur with  improper specimen collection/handling, submission of specimen other than nasopharyngeal swab, presence of viral mutation(s) within the areas targeted by this assay, and inadequate number of viral copies(<138 copies/mL). A negative result must be combined with clinical observations, patient history, and epidemiological information. The expected result is Negative.  Fact Sheet for Patients:  bloggercourse.com  Fact Sheet for Healthcare Providers:  seriousbroker.it  This test is no t yet approved or cleared by the [place]  FDA and  has been authorized for detection and/or diagnosis of SARS-CoV-2 by FDA under an Emergency Use Authorization (EUA). This EUA will remain  in effect (meaning this test  can be used) for the duration of the COVID-19 declaration under Section 564(b)(1) of the Act, 21 U.S.C.section 360bbb-3(b)(1), unless the authorization is terminated  or revoked sooner.       Influenza A by PCR NEGATIVE NEGATIVE Final   Influenza B by PCR NEGATIVE NEGATIVE Final    Comment: (NOTE) The Xpert Xpress SARS-CoV-2/FLU/RSV plus assay is intended as an aid in the diagnosis of influenza from Nasopharyngeal swab specimens and should not be used as a sole basis for treatment. Nasal washings and aspirates are unacceptable for Xpert Xpress SARS-CoV-2/FLU/RSV testing.  Fact Sheet for Patients: bloggercourse.com  Fact Sheet for Healthcare Providers: seriousbroker.it  This test is not yet approved or cleared by the [place]  FDA and has been authorized for detection and/or diagnosis of SARS-CoV-2 by FDA under an Emergency Use  Authorization (EUA). This EUA will remain in effect (meaning this test can be used) for the duration of the COVID-19 declaration under Section 564(b)(1) of the Act, 21 U.S.C. section 360bbb-3(b)(1), unless the authorization is terminated or revoked.     Resp Syncytial Virus by PCR NEGATIVE NEGATIVE Final    Comment: (NOTE) Fact Sheet for Patients: bloggercourse.com  Fact Sheet for Healthcare Providers: seriousbroker.it  This test is not yet approved or cleared by the [place]  FDA and has been authorized for detection and/or diagnosis of SARS-CoV-2 by FDA under an Emergency Use Authorization (EUA). This EUA will remain in effect (meaning this test can be used) for the duration of the COVID-19 declaration under Section 564(b)(1) of the Act, 21 U.S.C. section 360bbb-3(b)(1), unless the authorization is terminated or revoked.  Performed at [facility], [address] [place], [place] [address]   MRSA Next Gen by PCR, Nasal     Status: None   Collection Time: 03/24/24  3:18 PM   Specimen: Nasal Mucosa; Nasal Swab  Result Value Ref Range Status   MRSA by PCR Next Gen NOT DETECTED NOT DETECTED Final    Comment: (NOTE) The GeneXpert MRSA Assay (FDA approved for NASAL specimens only), is one component of a comprehensive MRSA colonization surveillance program. It is not intended to diagnose MRSA infection nor to guide or monitor treatment for MRSA infections. Test performance is not FDA approved in patients less than 14 years old. Performed at [facility], [address] [place], [place] [address]   Culture, blood (Routine X 2) w Reflex to ID Panel     Status: None   Collection Time: 03/24/24  5:23 PM   Specimen: BLOOD  Result Value Ref Range Status   Specimen Description BLOOD BLOOD RIGHT WRIST  Final   Special Requests   Final    BOTTLES DRAWN AEROBIC AND ANAEROBIC Blood Culture adequate volume   Culture   Final    NO  GROWTH 5 DAYS Performed at [facility], [address] [place], [place] [address]    Report Status 03/29/2024 FINAL  Final  Culture, blood (Routine X 2) w Reflex to ID Panel     Status: None   Collection Time: 03/24/24  5:23 PM   Specimen: BLOOD RIGHT HAND  Result Value Ref Range Status   Specimen Description BLOOD RIGHT HAND BLOOD  Final   Special Requests   Final    BOTTLES DRAWN AEROBIC AND ANAEROBIC Blood Culture adequate volume   Culture   Final    NO GROWTH 5 DAYS Performed at [facility], [address] [place], [place] [address]    Report Status 03/29/2024 FINAL  Final  Labs: CBC: Recent Labs  Lab 03/25/24 0552  WBC 14.5*  HGB 10.0*  HCT 27.3*  MCV 86.1  PLT 413*   Basic Metabolic Panel: Recent Labs  Lab 03/25/24 0552 03/25/24 1018 03/26/24 1459 03/26/24 1908 03/27/24 0303 03/27/24 0718 03/28/24 0832 03/29/24 0404 03/29/24 1805 03/30/24 0131 03/30/24 0748 03/30/24 1537 03/31/24 0402  NA 113*   < > 123*   < > 126*   < > 125* 123* 130* 129* 132* 130* 129*  K  --    < > 3.7  --  4.4  --  4.0 4.2  --  4.6  --   --  4.4  CL  --    < > 89*  --  93*  --  89* 87*  --  94*  --   --  93*  CO2  --    < > 22  --  29  --  23 31  --  31  --   --  33*  GLUCOSE  --    < > 111*  --  90  --  114* 86  --  96  --   --  85  BUN  --    < > 11  --  13  --  13 14  --  14  --   --  15  CREATININE  --    < > 0.67  --  0.62  --  0.67 0.63  --  0.61  --   --  0.71  CALCIUM  --    < > 8.0*  --  7.9*  --  8.6* 8.0*  --  7.2*  --   --  8.3*  MG 2.0  --  1.9  --  2.4  --   --  2.0  --   --   --   --   --    < > = values in this interval not displayed.   Discharge time spent:  35 minutes.  Signed: Eric Nunnery, MD [facility] 03/31/2024 "

## 2024-03-31 NOTE — TOC Transition Note (Signed)
 Transition of Care [facility]) - Discharge Note   Patient Details  Name: Glenda Nguyen MRN: [id number] Date of Birth: Mar 20, 1935  Transition of Care [facility]) CM/SW Contact:  Ronnald MARLA Sil, RN Phone Number: 03/31/2024, 4:07 PM   Clinical Narrative:    Primary RN - Qiana notified CM the [facility] was not available in patient's room upon her D/C.  CM contacted [employer] liaison - Kiersten and confirmed the [facility] was delivered yesterday on 1/2, at 11:44am and patient signed for it.  CM contacted DIL - [place] and she confirmed the family did not pick up the [facility] from the room.  CM msg'd the Chg RN - Brandie and [facility] - Tim to notify of missing [facility] and request assist in locating the device on the floor.     Final next level of care: Home w Home Health Services Barriers to Discharge: No Barriers Identified

## 2024-03-31 NOTE — Plan of Care (Signed)
   Problem: Education: Goal: Knowledge of General Education information will improve Description: Including pain rating scale, medication(s)/side effects and non-pharmacologic comfort measures Outcome: Progressing   Problem: Activity: Goal: Risk for activity intolerance will decrease Outcome: Progressing   Problem: Nutrition: Goal: Adequate nutrition will be maintained Outcome: Progressing

## 2024-03-31 NOTE — TOC Transition Note (Signed)
 Transition of Care [facility]) - Discharge Note   Patient Details  Name: Glenda Nguyen MRN: [id number] Date of Birth: 01-12-1935  Transition of Care [facility]) CM/SW Contact:  Ronnald MARLA Sil, RN Phone Number: 03/31/2024, 2:44 PM   Clinical Narrative:    Patient discharging today, CM sent text to [facility] liaison - Delon and forwarded D/C Summary / Orders to agency via Epic HUB.   No additional needs identified at this time.  Final next level of care: Home w Home Health Services Barriers to Discharge: No Barriers Identified   Patient Goals and CMS Choice Patient states their goals for this hospitalization and ongoing recovery are:: return home with [facility] CMS Medicare.gov Compare Post Acute Care list provided to:: Patient Choice offered to / list presented to : Patient      Discharge Placement                       Discharge Plan and Services Additional resources added to the After Visit Summary for   In-house Referral: Clinical Social Work   Post Acute Care Choice: Home Health, Durable Medical Equipment          DME Arranged: Bedside commode DME Agency: [employer] Date DME Agency Contacted: 03/29/24 Time DME Agency Contacted: 1124 Representative spoke with at DME Agency: Mitch HH Arranged: PT, OT HH Agency: [facility] Date [facility] Agency Contacted: 03/31/24 Time HH Agency Contacted: [id number] Representative spoke with at [facility] Agency: Delon via Text  Social Drivers of Health (SDOH) Interventions SDOH Screenings   Food Insecurity: No Food Insecurity (03/24/2024)  Housing: Low Risk (03/24/2024)  Transportation Needs: No Transportation Needs (03/24/2024)  Utilities: Not At Risk (03/24/2024)  Social Connections: Unknown (03/24/2024)  Tobacco Use: Low Risk (03/24/2024)     Readmission Risk Interventions    03/29/2024   11:22 AM  Readmission Risk Prevention Plan  Post Dischage Appt Complete  Medication Screening Complete  Transportation Screening  Complete

## 2024-04-03 ENCOUNTER — Emergency Department (HOSPITAL_COMMUNITY)

## 2024-04-03 ENCOUNTER — Encounter (HOSPITAL_COMMUNITY): Payer: Self-pay | Admitting: *Deleted

## 2024-04-03 ENCOUNTER — Other Ambulatory Visit: Payer: Self-pay

## 2024-04-03 ENCOUNTER — Inpatient Hospital Stay (HOSPITAL_COMMUNITY)
Admission: EM | Admit: 2024-04-03 | Discharge: 2024-04-14 | DRG: 291 | Disposition: A | Discharge status: Home / Self Care | Attending: Internal Medicine | Admitting: Internal Medicine

## 2024-04-03 DIAGNOSIS — L03116 Cellulitis of left lower limb: Secondary | ICD-10-CM | POA: Diagnosis present

## 2024-04-03 DIAGNOSIS — Z880 Allergy status to penicillin: Secondary | ICD-10-CM | POA: Diagnosis not present

## 2024-04-03 DIAGNOSIS — R000 Tachycardia, unspecified: Secondary | ICD-10-CM | POA: Diagnosis present

## 2024-04-03 DIAGNOSIS — I509 Heart failure, unspecified: Principal | ICD-10-CM

## 2024-04-03 DIAGNOSIS — E876 Hypokalemia: Secondary | ICD-10-CM | POA: Diagnosis present

## 2024-04-03 DIAGNOSIS — J209 Acute bronchitis, unspecified: Secondary | ICD-10-CM | POA: Diagnosis present

## 2024-04-03 DIAGNOSIS — W06XXXA Fall from bed, initial encounter: Secondary | ICD-10-CM | POA: Diagnosis present

## 2024-04-03 DIAGNOSIS — R7303 Prediabetes: Secondary | ICD-10-CM | POA: Diagnosis present

## 2024-04-03 DIAGNOSIS — I5033 Acute on chronic diastolic (congestive) heart failure: Secondary | ICD-10-CM | POA: Diagnosis present

## 2024-04-03 DIAGNOSIS — E222 Syndrome of inappropriate secretion of antidiuretic hormone: Secondary | ICD-10-CM | POA: Diagnosis present

## 2024-04-03 DIAGNOSIS — J111 Influenza due to unidentified influenza virus with other respiratory manifestations: Secondary | ICD-10-CM | POA: Diagnosis not present

## 2024-04-03 DIAGNOSIS — M7989 Other specified soft tissue disorders: Secondary | ICD-10-CM | POA: Diagnosis present

## 2024-04-03 DIAGNOSIS — E872 Acidosis, unspecified: Secondary | ICD-10-CM | POA: Diagnosis present

## 2024-04-03 DIAGNOSIS — E871 Hypo-osmolality and hyponatremia: Secondary | ICD-10-CM | POA: Diagnosis present

## 2024-04-03 DIAGNOSIS — R55 Syncope and collapse: Secondary | ICD-10-CM | POA: Diagnosis not present

## 2024-04-03 DIAGNOSIS — I4891 Unspecified atrial fibrillation: Secondary | ICD-10-CM | POA: Diagnosis not present

## 2024-04-03 DIAGNOSIS — R296 Repeated falls: Secondary | ICD-10-CM | POA: Diagnosis present

## 2024-04-03 DIAGNOSIS — I4819 Other persistent atrial fibrillation: Secondary | ICD-10-CM | POA: Diagnosis present

## 2024-04-03 DIAGNOSIS — I2489 Other forms of acute ischemic heart disease: Secondary | ICD-10-CM | POA: Diagnosis present

## 2024-04-03 DIAGNOSIS — Z7901 Long term (current) use of anticoagulants: Secondary | ICD-10-CM

## 2024-04-03 DIAGNOSIS — S0003XA Contusion of scalp, initial encounter: Secondary | ICD-10-CM | POA: Diagnosis present

## 2024-04-03 DIAGNOSIS — Z1152 Encounter for screening for COVID-19: Secondary | ICD-10-CM

## 2024-04-03 DIAGNOSIS — I110 Hypertensive heart disease with heart failure: Principal | ICD-10-CM | POA: Diagnosis present

## 2024-04-03 DIAGNOSIS — J101 Influenza due to other identified influenza virus with other respiratory manifestations: Secondary | ICD-10-CM | POA: Diagnosis present

## 2024-04-03 DIAGNOSIS — J09X2 Influenza due to identified novel influenza A virus with other respiratory manifestations: Secondary | ICD-10-CM

## 2024-04-03 DIAGNOSIS — Z79899 Other long term (current) drug therapy: Secondary | ICD-10-CM

## 2024-04-03 DIAGNOSIS — E7800 Pure hypercholesterolemia, unspecified: Secondary | ICD-10-CM | POA: Diagnosis present

## 2024-04-03 LAB — CBC WITH DIFFERENTIAL/PLATELET
Abs Immature Granulocytes: 0.03 K/uL (ref 0.00–0.07)
Basophils Absolute: 0 K/uL (ref 0.0–0.1)
Basophils Relative: 0 %
Eosinophils Absolute: 0 K/uL (ref 0.0–0.5)
Eosinophils Relative: 1 %
HCT: 29.2 % — ABNORMAL LOW (ref 36.0–46.0)
Hemoglobin: 10 g/dL — ABNORMAL LOW (ref 12.0–15.0)
Immature Granulocytes: 1 %
Lymphocytes Relative: 9 %
Lymphs Abs: 0.5 K/uL — ABNORMAL LOW (ref 0.7–4.0)
MCH: 30.9 pg (ref 26.0–34.0)
MCHC: 34.2 g/dL (ref 30.0–36.0)
MCV: 90.1 fL (ref 80.0–100.0)
Monocytes Absolute: 0.4 K/uL (ref 0.1–1.0)
Monocytes Relative: 6 %
Neutro Abs: 4.9 K/uL (ref 1.7–7.7)
Neutrophils Relative %: 83 %
Platelets: 368 K/uL (ref 150–400)
RBC: 3.24 MIL/uL — ABNORMAL LOW (ref 3.87–5.11)
RDW: 14.9 % (ref 11.5–15.5)
WBC: 5.8 K/uL (ref 4.0–10.5)
nRBC: 0 % (ref 0.0–0.2)

## 2024-04-03 LAB — BASIC METABOLIC PANEL WITH GFR
Anion gap: 17 — ABNORMAL HIGH (ref 5–15)
BUN: 19 mg/dL (ref 8–23)
CO2: 20 mmol/L — ABNORMAL LOW (ref 22–32)
Calcium: 8.4 mg/dL — ABNORMAL LOW (ref 8.9–10.3)
Chloride: 90 mmol/L — ABNORMAL LOW (ref 98–111)
Creatinine, Ser: 0.7 mg/dL (ref 0.44–1.00)
GFR, Estimated: >60 mL/min
Glucose, Bld: 99 mg/dL (ref 70–99)
Potassium: 3.6 mmol/L (ref 3.5–5.1)
Sodium: 127 mmol/L — ABNORMAL LOW (ref 135–145)

## 2024-04-03 LAB — TROPONIN T, HIGH SENSITIVITY
Troponin T High Sensitivity: 53 ng/L — ABNORMAL HIGH (ref 0–19)
Troponin T High Sensitivity: 42 ng/L — ABNORMAL HIGH (ref 0–19)

## 2024-04-03 LAB — D-DIMER, QUANTITATIVE: D-Dimer, Quant: 2.78 ug{FEU}/mL — ABNORMAL HIGH (ref 0.00–0.50)

## 2024-04-03 LAB — PRO BRAIN NATRIURETIC PEPTIDE: Pro Brain Natriuretic Peptide: 6228 pg/mL — ABNORMAL HIGH

## 2024-04-03 MED ORDER — GUAIFENESIN-DM 100-10 MG/5ML PO SYRP
5.0000 mL | ORAL_SOLUTION | ORAL | Status: DC | PRN
  Administered 2024-04-03 – 2024-04-12 (×13): 5 mL via ORAL
  Filled 2024-04-03 (×13): qty 5

## 2024-04-03 MED ORDER — APIXABAN 5 MG PO TABS
5.0000 mg | ORAL_TABLET | Freq: Two times a day (BID) | ORAL | Status: DC
  Administered 2024-04-04 – 2024-04-09 (×11): 5 mg via ORAL
  Filled 2024-04-03 (×11): qty 1

## 2024-04-03 MED ORDER — METOPROLOL TARTRATE 25 MG PO TABS
25.0000 mg | ORAL_TABLET | Freq: Once | ORAL | Status: AC
  Administered 2024-04-03: 25 mg via ORAL
  Filled 2024-04-03: qty 1

## 2024-04-03 MED ORDER — IPRATROPIUM-ALBUTEROL 0.5-2.5 (3) MG/3ML IN SOLN
3.0000 mL | Freq: Once | RESPIRATORY_TRACT | Status: AC
  Administered 2024-04-03: 3 mL via RESPIRATORY_TRACT
  Filled 2024-04-03: qty 3

## 2024-04-03 MED ORDER — FUROSEMIDE 10 MG/ML IJ SOLN
20.0000 mg | Freq: Two times a day (BID) | INTRAMUSCULAR | Status: AC
  Administered 2024-04-04 (×2): 20 mg via INTRAVENOUS
  Filled 2024-04-03 (×2): qty 2

## 2024-04-03 MED ORDER — POLYETHYLENE GLYCOL 3350 17 G PO PACK: 17.0000 g | PACK | Freq: Every day | ORAL | Status: DC | PRN

## 2024-04-03 MED ORDER — FUROSEMIDE 10 MG/ML IJ SOLN
40.0000 mg | Freq: Once | INTRAMUSCULAR | Status: AC
  Administered 2024-04-03: 40 mg via INTRAVENOUS
  Filled 2024-04-03: qty 4

## 2024-04-03 MED ORDER — PROCHLORPERAZINE EDISYLATE 10 MG/2ML IJ SOLN
5.0000 mg | Freq: Four times a day (QID) | INTRAMUSCULAR | Status: DC | PRN
  Administered 2024-04-13: 5 mg via INTRAVENOUS
  Filled 2024-04-03: qty 2

## 2024-04-03 MED ORDER — CLINDAMYCIN PHOSPHATE 600 MG/50ML IV SOLN
600.0000 mg | Freq: Once | INTRAVENOUS | Status: AC
  Administered 2024-04-03: 600 mg via INTRAVENOUS
  Filled 2024-04-03: qty 50

## 2024-04-03 MED ORDER — IOHEXOL 350 MG/ML SOLN
75.0000 mL | Freq: Once | INTRAVENOUS | Status: AC | PRN
  Administered 2024-04-03: 75 mL via INTRAVENOUS

## 2024-04-03 MED ORDER — MELATONIN 3 MG PO TABS
6.0000 mg | ORAL_TABLET | Freq: Every evening | ORAL | Status: DC | PRN
  Administered 2024-04-05 – 2024-04-09 (×2): 6 mg via ORAL
  Filled 2024-04-03 (×3): qty 2

## 2024-04-03 MED ORDER — INFLUENZA VAC SPLIT HIGH-DOSE 0.5 ML IM SUSY: 0.5000 mL | PREFILLED_SYRINGE | INTRAMUSCULAR | Status: DC

## 2024-04-03 MED ORDER — ACETAMINOPHEN 500 MG PO TABS
500.0000 mg | ORAL_TABLET | Freq: Four times a day (QID) | ORAL | Status: AC | PRN
  Administered 2024-04-05 – 2024-04-09 (×4): 500 mg via ORAL
  Filled 2024-04-03 (×4): qty 1

## 2024-04-03 NOTE — ED Provider Notes (Signed)
 " [facility]GENC[facility] DE[facility] AT [facility] Provider Note   CSN: [id number] Arrival date & time: 04/03/24  1207     Patient presents with: Leg Swelling   Glenda Nguyen is a 89 y.o. female with a history including hypertension, CHF, atrial fibrillation presenting for evaluation of increasing swelling of her bilateral legs and feet left greater than right along with significant pain in her extremity making it difficult to ambulate. she also reports increased cough which has been nonproductive since DC from here 3 days ago where she was treated for hyponatremia and fluid overload along with new onset a fib.  Of note she has just started her new medications today, taking her first dose of her torsemide  and eliquis  prior to arrival.  She had frequent urination since arriving here after taking this medication.  She denies fevers or chills, she has had a nonproductive cough since her discharge home, she also endorses left-sided chest pain described as sharp and worsened when she coughs.  She reports generalized weakness and fatigue.   The history is provided by the patient.       Prior to Admission medications  Medication Sig Start Date End Date Taking? Authorizing Provider  apixaban  (ELIQUIS ) 5 MG TABS tablet Take 1 tablet (5 mg total) by mouth 2 (two) times daily. 03/31/24   Ricky Fines, MD  CALCIUM PO Take 1 tablet by mouth daily.    [provider]  CRANBERRY PO Take 1 tablet by mouth daily.    [provider]  diltiazem  (CARDIZEM  CD) 120 MG 24 hr capsule Take 1 capsule (120 mg total) by mouth daily. 03/31/24   Ricky Fines, MD  metoprolol  tartrate (LOPRESSOR ) 25 MG tablet Take 1 tablet (25 mg total) by mouth 2 (two) times daily. 03/31/24   Ricky Fines, MD  torsemide  (DEMADEX ) 20 MG tablet Take 1.5 tablets (30 mg total) by mouth daily. 04/01/24   Ricky Fines, MD  triamcinolone cream (KENALOG) 0.5 % Apply 1 application  topically 3 (three) times daily as  needed.    [provider]    Allergies: Penicillins    Review of Systems  Constitutional:  Negative for chills and fever.  HENT:  Negative for congestion and sore throat.   Eyes: Negative.   Respiratory:  Positive for cough and shortness of breath. Negative for chest tightness.   Cardiovascular:  Positive for chest pain and leg swelling.  Gastrointestinal:  Negative for abdominal pain and nausea.  Genitourinary:  Positive for frequency. Negative for dysuria and urgency.  Musculoskeletal:  Negative for arthralgias, joint swelling and neck pain.  Skin:  Positive for color change. Negative for rash and wound.  Neurological:  Negative for dizziness, weakness, light-headedness, numbness and headaches.  Psychiatric/Behavioral: Negative.      Updated Vital Signs BP 131/82   Pulse (!) 115   Temp 98.6 F (37 C) (Oral)   Resp 17   Ht 5' 7 (1.702 m)   SpO2 97%   BMI 25.90 kg/m   Physical Exam Vitals and nursing note reviewed.  Constitutional:      Appearance: She is well-developed.  HENT:     Head: Normocephalic and atraumatic.  Eyes:     Conjunctiva/sclera: Conjunctivae normal.  Cardiovascular:     Rate and Rhythm: Normal rate and regular rhythm.     Heart sounds: Normal heart sounds.  Pulmonary:     Effort: Pulmonary effort is normal.     Breath sounds: Wheezing present. No rales.  Abdominal:     General: Bowel sounds are normal.     Palpations: Abdomen is soft.     Tenderness: There is no abdominal tenderness.  Musculoskeletal:        General: Normal range of motion.     Cervical back: Normal range of motion.     Right lower leg: Edema present.     Left lower leg: Edema present.     Comments: Left greater than right lower extremity edema, there is increased warmth and erythema involving the left foot through her ankle.  There is also a localized deep purple circular abrasion like lesion to her dorsal foot near  the base of her 3rd and 4th toes.  There is a  similar but less obvious purple lesion right dorsal foot at the same location.  Skin appears intact at the site.  Skin:    General: Skin is warm and dry.  Neurological:     Mental Status: She is alert.     (all labs ordered are listed, but only abnormal results are displayed) Labs Reviewed  CBC WITH DIFFERENTIAL/PLATELET - Abnormal; Notable for the following components:      Result Value   RBC 3.24 (*)    Hemoglobin 10.0 (*)    HCT 29.2 (*)    Lymphs Abs 0.5 (*)    All other components within normal limits  BASIC METABOLIC PANEL WITH GFR - Abnormal; Notable for the following components:   Sodium 127 (*)    Chloride 90 (*)    CO2 20 (*)    Calcium 8.4 (*)    Anion gap 17 (*)    All other components within normal limits  PRO BRAIN NATRIURETIC PEPTIDE - Abnormal; Notable for the following components:   Pro Brain Natriuretic Peptide 6,228.0 (*)    All other components within normal limits  D-DIMER, QUANTITATIVE - Abnormal; Notable for the following components:   D-Dimer, Quant 2.78 (*)    All other components within normal limits  TROPONIN T, HIGH SENSITIVITY - Abnormal; Notable for the following components:   Troponin T High Sensitivity 53 (*)    All other components within normal limits  TROPONIN T, HIGH SENSITIVITY - Abnormal; Notable for the following components:   Troponin T High Sensitivity 42 (*)    All other components within normal limits    EKG: EKG Interpretation Date/Time:  Tuesday April 03 2024 12:21:15 EST Ventricular Rate:  94 PR Interval:    QRS Duration:  84 QT Interval:  346 QTC Calculation: 432 R Axis:   90  Text Interpretation: Atrial fibrillation Rightward axis  Compared with prior EKG from 03/24/2024 Confirmed by Gennaro Bouchard ([id number]) on 04/03/2024 7:37:35 PM  Radiology: CT Angio Chest PE W and/or Wo Contrast Result Date: 04/03/2024 EXAM: CTA CHEST 04/03/2024 06:44:08 PM TECHNIQUE: CTA of the chest was performed without and with the  administration of 75 mL of iohexol  (OMNIPAQUE ) 350 MG/ML injection. Multiplanar reformatted images are provided for review. MIP images are provided for review. Automated exposure control, iterative reconstruction, and/or weight based adjustment of the mA/kV was utilized to reduce the radiation dose to as low as reasonably achievable. COMPARISON: None available. CLINICAL HISTORY: Pulmonary embolism (PE) suspected, high prob. FINDINGS: PULMONARY ARTERIES: Pulmonary arteries are adequately opacified for evaluation. No acute pulmonary embolus. Main pulmonary artery is normal in caliber. MEDIASTINUM: The heart and pericardium demonstrate no acute abnormality. There are atherosclerotic calcifications of the aorta. LYMPH NODES: Large right hilar lymph node measuring 14 mm. No  mediastinal or axillary lymphadenopathy. LUNGS AND PLEURA: There is a trace of bilateral pleural effusions, left greater than right. No pneumothorax. There is atelectasis in the lingula. There is some scarring in both lung apices. There are calcified granulomas in the right upper lobe. The lungs are otherwise clear. UPPER ABDOMEN: Limited images of the upper abdomen are unremarkable. SOFT TISSUES AND BONES: No acute bone or soft tissue abnormality. IMPRESSION: 1. No pulmonary embolism. 2. Large right hilar lymph node measuring 14 mm. 3. Trace bilateral pleural effusions, left greater than right. Electronically signed by: Greig Pique MD 04/03/2024 07:28 PM EST RP Workstation: [id number]   DG Foot Complete Left Result Date: 04/03/2024 CLINICAL DATA:  Bruising and edema of left foot. EXAM: LEFT FOOT - COMPLETE 3+ VIEW COMPARISON:  None Available. FINDINGS: There is no evidence of acute fracture or dislocation. There is no evidence of arthropathy or other focal bone abnormality. Soft tissues are unremarkable. IMPRESSION: Negative. Electronically Signed   By: Marcey Moan M.D.   On: 04/03/2024 16:45   DG Chest 2 View Result Date:  04/03/2024 CLINICAL DATA:  Lower extremity swelling. EXAM: CHEST - 2 VIEW COMPARISON:  March 24, 2024 FINDINGS: The cardiac silhouette is mildly enlarged and unchanged in size. Mild, chronic appearing increased interstitial lung markings are seen without evidence of focal consolidation or pneumothorax. A small left pleural effusion is suspected. A deformity of indeterminate age is seen along the distal right clavicle. Multilevel degenerative changes are present throughout the thoracic spine. IMPRESSION: 1. Chronic appearing increased interstitial lung markings without evidence of acute or active cardiopulmonary disease. 2. Small left pleural effusion. 3. Deformity of indeterminate age along the distal right clavicle. Correlation with physical examination is recommended to determine the presence of point tenderness. Electronically Signed   By: Suzen Dials M.D.   On: 04/03/2024 13:11     Procedures   Medications Ordered in the ED  furosemide  (LASIX ) injection 40 mg (has no administration in time range)  ipratropium-albuterol  (DUONEB) 0.5-2.5 (3) MG/3ML nebulizer solution 3 mL (3 mLs Nebulization Given 04/03/24 1628)  clindamycin  (CLEOCIN ) IVPB 600 mg (0 mg Intravenous Stopped 04/03/24 1703)  iohexol  (OMNIPAQUE ) 350 MG/ML injection 75 mL (75 mLs Intravenous Contrast Given 04/03/24 1842)  metoprolol  tartrate (LOPRESSOR ) tablet 25 mg (25 mg Oral Given 04/03/24 1916)                                    Medical Decision Making Patient presenting with multiple complaints, peripheral edema which has been worse since she was discharged from here 3 days ago, also with redness and tenderness to her left foot, denies any trauma to this foot.  She does endorse shortness of breath, orthopnea.  Has a history of CHF, also has new A-fib diagnosed during this recent hospitalization.  She just had her medicines filled today which include her metoprolol , her Eliquis  and her Demadex , she took 30 mg of Demadex  prior to  arriving here.  Her history and exam is concerning for worsening CHF but also localizing redness and increased warmth of the left foot is concerning for a cellulitis as well.  She has been consistently tachycardic here, 0100-0115 range, she was given her evening dose of her metoprolol , had taken her diltiazem  prior to arrival.  D-dimer was obtained to rule out PE, this was elevated but CT angio chest was negative for acute PE.  She was given IV clindamycin  for foot  cellulitis given her penicillin allergy .  She was also given Lasix  40 mg IV, also wheezing on exam, DuoNeb was given and wheezing is improved.  She will need admission for treatment of CHF and cellulitis of the left lower extremity.  Amount and/or Complexity of Data Reviewed Labs: ordered.    Details: Labs significant for sodium of 127, she has a CO2 of 20 and an anion gap of 17, she has a normal glucose at 99, her proBNP is significantly elevated at 6228, hemoglobin is 10.0 which is stable, normal WBC count at 5.8.  D-dimer elevated at 2.78.  Her delta troponin is declining from 53-42. Radiology: ordered.    Details: Chest x-ray reviewed, interstitial lung markings, small pleural effusion, left foot imaging negative for fracture.  CT angio chest negative for PE. Discussion of management or test interpretation with external provider(s): Pt discussed with Dr. Shona who accepts pt for admission.  Risk Decision regarding hospitalization.        Final diagnoses:  Acute on chronic congestive heart failure, unspecified heart failure type [facility])  Cellulitis of left lower extremity  Hyponatremia    ED Discharge Orders     None          Birdena Mliss RIGGERS 04/03/24 2007    Zammit, Joseph, MD 04/08/24 1127  "

## 2024-04-03 NOTE — ED Notes (Signed)
 Provider notified of heart rate jumping from 104-131.

## 2024-04-03 NOTE — ED Notes (Signed)
 Patient transported to CT

## 2024-04-03 NOTE — H&P (Incomplete)
 " History and Physical  Glenda Nguyen FMW:[id number] DOB: Aug 17, 1934 DOA: 04/03/2024  Referring physician: Birdena Clarity, PA-EDP  PCP: Vick Lurie, FNP (Inactive)  Outpatient Specialists: Orthopedic surgery. Patient coming from: Home.  Chief Complaint: Leg swelling and cough.  HPI: Glenda Nguyen is a 89 y.o. female with medical history significant for hypertension, hyperlipidemia, recently admitted on 03/24/2024 and discharged home on 03/31/2024 with new diagnoses of atrial fibrillation and hyponatremia, who presents to the ER with complaints of legs swelling and >2 weeks cough.  Denies any recent injury.  Has had difficulty ambulating due to the pain associated with the swelling of her feet and legs.  In the ER, tachycardic and tachypneic.  Chest x-ray was nonacute.  proBNP 6228.  CTA was negative for pulmonary embolism however showed a large right hilar lymph node measuring 14 mm.  Trace bilateral pleural effusions, left greater than right.  Volume overload on exam.    Has a dorsal left foot scar that has been present for months and is non tender without significant erythema, doubt cellulitis.  The patient received IV Lasix  in the ER.  Due to concerns for left dorsal foot cellulitis the patient also received IV clindamycin  by EDP.  Admitted by [facility], hospitalist service, for further management of acute on chronic HFpEF and questionable left foot cellulitis.  ED Course: Temperature 98.5.  BP 140/92, pulse 126, respiration rate 18, O2 saturation 96% on room  Review of Systems: Review of systems as noted in the HPI. All other systems reviewed and are negative.   Past Medical History:  Diagnosis Date   High cholesterol    Hypertension    Urticaria    Past Surgical History:  Procedure Laterality Date   CATARACT EXTRACTION      Social History:  reports that she has never smoked. She has never used smokeless tobacco. She reports that she does not drink alcohol  and does not use  drugs.   Allergies[1]  Family History  Problem Relation Age of Onset   Allergic rhinitis Neg Hx    Asthma Neg Hx    Eczema Neg Hx    Urticaria Neg Hx       Prior to Admission medications  Medication Sig Start Date End Date Taking? Authorizing Provider  apixaban  (ELIQUIS ) 5 MG TABS tablet Take 1 tablet (5 mg total) by mouth 2 (two) times daily. 03/31/24   Ricky Fines, MD  CALCIUM PO Take 1 tablet by mouth daily.    [provider]  CRANBERRY PO Take 1 tablet by mouth daily.    [provider]  diltiazem  (CARDIZEM  CD) 120 MG 24 hr capsule Take 1 capsule (120 mg total) by mouth daily. 03/31/24   Ricky Fines, MD  metoprolol  tartrate (LOPRESSOR ) 25 MG tablet Take 1 tablet (25 mg total) by mouth 2 (two) times daily. 03/31/24   Ricky Fines, MD  torsemide  (DEMADEX ) 20 MG tablet Take 1.5 tablets (30 mg total) by mouth daily. 04/01/24   Ricky Fines, MD  triamcinolone cream (KENALOG) 0.5 % Apply 1 application  topically 3 (three) times daily as needed.    [provider]    Physical Exam: BP (!) 140/92 (BP Location: Right Arm)   Pulse 97   Temp 98.5 F (36.9 C) (Oral)   Resp 18   Ht 5' 7 (1.702 m)   SpO2 96%   BMI 25.90 kg/m   General: 89 y.o. year-old female well developed well nourished in no acute distress.  Alert and oriented  x3. Cardiovascular: Regular rate and rhythm with no rubs or gallops.  No thyromegaly or JVD noted.  No lower extremity edema. 2/4 pulses in all 4 extremities. Respiratory: Clear to auscultation with no wheezes or rales. Good inspiratory effort. Abdomen: Soft nontender nondistended with normal bowel sounds x4 quadrants. Muskuloskeletal: No cyanosis, clubbing or edema noted bilaterally Neuro: CN II-XII intact, strength, sensation, reflexes Skin: No ulcerative lesions noted or rashes Psychiatry: Judgement and insight appear normal. Mood is appropriate for condition and setting          Labs on Admission:  Basic Metabolic  Panel: Recent Labs  Lab 03/28/24 0832 03/29/24 0404 03/29/24 1805 03/30/24 0131 03/30/24 0748 03/30/24 1537 03/31/24 0402 04/03/24 1232  NA 125* 123*   < > 129* 132* 130* 129* 127*  K 4.0 4.2  --  4.6  --   --  4.4 3.6  CL 89* 87*  --  94*  --   --  93* 90*  CO2 23 31  --  31  --   --  33* 20*  GLUCOSE 114* 86  --  96  --   --  85 99  BUN 13 14  --  14  --   --  15 19  CREATININE 0.67 0.63  --  0.61  --   --  0.71 0.70  CALCIUM 8.6* 8.0*  --  7.2*  --   --  8.3* 8.4*  MG  --  2.0  --   --   --   --   --   --    < > = values in this interval not displayed.   Liver Function Tests: No results for input(s): AST, ALT, ALKPHOS, BILITOT, PROT, ALBUMIN in the last 168 hours. No results for input(s): LIPASE, AMYLASE in the last 168 hours. No results for input(s): AMMONIA in the last 168 hours. CBC: Recent Labs  Lab 04/03/24 1232  WBC 5.8  NEUTROABS 4.9  HGB 10.0*  HCT 29.2*  MCV 90.1  PLT 368   Cardiac Enzymes: No results for input(s): CKTOTAL, CKMB, CKMBINDEX, TROPONINI in the last 168 hours.  BNP (last 3 results) No results for input(s): BNP in the last 8760 hours.  ProBNP (last 3 results) Recent Labs    03/24/24 1110 04/03/24 1232  PROBNP 2,551.0* 6,228.0*    CBG: No results for input(s): GLUCAP in the last 168 hours.  Radiological Exams on Admission: CT Angio Chest PE W and/or Wo Contrast Result Date: 04/03/2024 EXAM: CTA CHEST 04/03/2024 06:44:08 PM TECHNIQUE: CTA of the chest was performed without and with the administration of 75 mL of iohexol  (OMNIPAQUE ) 350 MG/ML injection. Multiplanar reformatted images are provided for review. MIP images are provided for review. Automated exposure control, iterative reconstruction, and/or weight based adjustment of the mA/kV was utilized to reduce the radiation dose to as low as reasonably achievable. COMPARISON: None available. CLINICAL HISTORY: Pulmonary embolism (PE) suspected, high prob.  FINDINGS: PULMONARY ARTERIES: Pulmonary arteries are adequately opacified for evaluation. No acute pulmonary embolus. Main pulmonary artery is normal in caliber. MEDIASTINUM: The heart and pericardium demonstrate no acute abnormality. There are atherosclerotic calcifications of the aorta. LYMPH NODES: Large right hilar lymph node measuring 14 mm. No mediastinal or axillary lymphadenopathy. LUNGS AND PLEURA: There is a trace of bilateral pleural effusions, left greater than right. No pneumothorax. There is atelectasis in the lingula. There is some scarring in both lung apices. There are calcified granulomas in the right upper lobe. The lungs are  otherwise clear. UPPER ABDOMEN: Limited images of the upper abdomen are unremarkable. SOFT TISSUES AND BONES: No acute bone or soft tissue abnormality. IMPRESSION: 1. No pulmonary embolism. 2. Large right hilar lymph node measuring 14 mm. 3. Trace bilateral pleural effusions, left greater than right. Electronically signed by: Greig Pique MD 04/03/2024 07:28 PM EST RP Workstation: [id number]   DG Foot Complete Left Result Date: 04/03/2024 CLINICAL DATA:  Bruising and edema of left foot. EXAM: LEFT FOOT - COMPLETE 3+ VIEW COMPARISON:  None Available. FINDINGS: There is no evidence of acute fracture or dislocation. There is no evidence of arthropathy or other focal bone abnormality. Soft tissues are unremarkable. IMPRESSION: Negative. Electronically Signed   By: Marcey Moan M.D.   On: 04/03/2024 16:45   DG Chest 2 View Result Date: 04/03/2024 CLINICAL DATA:  Lower extremity swelling. EXAM: CHEST - 2 VIEW COMPARISON:  March 24, 2024 FINDINGS: The cardiac silhouette is mildly enlarged and unchanged in size. Mild, chronic appearing increased interstitial lung markings are seen without evidence of focal consolidation or pneumothorax. A small left pleural effusion is suspected. A deformity of indeterminate age is seen along the distal right clavicle. Multilevel  degenerative changes are present throughout the thoracic spine. IMPRESSION: 1. Chronic appearing increased interstitial lung markings without evidence of acute or active cardiopulmonary disease. 2. Small left pleural effusion. 3. Deformity of indeterminate age along the distal right clavicle. Correlation with physical examination is recommended to determine the presence of point tenderness. Electronically Signed   By: Suzen Dials M.D.   On: 04/03/2024 13:11    EKG: I independently viewed the EKG done and my findings are as followed:    Assessment/Plan Present on Admission:  Acute on chronic diastolic (congestive) heart failure (HCC)  Principal Problem:   Acute on chronic diastolic (congestive) heart failure (HCC)  Acute on chronic diastolic CHF proBNP greater than 6200, bilateral lower extremity edema Last 2D echo done on 03/25/2024 revealed LVEF 65 to 70% Continue diuresis Monitor strict I's and O's and daily weight  Elevated troponin, suspect demand ischemia in the setting of the above High-sensitivity troponin peaked at 53 and downtrended to 42 No evidence of acute ischemia on 12-lead EKG No reported anginal symptoms Continue to monitor on telemetry  Questionable left lower extremity cellulitis, POA Has a dorsal left foot scar that has been present for months and is non tender without significant erythema, doubt cellulitis  Concern for early pneumonia with persistent productive cough for more than 2 weeks-non-smoker, no exposure to tobacco. Monitor peripheral blood cultures x 2. Monitor fever curve and WBCs. Rocephin  and IV azithromycin  initiated. As needed antitussives Sputum culture  Large right hilar lymph node measuring 14 mm Possibly reactive from infection, will need to be monitored Rule out other causes.  Persistent atrial fibrillation, on Eliquis  Currently rate controlled Resume home p.o. Lopressor  12.5 mg twice daily  Resume home Eliquis  Monitor on  telemetry.  Hyponatremia Serum sodium 127 Encourage oral intake Repeat BMP in the morning  Mild high anion gap metabolic acidosis Serum bicarb 20, anion gap 17 Continue diuresing and repeat BMP in the morning.  Generalized weakness PT OT evaluation Fall precautions.   Critical care time: 55 minutes.   DVT prophylaxis: Home Eliquis  twice daily  Code Status: Full code.  Family Communication: None at bedside.  Disposition Plan: Admitted to telemetry unit.  Consults called: None.  Admission status: Inpatient status.   Status is: Inpatient The patient requires at least 2 midnights for further evaluation and  treatment of present condition.   Glenda LOISE Hurst MD [facility] Pager [phone]  If 7PM-7AM, please contact night-coverage [email or web address] Password [email or web address]  04/03/2024, 11:37 PM      [1]  Allergies Allergen Reactions   Penicillins Rash   "

## 2024-04-03 NOTE — ED Triage Notes (Signed)
 Pt with swelling to bilateral legs and feet.  + cough recent discharge from here with dx of AFIB. Family member states pt has been c/o pain to legs and feet and not able to walk due to pain, pt denies pain at present.

## 2024-04-03 NOTE — ED Notes (Addendum)
 Placed pt on purewick

## 2024-04-04 DIAGNOSIS — E871 Hypo-osmolality and hyponatremia: Secondary | ICD-10-CM | POA: Diagnosis not present

## 2024-04-04 DIAGNOSIS — E876 Hypokalemia: Secondary | ICD-10-CM

## 2024-04-04 DIAGNOSIS — J111 Influenza due to unidentified influenza virus with other respiratory manifestations: Secondary | ICD-10-CM | POA: Diagnosis not present

## 2024-04-04 DIAGNOSIS — I4891 Unspecified atrial fibrillation: Secondary | ICD-10-CM

## 2024-04-04 DIAGNOSIS — I5033 Acute on chronic diastolic (congestive) heart failure: Secondary | ICD-10-CM | POA: Diagnosis not present

## 2024-04-04 LAB — CBC
HCT: 27.8 % — ABNORMAL LOW (ref 36.0–46.0)
Hemoglobin: 9.7 g/dL — ABNORMAL LOW (ref 12.0–15.0)
MCH: 30.8 pg (ref 26.0–34.0)
MCHC: 34.9 g/dL (ref 30.0–36.0)
MCV: 88.3 fL (ref 80.0–100.0)
Platelets: 362 K/uL (ref 150–400)
RBC: 3.15 MIL/uL — ABNORMAL LOW (ref 3.87–5.11)
RDW: 14.7 % (ref 11.5–15.5)
WBC: 5.1 K/uL (ref 4.0–10.5)
nRBC: 0 % (ref 0.0–0.2)

## 2024-04-04 LAB — EXPECTORATED SPUTUM ASSESSMENT W GRAM STAIN, RFLX TO RESP C: Special Requests: NORMAL

## 2024-04-04 LAB — BASIC METABOLIC PANEL WITH GFR
Anion gap: 8 (ref 5–15)
BUN: 16 mg/dL (ref 8–23)
CO2: 33 mmol/L — ABNORMAL HIGH (ref 22–32)
Calcium: 8.1 mg/dL — ABNORMAL LOW (ref 8.9–10.3)
Chloride: 86 mmol/L — ABNORMAL LOW (ref 98–111)
Creatinine, Ser: 0.75 mg/dL (ref 0.44–1.00)
GFR, Estimated: >60 mL/min
Glucose, Bld: 95 mg/dL (ref 70–99)
Potassium: 3.2 mmol/L — ABNORMAL LOW (ref 3.5–5.1)
Sodium: 127 mmol/L — ABNORMAL LOW (ref 135–145)

## 2024-04-04 LAB — MRSA NEXT GEN BY PCR, NASAL: MRSA by PCR Next Gen: NOT DETECTED

## 2024-04-04 LAB — RESP PANEL BY RT-PCR (RSV, FLU A&B, COVID)  RVPGX2
Influenza A by PCR: POSITIVE — AB
Influenza B by PCR: NEGATIVE
Resp Syncytial Virus by PCR: NEGATIVE
SARS Coronavirus 2 by RT PCR: NEGATIVE

## 2024-04-04 LAB — MAGNESIUM: Magnesium: 2 mg/dL (ref 1.7–2.4)

## 2024-04-04 LAB — PHOSPHORUS: Phosphorus: 3.9 mg/dL (ref 2.5–4.6)

## 2024-04-04 MED ORDER — DILTIAZEM LOAD VIA INFUSION
10.0000 mg | Freq: Once | INTRAVENOUS | Status: AC
  Administered 2024-04-04: 10 mg via INTRAVENOUS
  Filled 2024-04-04: qty 10

## 2024-04-04 MED ORDER — GUAIFENESIN ER 600 MG PO TB12
600.0000 mg | ORAL_TABLET | Freq: Two times a day (BID) | ORAL | Status: AC
  Administered 2024-04-04 – 2024-04-06 (×6): 600 mg via ORAL
  Filled 2024-04-04 (×6): qty 1

## 2024-04-04 MED ORDER — DILTIAZEM HCL-DEXTROSE 125-5 MG/125ML-% IV SOLN (PREMIX)
5.0000 mg/h | INTRAVENOUS | Status: DC
  Administered 2024-04-04: 5 mg/h via INTRAVENOUS
  Filled 2024-04-04 (×2): qty 125

## 2024-04-04 MED ORDER — CHLORHEXIDINE GLUCONATE CLOTH 2 % EX PADS
6.0000 | MEDICATED_PAD | Freq: Every day | CUTANEOUS | Status: DC
  Administered 2024-04-04 – 2024-04-14 (×11): 6 via TOPICAL

## 2024-04-04 MED ORDER — FUROSEMIDE 10 MG/ML IJ SOLN
40.0000 mg | Freq: Two times a day (BID) | INTRAMUSCULAR | Status: DC
  Administered 2024-04-04 – 2024-04-05 (×3): 40 mg via INTRAVENOUS
  Filled 2024-04-04 (×3): qty 4

## 2024-04-04 MED ORDER — POTASSIUM CHLORIDE CRYS ER 20 MEQ PO TBCR
20.0000 meq | EXTENDED_RELEASE_TABLET | Freq: Three times a day (TID) | ORAL | Status: AC
  Administered 2024-04-04 (×3): 20 meq via ORAL
  Filled 2024-04-04 (×2): qty 1
  Filled 2024-04-04: qty 2

## 2024-04-04 MED ORDER — METOPROLOL TARTRATE 25 MG PO TABS
12.5000 mg | ORAL_TABLET | Freq: Two times a day (BID) | ORAL | Status: DC
  Administered 2024-04-04 (×2): 12.5 mg via ORAL
  Filled 2024-04-04 (×2): qty 1

## 2024-04-04 MED ORDER — SODIUM CHLORIDE 0.9 % IV SOLN: 500.0000 mg | INTRAVENOUS | Status: DC

## 2024-04-04 MED ORDER — SODIUM CHLORIDE 0.9 % IV SOLN
1.0000 g | INTRAVENOUS | Status: DC
  Administered 2024-04-04 – 2024-04-06 (×3): 1 g via INTRAVENOUS
  Filled 2024-04-04 (×3): qty 10

## 2024-04-04 MED ORDER — AZITHROMYCIN 250 MG PO TABS
500.0000 mg | ORAL_TABLET | Freq: Once | ORAL | Status: AC
  Administered 2024-04-04: 500 mg via ORAL
  Filled 2024-04-04: qty 2

## 2024-04-04 MED ORDER — IPRATROPIUM-ALBUTEROL 0.5-2.5 (3) MG/3ML IN SOLN
3.0000 mL | Freq: Four times a day (QID) | RESPIRATORY_TRACT | Status: DC
  Administered 2024-04-04 – 2024-04-07 (×15): 3 mL via RESPIRATORY_TRACT
  Filled 2024-04-04 (×14): qty 3

## 2024-04-04 MED ORDER — AZITHROMYCIN 250 MG PO TABS: 250.0000 mg | ORAL_TABLET | Freq: Every day | ORAL | Status: DC

## 2024-04-04 NOTE — Plan of Care (Signed)
" °  Problem: Acute Rehab OT Goals (only OT should resolve) Goal: Pt. Will Perform Upper Body Bathing Flowsheets (Taken 04/04/2024 1519) Pt Will Perform Upper Body Bathing:  Independently  sitting Goal: Pt. Will Perform Lower Body Dressing Flowsheets (Taken 04/04/2024 1519) Pt Will Perform Lower Body Dressing:  Independently  sitting/lateral leans Goal: Pt/Caregiver Will Perform Home Exercise Program Flowsheets (Taken 04/04/2024 1519) Pt/caregiver will Perform Home Exercise Program:  Both right and left upper extremity  Increased strength  With written HEP provided  Chiquita Sermon, OTR/L    "

## 2024-04-04 NOTE — Evaluation (Signed)
 Physical Therapy Evaluation Patient Details Name: PIERCE BIAGINI MRN: [id number] DOB: 17-May-1934 Today's Date: 04/04/2024  History of Present Illness  Glenda Nguyen is a 89 y.o. female with medical history significant for hypertension, hyperlipidemia, recently admitted on 03/24/2024 and discharged home on 03/31/2024 with new diagnoses of atrial fibrillation and hyponatremia, who presents to the ER with complaints of legs swelling and >2 weeks cough.  Denies any recent injury.  Has had difficulty ambulating due to the pain associated with the swelling of her feet and legs.     In the ER, tachycardic and tachypneic.  Chest x-ray was nonacute.  proBNP 6228.  CTA was negative for pulmonary embolism however showed a large right hilar lymph node measuring 14 mm.  Trace bilateral pleural effusions, left greater than right.  Volume overload on exam.       Has a dorsal left foot scar that has been present for months and is non tender without significant erythema, doubt cellulitis.  The patient received IV Lasix  in the ER.  Due to concerns for left dorsal foot cellulitis the patient also received IV clindamycin  by EDP.     Admitted by [facility], hospitalist service, for further management of acute on chronic HFpEF and questionable left foot cellulitis.   Clinical Impression  Patient agreeable to PT evaluation. Patient was recently admitted and confirms home history as well as PLOF being community ambulator with intermittent use of walking stick and independent with ADLs. Reports no falls or issues with mobility since discharged home. This date, patient remains CGA/supervision for all bed mobility, transfers, and ambulation without AD. Pt completes lower body cleaning/pericare in standing position during session with CGA/supervision but is assisted with lower body dressing. Pt demonstrates some deficits in endurance, strength, and balance, in which she will benefit from continued skilled physical therapy acutely and  in recommended venue in order to address. Pt tolerates sitting in chair at EOS, alarm set, call button in reach and all needs met.        If plan is discharge home, recommend the following: A little help with walking and/or transfers;Assistance with cooking/housework;Assist for transportation;Help with stairs or ramp for entrance   Can travel by private vehicle        Equipment Recommendations None recommended by PT  Recommendations for Other Services       Functional Status Assessment Patient has had a recent decline in their functional status and demonstrates the ability to make significant improvements in function in a reasonable and predictable amount of time.     Precautions / Restrictions Precautions Precautions: Fall Recall of Precautions/Restrictions: Intact Restrictions Weight Bearing Restrictions Per Provider Order: No      Mobility  Bed Mobility Overal bed mobility: Needs Assistance Bed Mobility: Supine to Sit     Supine to sit: Supervision, Contact guard     General bed mobility comments: slow labored movement, HOB slightly elevated, no use of rails, inc time    Transfers Overall transfer level: Needs assistance Equipment used: None Transfers: Sit to/from Stand, Bed to chair/wheelchair/BSC Sit to Stand: Contact guard assist   Step pivot transfers: Contact guard assist       General transfer comment: 3 STS from bed during session without AD, pt demo slow labored movement but requires no physical assist, CGA for safety during bed to chair without AD, no overt LOB or unsteadiness    Ambulation/Gait Ambulation/Gait assistance: Contact guard assist Gait Distance (Feet): 75 Feet Assistive device: None Gait Pattern/deviations: Step-through pattern,  Decreased step length - right, Decreased step length - left, Decreased stride length, Trunk flexed, Narrow base of support Gait velocity: Dec     General Gait Details: pt ambulates in room and into halls  without AD and CGA, no overt LOB but mild unsteadiness at times in which pt reaches out to railings in halls but does not use, dec velocity  Stairs            Wheelchair Mobility     Tilt Bed    Modified Rankin (Stroke Patients Only)       Balance Overall balance assessment: Mild deficits observed, not formally tested           Pertinent Vitals/Pain Pain Assessment Pain Assessment: No/denies pain    Home Living Family/patient expects to be discharged to:: Private residence Living Arrangements: Children;Other relatives Available Help at Discharge: Family;Available 24 hours/day;Friend(s) Type of Home: House Home Access: Stairs to enter Entrance Stairs-Rails: Right;Left;Can reach both Entrance Stairs-Number of Steps: 3   Home Layout: One level Home Equipment: Other (comment) (walking stick) Additional Comments: information from recent admission, Lives with daughter-in-law  and grandson, also has friends who can be available to assist if needed    Prior Function Prior Level of Function : Independent/Modified Independent;Driving;History of Falls (last six months)             Mobility Comments: information from recent admission, Community ambulator without AD; drives; reported using walking stick as needed to help reach things and boost from a low surfaces such as toilet reports no falls or instances since being discharged last ADLs Comments: Independent.     Extremity/Trunk Assessment   Upper Extremity Assessment Upper Extremity Assessment: Defer to OT evaluation    Lower Extremity Assessment Lower Extremity Assessment: Generalized weakness (MMT grossly 4/5 throughout BLE, L foot scar/wound thats painful with pressure)    Cervical / Trunk Assessment Cervical / Trunk Assessment: Kyphotic  Communication   Communication Communication: No apparent difficulties    Cognition Arousal: Alert Behavior During Therapy: WFL for tasks assessed/performed    PT - Cognitive impairments: No apparent impairments         Following commands: Intact       Cueing Cueing Techniques: Verbal cues, Tactile cues, Visual cues     General Comments      Exercises     Assessment/Plan    PT Assessment Patient needs continued PT services;All further PT needs can be met in the next venue of care  PT Problem List Decreased strength;Decreased range of motion;Decreased activity tolerance;Decreased balance;Decreased mobility       PT Treatment Interventions Balance training;DME instruction;Gait training;Stair training;Functional mobility training;Therapeutic activities;Therapeutic exercise;Patient/family education    PT Goals (Current goals can be found in the Care Plan section)  Acute Rehab PT Goals Patient Stated Goal: Return home PT Goal Formulation: With patient Time For Goal Achievement: 04/11/24 Potential to Achieve Goals: Good    Frequency Min 3X/week     Co-evaluation               AM-PAC PT 6 Clicks Mobility  Outcome Measure Help needed turning from your back to your side while in a flat bed without using bedrails?: None Help needed moving from lying on your back to sitting on the side of a flat bed without using bedrails?: A Little Help needed moving to and from a bed to a chair (including a wheelchair)?: A Little Help needed standing up from a chair using your arms (e.g., wheelchair or  bedside chair)?: A Little Help needed to walk in hospital room?: A Little Help needed climbing 3-5 steps with a railing? : A Little 6 Click Score: 19    End of Session Equipment Utilized During Treatment: Gait belt Activity Tolerance: Patient tolerated treatment well Patient left: in chair;with call bell/phone within reach;with chair alarm set   PT Visit Diagnosis: Unsteadiness on feet (R26.81);Other abnormalities of gait and mobility (R26.89);History of falling (Z91.81);Muscle weakness (generalized) (M62.81)    Time: 9045-8981 PT  Time Calculation (min) (ACUTE ONLY): 24 min   Charges:   PT Evaluation $PT Eval Low Complexity: 1 Low   PT General Charges $$ ACUTE PT VISIT: 1 Visit         12:40 PM, 04/04/2024 Kristopher Delk Powell-Butler, PT, DPT [place] with [facility]

## 2024-04-04 NOTE — Progress Notes (Addendum)
 "          PROGRESS NOTE  Glenda Nguyen FMW:[id number] DOB: 12/08/1934 DOA: 04/03/2024 PCP: Vick Lurie, FNP (Inactive)  Brief History:  89 year old female with a history of HFpEF, paroxysmal atrial fibrillation, hypertension, hyperlipidemia, prediabetes presenting with worsening lower extremity edema and coughing.  The patient was recently admitted to the hospital from 03/24/2024 to 03/31/2024 for hyponatremia, acute HFpEF, and new onset atrial fibrillation with RVR.  She was discharged home with torsemide  30 mg daily, Cardizem  CD 120 milligrams daily, and metoprolol  12.5 mg twice daily. The patient returns because of worsening lower extremity edema and cough.  She has had some difficulty ambulating secondary to pain associated with swelling of her legs and feet.  She denies any fevers, chills, chest pain, shortness breath, hemoptysis, nausea, vomiting, diarrhea, abdominal pain.  In the ED, she was afebrile and hemodynamically stable.  Initially she was tachycardic in the 120s.  She was started on IV furosemide .   Assessment/Plan: Acute on chronic HFpEF - 04/03/2024 CTA chest--negative PE, trace bilateral pleural effusions, left greater than right, biapical scarring,RUL calcified granuloma - 03/25/2024 echo EF 65 to 70%, no WMA, normal RVF - increase lasix  to 40 mg IV bid  Persistent atrial fibrillation - Continue apixaban  - Continue Cardizem  CD - hold metoprolol  due to soft BPs  Elevated troponin - Secondary to demand ischemia - No chest pain presently - Personally reviewed EKG--atrial fibrillation with nonspecific T changes  Hypervolemic hyponatremia - Secondary to fluid overload - Anticipate improvement with diuresis - Monitor BMP  Large right hilar lymph node measuring 14 mm -Possibly reactive, will need to be monitored - Outpatient surveillance and possible PET scan  Generalized weakness PT OT evaluation Fall precautions.  Mild high anion gap metabolic  acidosis Serum bicarb 20, anion gap 17   Hypokalemia - Replete - Check magnesium  2.0         Family Communication: no  Family at bedside  Consultants:  none  Code Status:  FULL   DVT Prophylaxis:  apixaban    Procedures: As Listed in Progress Note Above  Antibiotics: None     Subjective: Patient denies fevers, chills, headache, chest pain, dyspnea, nausea, vomiting, diarrhea, abdominal pain, dysuria, hematuria, hematochezia, and melena.   Objective: Vitals:   04/04/24 0414 04/04/24 0735 04/04/24 1259 04/04/24 1321  BP:   107/63   Pulse:   92   Resp:      Temp:   98.7 F (37.1 C)   TempSrc:   Oral   SpO2:  95% 95% 95%  Weight: 68.6 kg     Height:        Intake/Output Summary (Last 24 hours) at 04/04/2024 1414 Last data filed at 04/04/2024 0810 Gross per 24 hour  Intake 150 ml  Output 1850 ml  Net -1700 ml   Weight change:  Exam:  General:  Pt is alert, follows commands appropriately, not in acute distress HEENT: No icterus, No thrush, No neck mass, Nguyen/AT Cardiovascular: RRR, S1/S2, no rubs, no gallops Respiratory: Bilateral rales.  No wheezing. Abdomen: Soft/+BS, non tender, non distended, no guarding Extremities: 2+LE edema, No lymphangitis, No petechiae, No rashes, no synovitis   Data Reviewed: I have personally reviewed following labs and imaging studies Basic Metabolic Panel: Recent Labs  Lab 03/29/24 0404 03/29/24 1805 03/30/24 0131 03/30/24 0748 03/30/24 1537 03/31/24 0402 04/03/24 1232 04/04/24 0409  NA 123*   < > 129* 132* 130* 129* 127* 127*  K 4.2  --  4.6  --   --  4.4 3.6 3.2*  CL 87*  --  94*  --   --  93* 90* 86*  CO2 31  --  31  --   --  33* 20* 33*  GLUCOSE 86  --  96  --   --  85 99 95  BUN 14  --  14  --   --  15 19 16   CREATININE 0.63  --  0.61  --   --  0.71 0.70 0.75  CALCIUM 8.0*  --  7.2*  --   --  8.3* 8.4* 8.1*  MG 2.0  --   --   --   --   --   --  2.0  PHOS  --   --   --   --   --   --   --  3.9   < > =  values in this interval not displayed.   Liver Function Tests: No results for input(s): AST, ALT, ALKPHOS, BILITOT, PROT, ALBUMIN in the last 168 hours. No results for input(s): LIPASE, AMYLASE in the last 168 hours. No results for input(s): AMMONIA in the last 168 hours. Coagulation Profile: No results for input(s): INR, PROTIME in the last 168 hours. CBC: Recent Labs  Lab 04/03/24 1232 04/04/24 0409  WBC 5.8 5.1  NEUTROABS 4.9  --   HGB 10.0* 9.7*  HCT 29.2* 27.8*  MCV 90.1 88.3  PLT 368 362   Cardiac Enzymes: No results for input(s): CKTOTAL, CKMB, CKMBINDEX, TROPONINI in the last 168 hours. BNP: Invalid input(s): POCBNP CBG: No results for input(s): GLUCAP in the last 168 hours. HbA1C: No results for input(s): HGBA1C in the last 72 hours. Urine analysis:    Component Value Date/Time   COLORURINE YELLOW 03/24/2024 1038   APPEARANCEUR CLEAR 03/24/2024 1038   LABSPEC 1.020 03/24/2024 1038   PHURINE 6.0 03/24/2024 1038   GLUCOSEU NEGATIVE 03/24/2024 1038   HGBUR MODERATE (A) 03/24/2024 1038   BILIRUBINUR NEGATIVE 03/24/2024 1038   KETONESUR 5 (A) 03/24/2024 1038   PROTEINUR 30 (A) 03/24/2024 1038   NITRITE NEGATIVE 03/24/2024 1038   LEUKOCYTESUR NEGATIVE 03/24/2024 1038   Sepsis Labs: @LABRCNTIP (procalcitonin:4,lacticidven:4) )No results found for this or any previous visit (from the past 240 hours).   Scheduled Meds:  apixaban   5 mg Oral BID   [START ON 04/05/2024] azithromycin   250 mg Oral Daily   guaiFENesin   600 mg Oral BID   Influenza vac split trivalent PF  0.5 mL Intramuscular Tomorrow-1000   ipratropium-albuterol   3 mL Nebulization Q6H   metoprolol  tartrate  12.5 mg Oral BID   potassium chloride   20 mEq Oral TID   Continuous Infusions:  cefTRIAXone  (ROCEPHIN )  IV 1 g (04/04/24 0636)    Procedures/Studies: CT Angio Chest PE W and/or Wo Contrast Result Date: 04/03/2024 EXAM: CTA CHEST 04/03/2024 06:44:08 PM  TECHNIQUE: CTA of the chest was performed without and with the administration of 75 mL of iohexol  (OMNIPAQUE ) 350 MG/ML injection. Multiplanar reformatted images are provided for review. MIP images are provided for review. Automated exposure control, iterative reconstruction, and/or weight based adjustment of the mA/kV was utilized to reduce the radiation dose to as low as reasonably achievable. COMPARISON: None available. CLINICAL HISTORY: Pulmonary embolism (PE) suspected, high prob. FINDINGS: PULMONARY ARTERIES: Pulmonary arteries are adequately opacified for evaluation. No acute pulmonary embolus. Main pulmonary artery is normal in caliber. MEDIASTINUM: The heart and pericardium demonstrate no acute abnormality. There are atherosclerotic calcifications of the aorta. LYMPH NODES: Large right hilar  lymph node measuring 14 mm. No mediastinal or axillary lymphadenopathy. LUNGS AND PLEURA: There is a trace of bilateral pleural effusions, left greater than right. No pneumothorax. There is atelectasis in the lingula. There is some scarring in both lung apices. There are calcified granulomas in the right upper lobe. The lungs are otherwise clear. UPPER ABDOMEN: Limited images of the upper abdomen are unremarkable. SOFT TISSUES AND BONES: No acute bone or soft tissue abnormality. IMPRESSION: 1. No pulmonary embolism. 2. Large right hilar lymph node measuring 14 mm. 3. Trace bilateral pleural effusions, left greater than right. Electronically signed by: Greig Pique MD 04/03/2024 07:28 PM EST RP Workstation: [id number]   DG Foot Complete Left Result Date: 04/03/2024 CLINICAL DATA:  Bruising and edema of left foot. EXAM: LEFT FOOT - COMPLETE 3+ VIEW COMPARISON:  None Available. FINDINGS: There is no evidence of acute fracture or dislocation. There is no evidence of arthropathy or other focal bone abnormality. Soft tissues are unremarkable. IMPRESSION: Negative. Electronically Signed   By: Marcey Moan M.D.   On:  04/03/2024 16:45   DG Chest 2 View Result Date: 04/03/2024 CLINICAL DATA:  Lower extremity swelling. EXAM: CHEST - 2 VIEW COMPARISON:  March 24, 2024 FINDINGS: The cardiac silhouette is mildly enlarged and unchanged in size. Mild, chronic appearing increased interstitial lung markings are seen without evidence of focal consolidation or pneumothorax. A small left pleural effusion is suspected. A deformity of indeterminate age is seen along the distal right clavicle. Multilevel degenerative changes are present throughout the thoracic spine. IMPRESSION: 1. Chronic appearing increased interstitial lung markings without evidence of acute or active cardiopulmonary disease. 2. Small left pleural effusion. 3. Deformity of indeterminate age along the distal right clavicle. Correlation with physical examination is recommended to determine the presence of point tenderness. Electronically Signed   By: Suzen Dials M.D.   On: 04/03/2024 13:11   US  Venous Img Upper Uni Left (DVT) Result Date: 03/27/2024 CLINICAL DATA:  Left upper extremity edema EXAM: LEFT UPPER EXTREMITY VENOUS DOPPLER ULTRASOUND TECHNIQUE: Gray-scale sonography with graded compression, as well as color Doppler and duplex ultrasound were performed to evaluate the upper extremity deep venous system from the level of the subclavian vein and including the jugular, axillary, basilic, radial, ulnar and upper cephalic vein. Spectral Doppler was utilized to evaluate flow at rest and with distal augmentation maneuvers. COMPARISON:  None Available. FINDINGS: Contralateral Subclavian Vein: Respiratory phasicity is normal and symmetric with the symptomatic side. No evidence of thrombus. Normal compressibility. Internal Jugular Vein: No evidence of thrombus. Normal compressibility, respiratory phasicity and response to augmentation. Subclavian Vein: No evidence of thrombus. Normal compressibility, respiratory phasicity and response to augmentation. Axillary  Vein: No evidence of thrombus. Normal compressibility, respiratory phasicity and response to augmentation. Cephalic Vein: Left cephalic vein at the antecubital fossa demonstrates ill-defined hypoechoic intraluminal thrombus and is noncompressible compatible with superficial thrombosis/thrombophlebitis. Basilic Vein: No evidence of thrombus. Normal compressibility, respiratory phasicity and response to augmentation. Brachial Veins: No evidence of thrombus. Normal compressibility, respiratory phasicity and response to augmentation. Radial Veins: No evidence of thrombus. Normal compressibility, respiratory phasicity and response to augmentation. Ulnar Veins: No evidence of thrombus. Normal compressibility, respiratory phasicity and response to augmentation. IMPRESSION: 1. Negative for left upper extremity DVT. 2. Positive for left cephalic vein superficial thrombosis/thrombophlebitis at the antecubital fossa. Electronically Signed   By: CHRISTELLA.  Shick M.D.   On: 03/27/2024 10:30   ECHOCARDIOGRAM COMPLETE Result Date: 03/25/2024    ECHOCARDIOGRAM REPORT   Patient Name:  EDISON LULLA ROMNEY Date of Exam: 03/25/2024 Medical Rec #:  [id number]           Height:       67.0 in Accession #:    [id number]          Weight:       188.3 lb Date of Birth:  13-Apr-1934           BSA:          1.971 m Patient Age:    89 years            BP:           126/57 mmHg Patient Gender: F                   HR:           102 bpm. Exam Location:  Inpatient Procedure: 2D Echo, Cardiac Doppler and Color Doppler (Both Spectral and Color            Flow Doppler were utilized during procedure). Indications:    Atrial fibrillation  History:        Patient has no prior history of Echocardiogram examinations.                 Arrythmias:Atrial Fibrillation; Risk Factors:Hypertension.  Sonographer:    Philomena Daring Referring Phys: [email or web address] Suhailah Kwan IMPRESSIONS  1. Left ventricular ejection fraction, by estimation, is 65 to 70%. The left ventricle has normal  function. The left ventricle has no regional wall motion abnormalities. There is mild concentric left ventricular hypertrophy. Left ventricular diastolic parameters are indeterminate.  2. Right ventricular systolic function is normal. The right ventricular size is normal.  3. The mitral valve is normal in structure. No evidence of mitral valve regurgitation. No evidence of mitral stenosis.  4. The aortic valve was not well visualized. There is mild calcification of the aortic valve. Aortic valve regurgitation is mild. Aortic valve sclerosis is present, with no evidence of aortic valve stenosis.  5. The inferior vena cava is normal in size with greater than 50% respiratory variability, suggesting right atrial pressure of 3 mmHg. Comparison(s): No prior Echocardiogram. FINDINGS  Left Ventricle: Left ventricular ejection fraction, by estimation, is 65 to 70%. The left ventricle has normal function. The left ventricle has no regional wall motion abnormalities. The left ventricular internal cavity size was normal in size. There is  mild concentric left ventricular hypertrophy. Left ventricular diastolic parameters are indeterminate. Right Ventricle: The right ventricular size is normal. No increase in right ventricular wall thickness. Right ventricular systolic function is normal. Left Atrium: Left atrial size was normal in size. Right Atrium: Right atrial size was normal in size. Pericardium: There is no evidence of pericardial effusion. Mitral Valve: The mitral valve is normal in structure. No evidence of mitral valve regurgitation. No evidence of mitral valve stenosis. Tricuspid Valve: The tricuspid valve is normal in structure. Tricuspid valve regurgitation is mild . No evidence of tricuspid stenosis. Aortic Valve: The aortic valve was not well visualized. There is mild calcification of the aortic valve. Aortic valve regurgitation is mild. Aortic regurgitation PHT measures 465 msec. Aortic valve sclerosis is present,  with no evidence of aortic valve stenosis. Pulmonic Valve: The pulmonic valve was normal in structure. Pulmonic valve regurgitation is mild. No evidence of pulmonic stenosis. Aorta: The aortic root and ascending aorta are structurally normal, with no evidence of dilitation. Venous: The inferior vena cava is normal in size with greater than  50% respiratory variability, suggesting right atrial pressure of 3 mmHg. IAS/Shunts: No atrial level shunt detected by color flow Doppler.  LEFT VENTRICLE PLAX 2D LVIDd:         3.90 cm   Diastology LVIDs:         2.60 cm   LV e' medial:    10.23 cm/s LV PW:         1.10 cm   LV E/e' medial:  9.5 LV IVS:        1.10 cm   LV e' lateral:   14.80 cm/s LVOT diam:     1.80 cm   LV E/e' lateral: 6.5 LV SV:         42 LV SV Index:   22 LVOT Area:     2.54 cm  RIGHT VENTRICLE             IVC RV Basal diam:  2.90 cm     IVC diam: 1.40 cm RV Mid diam:    2.20 cm RV S prime:     14.00 cm/s TAPSE (M-mode): 2.2 cm LEFT ATRIUM             Index        RIGHT ATRIUM           Index LA diam:        3.30 cm 1.67 cm/m   RA Area:     15.70 cm LA Vol (A2C):   46.8 ml 23.74 ml/m  RA Volume:   37.00 ml  18.77 ml/m LA Vol (A4C):   42.7 ml 21.66 ml/m LA Biplane Vol: 48.7 ml 24.71 ml/m  AORTIC VALVE LVOT Vmax:   129.33 cm/s LVOT Vmean:  83.367 cm/s LVOT VTI:    0.167 m AI PHT:      465 msec  AORTA Ao Root diam: 3.20 cm Ao Asc diam:  3.20 cm MITRAL VALVE               TRICUSPID VALVE MV Area (PHT): 4.67 cm    TR Peak grad:   30.9 mmHg MV Decel Time: 163 msec    TR Vmax:        278.00 cm/s MV E velocity: 96.90 cm/s                            SHUNTS                            Systemic VTI:  0.17 m                            Systemic Diam: 1.80 cm Stanly Leavens MD Electronically signed by Stanly Leavens MD Signature Date/Time: 03/25/2024/12:13:57 PM    Final    DG Chest 1 View Result Date: 03/24/2024 CLINICAL DATA:  Weakness and fall. EXAM: CHEST  1 VIEW COMPARISON:  None available.  FINDINGS: The heart is mildly enlarged. No pulmonary vascular congestion. Mediastinal silhouette within normal limits. Lungs are clear. Deformity of the lateral RIGHT clavicle suspicious for fracture. IMPRESSION: 1. Mild cardiomegaly. 2. Deformity of the lateral RIGHT clavicle suspicious for fracture. Please correlate for focal tenderness. Electronically Signed   By: Aliene Lloyd M.D.   On: 03/24/2024 14:04   DG Knee Complete 4 Views Right Result Date: 03/24/2024 EXAM: 4 VIEW(S) XRAY OF THE KNEE 03/24/2024 11:07:00 AM COMPARISON: None available.  CLINICAL HISTORY: fall FINDINGS: BONES AND JOINTS: No acute fracture. No malalignment. No significant joint effusion. Mild lateral compartment joint space narrowing. Marginal osteophytosis and subchondral sclerosis of the lateral compartment. Additional subtle marginal osteophytosis of the medial compartment. SOFT TISSUES: Mild diffuse soft tissue swelling. IMPRESSION: 1. No acute fracture or dislocation. 2. Mild diffuse soft tissue swelling. 3. Lateral compartment predominant osteoarthritis. Electronically signed by: Donnice Mania MD 03/24/2024 11:27 AM EST RP Workstation: [id number]   CT Cervical Spine Wo Contrast Result Date: 03/24/2024 EXAM: CT CERVICAL SPINE WITHOUT CONTRAST 03/24/2024 10:59:54 AM TECHNIQUE: CT of the cervical spine was performed without the administration of intravenous contrast. Multiplanar reformatted images are provided for review. Automated exposure control, iterative reconstruction, and/or weight based adjustment of the mA/kV was utilized to reduce the radiation dose to as low as reasonably achievable. COMPARISON: None available. CLINICAL HISTORY: Neck trauma (Age >= 65y) FINDINGS: BONES AND ALIGNMENT: Straightening of the normal cervical lordosis. Trace degenerative anterolisthesis of C2 on C3 and C3 on C4. Additional trace degenerative anterolisthesis of C7 on T1. No acute fracture or traumatic malalignment. DEGENERATIVE CHANGES: Moderate  disc space narrowing at C4-C5. Additional disc space narrowing at C5-C6 and C6-C7. Mild degenerative endplate osteophytes at multiple levels. There are disc osteophyte complexes at multiple levels without evidence of high grade osseous spinal canal stenosis. Facet arthrosis and uncovertebral hypertrophy at multiple levels. SOFT TISSUES: No prevertebral soft tissue swelling. LUNGS: Scarring and atelectasis in the right lung apex. IMPRESSION: 1. No evidence of acute traumatic injury. 2. Degenerative changes as above. Electronically signed by: Donnice Mania MD 03/24/2024 11:25 AM EST RP Workstation: [id number]   CT Head Wo Contrast Result Date: 03/24/2024 EXAM: CT HEAD WITHOUT CONTRAST 03/24/2024 10:59:54 AM TECHNIQUE: CT of the head was performed without the administration of intravenous contrast. Automated exposure control, iterative reconstruction, and/or weight based adjustment of the mA/kV was utilized to reduce the radiation dose to as low as reasonably achievable. COMPARISON: None available. CLINICAL HISTORY: Head trauma, minor (Age >= 65y). FINDINGS: BRAIN AND VENTRICLES: No acute hemorrhage. 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 Mild chronic microvascular ischemic change. No evidence of acute infarct. No hydrocephalus. No extra-axial collection. No mass effect or midline shift. ORBITS: Bilateral lens replacement noted. SINUSES: Trace right mastoid effusion. SOFT TISSUES AND SKULL: Right periorbital soft tissue swelling. No skull fracture. IMPRESSION: 1. No acute intracranial abnormality. 2. Right periorbital soft tissue swelling. 3. Focal encephalomalacia along the anterolateral left temporal lobe, suggestive of small remote infarct or sequelae of prior trauma. Electronically signed by: Donnice Mania MD 03/24/2024 11:18 AM EST RP Workstation: [id number]    Alm Schneider, DO  [facility]  If 7PM-7AM, please  contact night-coverage [email or web address] Password [email or web address] 04/04/2024, 2:14 PM   LOS: 1 day   "

## 2024-04-04 NOTE — Evaluation (Signed)
 Occupational Therapy Evaluation Patient Details Name: Glenda Nguyen MRN: [id number] DOB: 1934-10-14 Today's Date: 04/04/2024   History of Present Illness   Glenda Nguyen is a 89 y.o. female with medical history significant for hypertension, hyperlipidemia, recently admitted on 03/24/2024 and discharged home on 03/31/2024 with new diagnoses of atrial fibrillation and hyponatremia, who presents to the ER with complaints of legs swelling and >2 weeks cough.  Denies any recent injury.  Has had difficulty ambulating due to the pain associated with the swelling of her feet and legs.     In the ER, tachycardic and tachypneic.  Chest x-ray was nonacute.  proBNP 6228.  CTA was negative for pulmonary embolism however showed a large right hilar lymph node measuring 14 mm.  Trace bilateral pleural effusions, left greater than right.  Volume overload on exam.       Has a dorsal left foot scar that has been present for months and is non tender without significant erythema, doubt cellulitis.  The patient received IV Lasix  in the ER.  Due to concerns for left dorsal foot cellulitis the patient also received IV clindamycin  by EDP.     Admitted by [facility], hospitalist service, for further management of acute on chronic HFpEF and questionable left foot cellulitis.     Clinical Impressions Pt agreeable to OT evaluation. Pt was recently hospitalized for a fall on 12/27. No change with home set up since last hospitalization. Pt able to complete bed mobility with bed fat with increased time but no physical assist required. Pt ambulated to bathroom with supervision and completed toilet t/f with use of grab bars. Pt able to maintain balance while standing at sink to wash hands with supervision. No adaptive equipment used this session. Pt reports that she has help from her daughter in law if needed at home. Pt would continue to benefit from OT services.      If plan is discharge home, recommend the following:   A  little help with walking and/or transfers;A little help with bathing/dressing/bathroom;Assistance with cooking/housework;Assist for transportation;Help with stairs or ramp for entrance     Functional Status Assessment   Patient has had a recent decline in their functional status and demonstrates the ability to make significant improvements in function in a reasonable and predictable amount of time.       Precautions/Restrictions   Precautions Precautions: Fall Recall of Precautions/Restrictions: Intact Restrictions Weight Bearing Restrictions Per Provider Order: No     Mobility Bed Mobility Overal bed mobility: Modified Independent             General bed mobility comments: slow labored movement, increased time, HOB flat    Transfers Overall transfer level: Needs assistance Equipment used: None Transfers: Sit to/from Stand, Bed to chair/wheelchair/BSC Sit to Stand: Supervision     Step pivot transfers: Supervision     General transfer comment: no A/D, supervision for safety, no LOB      Balance Overall balance assessment: Modified Independent                                         ADL either performed or assessed with clinical judgement   ADL Overall ADL's : Needs assistance/impaired Eating/Feeding: Set up;Sitting   Grooming: Standing;Set up   Upper Body Bathing: Set up;Sitting   Lower Body Bathing: Set up;Sitting/lateral leans   Upper Body Dressing : Set up;Sitting  Lower Body Dressing: Set up;Sitting/lateral leans   Toilet Transfer: Supervision/safety;Grab bars Toilet Transfer Details (indicate cue type and reason): use of grab bars Toileting- Clothing Manipulation and Hygiene: Contact guard assist;Sitting/lateral lean       Functional mobility during ADLs: Supervision/safety       Vision Baseline Vision/History: 1 Wears glasses Ability to See in Adequate Light: 1 Impaired Patient Visual Report: No change from  baseline Vision Assessment?: No apparent visual deficits;Wears glasses for reading     Perception Perception: Not tested       Praxis Praxis: Not tested       Pertinent Vitals/Pain Pain Assessment Pain Assessment: No/denies pain     Extremity/Trunk Assessment Upper Extremity Assessment Upper Extremity Assessment: Generalized weakness   Lower Extremity Assessment Lower Extremity Assessment: Defer to PT evaluation   Cervical / Trunk Assessment Cervical / Trunk Assessment: Kyphotic   Communication Communication Communication: No apparent difficulties   Cognition Arousal: Alert Behavior During Therapy: WFL for tasks assessed/performed Cognition: No apparent impairments                               Following commands: Intact       Cueing  General Comments   Cueing Techniques: Verbal cues;Tactile cues;Visual cues              Home Living Family/patient expects to be discharged to:: Private residence Living Arrangements: Children;Other relatives Available Help at Discharge: Family;Available 24 hours/day;Friend(s) Type of Home: House Home Access: Stairs to enter [employer] of Steps: 3 Entrance Stairs-Rails: Right;Left;Can reach both Home Layout: One level     Bathroom Shower/Tub: Chief Strategy Officer: Standard Bathroom Accessibility: Yes How Accessible: Accessible via wheelchair;Accessible via walker Home Equipment: Other (comment)   Additional Comments: information from recent admission, Lives with daughter-in-law  and grandson, also has friends who can be available to assist if needed      Prior Functioning/Environment Prior Level of Function : Independent/Modified Independent;Driving;History of Falls (last six months)             Mobility Comments: information from recent admission, Community ambulator without AD; drives; reported using walking stick as needed to help reach things and boost from a low  surfaces such as toilet reports no falls or instances since being discharged last ADLs Comments: Independent.    OT Problem List: Decreased strength;Decreased range of motion;Decreased activity tolerance;Impaired balance (sitting and/or standing);Pain   OT Treatment/Interventions: Self-care/ADL training;Therapeutic exercise;DME and/or AE instruction;Therapeutic activities;Patient/family education;Balance training      OT Goals(Current goals can be found in the care plan section)   Acute Rehab OT Goals Patient Stated Goal: to return home OT Goal Formulation: With patient Time For Goal Achievement: 04/18/24 Potential to Achieve Goals: Good ADL Goals Pt Will Perform Upper Body Bathing: Independently;sitting Pt Will Perform Lower Body Dressing: Independently;sitting/lateral leans Pt/caregiver will Perform Home Exercise Program: Both right and left upper extremity;Increased strength;With written HEP provided   OT Frequency:  Min 2X/week       AM-PAC OT 6 Clicks Daily Activity     Outcome Measure Help from another person eating meals?: None Help from another person taking care of personal grooming?: None Help from another person toileting, which includes using toliet, bedpan, or urinal?: A Little Help from another person bathing (including washing, rinsing, drying)?: A Little Help from another person to put on and taking off regular upper body clothing?: None Help from another person  to put on and taking off regular lower body clothing?: A Little 6 Click Score: 21   End of Session    Activity Tolerance: Patient tolerated treatment well Patient left: in bed;with call bell/phone within reach  OT Visit Diagnosis: Unsteadiness on feet (R26.81);Other abnormalities of gait and mobility (R26.89);Muscle weakness (generalized) (M62.81);History of falling (Z91.81)                Time: 8584-8557 OT Time Calculation (min): 27 min Charges:  OT Evaluation $OT Eval Low Complexity: 1  Low OT Treatments $Self Care/Home Management : 8-22 mins    Chiquita LOISE Sermon, OTR/L 04/04/2024, 3:21 PM

## 2024-04-04 NOTE — Hospital Course (Addendum)
 89 year old female with a history of HFpEF, paroxysmal atrial fibrillation, hypertension, hyperlipidemia, prediabetes presenting with worsening lower extremity edema and coughing.  The patient was recently admitted to the hospital from 03/24/2024 to 03/31/2024 for hyponatremia, acute HFpEF, and new onset atrial fibrillation with RVR.  She was discharged home with torsemide  30 mg daily, Cardizem  CD 120 milligrams daily, and metoprolol  12.5 mg twice daily. The patient returns because of worsening lower extremity edema and cough.  She has had some difficulty ambulating secondary to pain associated with swelling of her legs and feet.  She denies any fevers, chills, chest pain, shortness breath, hemoptysis, nausea, vomiting, diarrhea, abdominal pain.  In the ED, she was afebrile and hemodynamically stable.  Initially she was tachycardic in the 120s.  She was started on IV furosemide . Unfortunately, the patient remained fluid overloaded, and her atrial fibrillation continued with rapid response.  She was placed on IV diltiazem  drip.  She was transition to the stepdown unit.  Her furosemide  IV doses were escalated.  Unfortunately, the patient's sodium decreased down to 122.  Nephrology was consulted.  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  The patient was given tolvaptan  for treatment of SIADH.  Her furosemide  was increased to 80 mg IV twice daily.  She became euvolemic.  Her sodium improved and remained stable.  She was transition to oral torsemide .  She was transition to oral diltiazem .   Unfortunately, on the morning of 04/10/2024, the patient once again developed RVR. Due to repeated RVR episodes, cardiology was consulted to assist with management.  Patient sustained a fall in the evening around 04/08/24 due to orthostasis.  CT brain showed scalp hematoma.  Apixaban  was held temporarily.

## 2024-04-04 NOTE — Progress Notes (Signed)
 Mobility Specialist Progress Note:    04/04/24 1235  Mobility  Activity Ambulated with assistance  Level of Assistance Minimal assist, patient does 75% or more  Assistive Device [employer] Ambulated (ft) 40 ft  Range of Motion/Exercises Active;All extremities  Activity Response Tolerated well  Mobility Referral Yes  Mobility visit 1 Mobility  Mobility Specialist Start Time (ACUTE ONLY) 1235  Mobility Specialist Stop Time (ACUTE ONLY) 1255  Mobility Specialist Time Calculation (min) (ACUTE ONLY) 20 min   Pt received in chair, agreeable to mobility. Required MinA to stand and CGA to ambulate with straight cane. Tolerated well, asx throughout. Left in bed supine, all needs met.  Jonique Kulig Mobility Specialist Please contact via Special Educational Needs Teacher or  Rehab office at [phone]

## 2024-04-04 NOTE — Progress Notes (Addendum)
 Pt is active with [facility]/OT. Delon with [facility] aware of admission. Will need HHPT orders at d/c. PT evaluated pt and recommend continued HHPT. TOC will follow.    04/04/24 1242  TOC Brief Assessment  Insurance and Status Reviewed  Patient has primary care physician Yes  Home environment has been reviewed Lives with family.  Prior level of function: Fairly independent.  Prior/Current Home Services Current home services ([facility])  Social Drivers of Health Review SDOH reviewed no interventions necessary  Readmission risk has been reviewed Yes  Transition of care needs transition of care needs identified, TOC will continue to follow

## 2024-04-04 NOTE — Progress Notes (Signed)
 Responded to nursing call:  tachycardia   Subjective: Pt has a little sob.  Denies cp, n/v/d, abd pain  Vitals:   04/04/24 0414 04/04/24 0735 04/04/24 1259 04/04/24 1321  BP:   107/63   Pulse:   92   Resp:      Temp:   98.7 F (37.1 C)   TempSrc:   Oral   SpO2:  95% 95% 95%  Weight: 68.6 kg     Height:       CV--IRRR Lung--bilateral rales.  No wheeze Abd--soft+BS/NT   Assessment/Plan: Afib RVR -start diltiazem  drip -transfer to SDU -check COVID/Flu/RSV -check viral resp panel -optimize K and mag     Alm Schneider, DO [facility]

## 2024-04-04 NOTE — Plan of Care (Signed)
" °  Problem: Acute Rehab PT Goals(only PT should resolve) Goal: Pt Will Go Supine/Side To Sit Outcome: Progressing Flowsheets (Taken 04/04/2024 1241) Pt will go Supine/Side to Sit: with modified independence Goal: Patient Will Transfer Sit To/From Stand Outcome: Progressing Flowsheets (Taken 04/04/2024 1241) Patient will transfer sit to/from stand: with modified independence Goal: Pt Will Transfer Bed To Chair/Chair To Bed Outcome: Progressing Flowsheets (Taken 04/04/2024 1241) Pt will Transfer Bed to Chair/Chair to Bed: with modified independence Goal: Pt Will Ambulate Outcome: Progressing Flowsheets (Taken 04/04/2024 1241) Pt will Ambulate:  100 feet  with modified independence Goal: Pt Will Go Up/Down Stairs Outcome: Progressing Flowsheets (Taken 04/04/2024 1241) Pt will Go Up / Down Stairs:  3-5 stairs  with rail(s)  with supervision    12:42 PM, 04/04/2024 Rosaria Settler, PT, DPT [place] with [facility]  "

## 2024-04-05 ENCOUNTER — Inpatient Hospital Stay (HOSPITAL_COMMUNITY)

## 2024-04-05 DIAGNOSIS — E871 Hypo-osmolality and hyponatremia: Secondary | ICD-10-CM | POA: Diagnosis not present

## 2024-04-05 DIAGNOSIS — J111 Influenza due to unidentified influenza virus with other respiratory manifestations: Secondary | ICD-10-CM | POA: Diagnosis not present

## 2024-04-05 DIAGNOSIS — I5033 Acute on chronic diastolic (congestive) heart failure: Secondary | ICD-10-CM | POA: Diagnosis not present

## 2024-04-05 DIAGNOSIS — I4891 Unspecified atrial fibrillation: Secondary | ICD-10-CM | POA: Diagnosis not present

## 2024-04-05 LAB — RESPIRATORY PANEL BY PCR

## 2024-04-05 LAB — BASIC METABOLIC PANEL WITH GFR
Anion gap: 9 (ref 5–15)
Anion gap: 16 — ABNORMAL HIGH (ref 5–15)
Anion gap: 9 (ref 5–15)
BUN: 15 mg/dL (ref 8–23)
BUN: 14 mg/dL (ref 8–23)
BUN: 20 mg/dL (ref 8–23)
CO2: 31 mmol/L (ref 22–32)
CO2: 24 mmol/L (ref 22–32)
CO2: 28 mmol/L (ref 22–32)
Calcium: 8.4 mg/dL — ABNORMAL LOW (ref 8.9–10.3)
Calcium: 8.6 mg/dL — ABNORMAL LOW (ref 8.9–10.3)
Calcium: 8.3 mg/dL — ABNORMAL LOW (ref 8.9–10.3)
Chloride: 80 mmol/L — ABNORMAL LOW (ref 98–111)
Chloride: 85 mmol/L — ABNORMAL LOW (ref 98–111)
Chloride: 84 mmol/L — ABNORMAL LOW (ref 98–111)
Creatinine, Ser: 0.79 mg/dL (ref 0.44–1.00)
Creatinine, Ser: 0.84 mg/dL (ref 0.44–1.00)
Creatinine, Ser: 0.86 mg/dL (ref 0.44–1.00)
GFR, Estimated: >60 mL/min
GFR, Estimated: >60 mL/min
GFR, Estimated: >60 mL/min
Glucose, Bld: 116 mg/dL — ABNORMAL HIGH (ref 70–99)
Glucose, Bld: 137 mg/dL — ABNORMAL HIGH (ref 70–99)
Glucose, Bld: 107 mg/dL — ABNORMAL HIGH (ref 70–99)
Potassium: 3.3 mmol/L — ABNORMAL LOW (ref 3.5–5.1)
Potassium: 3.3 mmol/L — ABNORMAL LOW (ref 3.5–5.1)
Potassium: 4.4 mmol/L (ref 3.5–5.1)
Sodium: 119 mmol/L — CL (ref 135–145)
Sodium: 125 mmol/L — ABNORMAL LOW (ref 135–145)
Sodium: 122 mmol/L — ABNORMAL LOW (ref 135–145)

## 2024-04-05 LAB — OSMOLALITY, URINE: Osmolality, Ur: 581 mosm/kg (ref 300–900)

## 2024-04-05 LAB — MAGNESIUM: Magnesium: 2.1 mg/dL (ref 1.7–2.4)

## 2024-04-05 LAB — SODIUM, URINE, RANDOM: Sodium, Ur: 69 mmol/L

## 2024-04-05 LAB — PROCALCITONIN: Procalcitonin: 0.14 ng/mL

## 2024-04-05 LAB — OSMOLALITY: Osmolality: 266 mosm/kg — ABNORMAL LOW (ref 275–295)

## 2024-04-05 MED ORDER — FUROSEMIDE 10 MG/ML IJ SOLN
80.0000 mg | Freq: Two times a day (BID) | INTRAMUSCULAR | Status: DC
  Administered 2024-04-06 – 2024-04-07 (×4): 80 mg via INTRAVENOUS
  Filled 2024-04-05 (×4): qty 8

## 2024-04-05 MED ORDER — OSELTAMIVIR PHOSPHATE 30 MG PO CAPS
30.0000 mg | ORAL_CAPSULE | Freq: Two times a day (BID) | ORAL | Status: AC
  Administered 2024-04-05 – 2024-04-09 (×10): 30 mg via ORAL
  Filled 2024-04-05 (×10): qty 1

## 2024-04-05 MED ORDER — POTASSIUM CHLORIDE CRYS ER 20 MEQ PO TBCR: 40.0000 meq | EXTENDED_RELEASE_TABLET | Freq: Once | ORAL | Status: DC

## 2024-04-05 MED ORDER — DILTIAZEM HCL 30 MG PO TABS
30.0000 mg | ORAL_TABLET | Freq: Four times a day (QID) | ORAL | Status: DC
  Administered 2024-04-05 – 2024-04-08 (×13): 30 mg via ORAL
  Filled 2024-04-05 (×13): qty 1

## 2024-04-05 MED ORDER — POTASSIUM CHLORIDE CRYS ER 20 MEQ PO TBCR
40.0000 meq | EXTENDED_RELEASE_TABLET | Freq: Two times a day (BID) | ORAL | Status: AC
  Administered 2024-04-05 (×2): 40 meq via ORAL
  Filled 2024-04-05 (×2): qty 2

## 2024-04-05 MED ORDER — POTASSIUM CHLORIDE CRYS ER 20 MEQ PO TBCR
40.0000 meq | EXTENDED_RELEASE_TABLET | ORAL | Status: AC
  Administered 2024-04-05: 40 meq via ORAL
  Filled 2024-04-05: qty 2

## 2024-04-05 NOTE — Progress Notes (Signed)
" °   04/05/24 1444  Spiritual Encounters  Type of Visit Initial  Care provided to: Patient  Referral source Nurse (RN/NT/LPN)  Reason for visit Advance directives  OnCall Visit No   Reason for Visit: Chaplain responding to spiritual consult which stated that Pt was interested in receiving Advance Care Directive paperwork and education.  Description of Visit: Upon entering the room I found Pt in the recliner and there was no family there to act as a her support person.    I introduced myself as the chaplain and stated my purpose to deliver the ACD paperwork and education. The Pt was open to receiving the the information.  I delivered the education and made clear the path forward to completing the documentation.  The Pt was made aware of how to reach out to Spiritual Care with questions and concerns and the Chaplain exited the room.  Plan of Care: No further spiritual care interventions are planned at this time, unless the Pt reaches out.  Spiritual Care remains available upon request.  Maude Roll, MDiv Chaplain, [facility] [email or web address] [phone] 04/05/2024 2:45 PM   "

## 2024-04-05 NOTE — Progress Notes (Signed)
 "          PROGRESS NOTE  Glenda Nguyen FMW:[id number] DOB: 04-Jun-1934 DOA: 04/03/2024 PCP: Vick Lurie, FNP (Inactive)  Brief History:  89 year old female with a history of HFpEF, paroxysmal atrial fibrillation, hypertension, hyperlipidemia, prediabetes presenting with worsening lower extremity edema and coughing.  The patient was recently admitted to the hospital from 03/24/2024 to 03/31/2024 for hyponatremia, acute HFpEF, and new onset atrial fibrillation with RVR.  She was discharged home with torsemide  30 mg daily, Cardizem  CD 120 milligrams daily, and metoprolol  12.5 mg twice daily. The patient returns because of worsening lower extremity edema and cough.  She has had some difficulty ambulating secondary to pain associated with swelling of her legs and feet.  She denies any fevers, chills, chest pain, shortness breath, hemoptysis, nausea, vomiting, diarrhea, abdominal pain.  In the ED, she was afebrile and hemodynamically stable.  Initially she was tachycardic in the 120s.  She was started on IV furosemide .   Assessment/Plan: Acute on chronic HFpEF - 04/03/2024 CTA chest--negative PE, trace bilateral pleural effusions, left greater than right, biapical scarring,RUL calcified granuloma - 03/25/2024 echo EF 65 to 70%, no WMA, normal RVF - Continue lasix  to 40 mg IV bid -still has some signs of fluid overload, +JVD   Persistent atrial fibrillation with RVR - Continue apixaban  - 04/04/24--developed RVR>>started IV diltiazem  - 04/05/24--transition back to po diltiazem  - 03/25/24 Echo EF 65-70%, no WMA, normal RVF  Hyponatremia -04/05/24--Na down to 119 from 127 -required tolvaptan  last hospitalization -reconsult nephrology -check serum Osm -urine Osm -urine Na  Influenza Bronchitis -repeat CXR -PCT 0.14   Elevated troponin - Secondary to demand ischemia - No chest pain presently - Personally reviewed EKG--atrial fibrillation with nonspecific T changes   Hypervolemic  hyponatremia - Secondary to fluid overload - Anticipate improvement with diuresis - Monitor BMP   Large right hilar lymph node measuring 14 mm -Possibly reactive, will need to be monitored - Outpatient surveillance and possible PET scan   Generalized weakness PT OT evaluation Fall precautions.   anion gap metabolic acidosis Serum bicarb 20, anion gap 17 on day of admission -improved   Hypokalemia - Replete - Check magnesium  2.1                 Family Communication: no  Family at bedside   Consultants:  none   Code Status:  FULL    DVT Prophylaxis:  apixaban      Procedures: As Listed in Progress Note Above   Antibiotics: Ceftriaxone  1/7>>1/8 Azithro 1/7       Subjective: Pt has chest congestion and dry cough.  Denies sob, n/v/d, cp, abd pain.    Objective: Vitals:   04/05/24 0500 04/05/24 0530 04/05/24 0600 04/05/24 0630  BP: 116/64 (!) 83/47 (!) 117/59 97/61  Pulse: 87 70 76 85  Resp:      Temp:      TempSrc:      SpO2: 100% 99% 99% 99%  Weight:      Height:        Intake/Output Summary (Last 24 hours) at 04/05/2024 0723 Last data filed at 04/05/2024 0500 Gross per 24 hour  Intake 321 ml  Output 1200 ml  Net -879 ml   Weight change: -2 kg Exam:  General:  Pt is alert, follows commands appropriately, not in acute distress HEENT: No icterus, No thrush, No neck mass, Cricket/AT Cardiovascular: RRR, S1/S2, no rubs, no gallops +JVD Respiratory: bilateral rales.  Bibasilar wheeze Abdomen: Soft/+BS, non tender,  non distended, no guarding Extremities: 1 +LE edema, No lymphangitis, No petechiae, No rashes, no synovitis   Data Reviewed: I have personally reviewed following labs and imaging studies Basic Metabolic Panel: Recent Labs  Lab 03/30/24 0131 03/30/24 0748 03/30/24 1537 03/31/24 0402 04/03/24 1232 04/04/24 0409 04/05/24 0429  NA 129*   < > 130* 129* 127* 127* 119*  K 4.6  --   --  4.4 3.6 3.2* 3.3*  CL 94*  --   --  93* 90* 86* 80*   CO2 31  --   --  33* 20* 33* 31  GLUCOSE 96  --   --  85 99 95 116*  BUN 14  --   --  15 19 16 15   CREATININE 0.61  --   --  0.71 0.70 0.75 0.79  CALCIUM 7.2*  --   --  8.3* 8.4* 8.1* 8.4*  MG  --   --   --   --   --  2.0 2.1  PHOS  --   --   --   --   --  3.9  --    < > = values in this interval not displayed.   Liver Function Tests: No results for input(s): AST, ALT, ALKPHOS, BILITOT, PROT, ALBUMIN in the last 168 hours. No results for input(s): LIPASE, AMYLASE in the last 168 hours. No results for input(s): AMMONIA in the last 168 hours. Coagulation Profile: No results for input(s): INR, PROTIME in the last 168 hours. CBC: Recent Labs  Lab 04/03/24 1232 04/04/24 0409  WBC 5.8 5.1  NEUTROABS 4.9  --   HGB 10.0* 9.7*  HCT 29.2* 27.8*  MCV 90.1 88.3  PLT 368 362   Cardiac Enzymes: No results for input(s): CKTOTAL, CKMB, CKMBINDEX, TROPONINI in the last 168 hours. BNP: Invalid input(s): POCBNP CBG: No results for input(s): GLUCAP in the last 168 hours. HbA1C: No results for input(s): HGBA1C in the last 72 hours. Urine analysis:    Component Value Date/Time   COLORURINE YELLOW 03/24/2024 1038   APPEARANCEUR CLEAR 03/24/2024 1038   LABSPEC 1.020 03/24/2024 1038   PHURINE 6.0 03/24/2024 1038   GLUCOSEU NEGATIVE 03/24/2024 1038   HGBUR MODERATE (A) 03/24/2024 1038   BILIRUBINUR NEGATIVE 03/24/2024 1038   KETONESUR 5 (A) 03/24/2024 1038   PROTEINUR 30 (A) 03/24/2024 1038   NITRITE NEGATIVE 03/24/2024 1038   LEUKOCYTESUR NEGATIVE 03/24/2024 1038   Sepsis Labs: @LABRCNTIP (procalcitonin:4,lacticidven:4) ) Recent Results (from the past 240 hours)  Expectorated Sputum Assessment w Gram Stain, Rflx to Resp Cult     Status: None   Collection Time: 04/04/24  4:30 PM   Specimen: Sputum  Result Value Ref Range Status   Specimen Description SPUTUM  Final   Special Requests Normal  Final   Sputum evaluation   Final    THIS SPECIMEN IS  ACCEPTABLE FOR SPUTUM CULTURE Performed at [facility], [address], [place], [place] [address]    Report Status 04/04/2024 FINAL  Final  Culture, Respiratory w Gram Stain     Status: None (Preliminary result)   Collection Time: 04/04/24  4:30 PM   Specimen: SPU  Result Value Ref Range Status   Specimen Description   Final    SPUTUM Performed at [facility], [address], [place], [place] [address]    Special Requests   Final    Normal Reflexed from [id number] Performed at [facility], [address], [place], [place] [address]    Gram Stain  Final    ABUNDANT SQUAMOUS EPITHELIAL CELLS PRESENT NO WBC SEEN FEW GRAM POSITIVE RODS RARE YEAST RARE GRAM POSITIVE COCCI RARE GRAM NEGATIVE RODS Performed at [facility], [address] [place], [place] [address]    Culture PENDING  Incomplete   Report Status PENDING  Incomplete  Resp panel by RT-PCR (RSV, Flu A&B, Covid) Nasal Mucosa     Status: Abnormal   Collection Time: 04/04/24  6:34 PM   Specimen: Nasal Mucosa; Nasal Swab  Result Value Ref Range Status   SARS Coronavirus 2 by RT PCR NEGATIVE NEGATIVE Final    Comment: (NOTE) SARS-CoV-2 target nucleic acids are NOT DETECTED.  The SARS-CoV-2 RNA is generally detectable in upper respiratory specimens during the acute phase of infection. The lowest concentration of SARS-CoV-2 viral copies this assay can detect is 138 copies/mL. A negative result does not preclude SARS-Cov-2 infection and should not be used as the sole basis for treatment or other patient management decisions. A negative result may occur with  improper specimen collection/handling, submission of specimen other than nasopharyngeal swab, presence of viral mutation(s) within the areas targeted by this assay, and inadequate number of viral copies(<138 copies/mL). A negative result must be combined with clinical observations, patient history, and epidemiological information. The expected result is  Negative.  Fact Sheet for Patients:  bloggercourse.com  Fact Sheet for Healthcare Providers:  seriousbroker.it  This test is no t yet approved or cleared by the [place]  FDA and  has been authorized for detection and/or diagnosis of SARS-CoV-2 by FDA under an Emergency Use Authorization (EUA). This EUA will remain  in effect (meaning this test can be used) for the duration of the COVID-19 declaration under Section 564(b)(1) of the Act, 21 U.S.C.section 360bbb-3(b)(1), unless the authorization is terminated  or revoked sooner.       Influenza A by PCR POSITIVE (A) NEGATIVE Final   Influenza B by PCR NEGATIVE NEGATIVE Final    Comment: (NOTE) The Xpert Xpress SARS-CoV-2/FLU/RSV plus assay is intended as an aid in the diagnosis of influenza from Nasopharyngeal swab specimens and should not be used as a sole basis for treatment. Nasal washings and aspirates are unacceptable for Xpert Xpress SARS-CoV-2/FLU/RSV testing.  Fact Sheet for Patients: bloggercourse.com  Fact Sheet for Healthcare Providers: seriousbroker.it  This test is not yet approved or cleared by the [place]  FDA and has been authorized for detection and/or diagnosis of SARS-CoV-2 by FDA under an Emergency Use Authorization (EUA). This EUA will remain in effect (meaning this test can be used) for the duration of the COVID-19 declaration under Section 564(b)(1) of the Act, 21 U.S.C. section 360bbb-3(b)(1), unless the authorization is terminated or revoked.     Resp Syncytial Virus by PCR NEGATIVE NEGATIVE Final    Comment: (NOTE) Fact Sheet for Patients: bloggercourse.com  Fact Sheet for Healthcare Providers: seriousbroker.it  This test is not yet approved or cleared by the [place]  FDA and has been authorized for detection and/or diagnosis of  SARS-CoV-2 by FDA under an Emergency Use Authorization (EUA). This EUA will remain in effect (meaning this test can be used) for the duration of the COVID-19 declaration under Section 564(b)(1) of the Act, 21 U.S.C. section 360bbb-3(b)(1), unless the authorization is terminated or revoked.  Performed at [facility], [address], [place], [place] [address]   MRSA Next Gen by PCR, Nasal     Status: None   Collection Time: 04/04/24  6:34 PM   Specimen: Nasal Mucosa; Nasal  Swab  Result Value Ref Range Status   MRSA by PCR Next Gen NOT DETECTED NOT DETECTED Final    Comment: (NOTE) The GeneXpert MRSA Assay (FDA approved for NASAL specimens only), is one component of a comprehensive MRSA colonization surveillance program. It is not intended to diagnose MRSA infection nor to guide or monitor treatment for MRSA infections. Test performance is not FDA approved in patients less than 73 years old. Performed at [facility], [address] [place], [place] [address]      Scheduled Meds:  apixaban   5 mg Oral BID   azithromycin   250 mg Oral Daily   Chlorhexidine  Gluconate Cloth  6 each Topical Daily   furosemide   40 mg Intravenous BID   guaiFENesin   600 mg Oral BID   Influenza vac split trivalent PF  0.5 mL Intramuscular Tomorrow-1000   ipratropium-albuterol   3 mL Nebulization Q6H   potassium chloride   40 mEq Oral Once   Continuous Infusions:  cefTRIAXone  (ROCEPHIN )  IV 200 mL/hr at 04/04/24 0651   diltiazem  (CARDIZEM ) infusion 5 mg/hr (04/05/24 0402)    Procedures/Studies: CT Angio Chest PE W and/or Wo Contrast Result Date: 04/03/2024 EXAM: CTA CHEST 04/03/2024 06:44:08 PM TECHNIQUE: CTA of the chest was performed without and with the administration of 75 mL of iohexol  (OMNIPAQUE ) 350 MG/ML injection. Multiplanar reformatted images are provided for review. MIP images are provided for review. Automated exposure control, iterative reconstruction, and/or weight based adjustment of the  mA/kV was utilized to reduce the radiation dose to as low as reasonably achievable. COMPARISON: None available. CLINICAL HISTORY: Pulmonary embolism (PE) suspected, high prob. FINDINGS: PULMONARY ARTERIES: Pulmonary arteries are adequately opacified for evaluation. No acute pulmonary embolus. Main pulmonary artery is normal in caliber. MEDIASTINUM: The heart and pericardium demonstrate no acute abnormality. There are atherosclerotic calcifications of the aorta. LYMPH NODES: Large right hilar lymph node measuring 14 mm. No mediastinal or axillary lymphadenopathy. LUNGS AND PLEURA: There is a trace of bilateral pleural effusions, left greater than right. No pneumothorax. There is atelectasis in the lingula. There is some scarring in both lung apices. There are calcified granulomas in the right upper lobe. The lungs are otherwise clear. UPPER ABDOMEN: Limited images of the upper abdomen are unremarkable. SOFT TISSUES AND BONES: No acute bone or soft tissue abnormality. IMPRESSION: 1. No pulmonary embolism. 2. Large right hilar lymph node measuring 14 mm. 3. Trace bilateral pleural effusions, left greater than right. Electronically signed by: Greig Pique MD 04/03/2024 07:28 PM EST RP Workstation: [id number]   DG Foot Complete Left Result Date: 04/03/2024 CLINICAL DATA:  Bruising and edema of left foot. EXAM: LEFT FOOT - COMPLETE 3+ VIEW COMPARISON:  None Available. FINDINGS: There is no evidence of acute fracture or dislocation. There is no evidence of arthropathy or other focal bone abnormality. Soft tissues are unremarkable. IMPRESSION: Negative. Electronically Signed   By: Marcey Moan M.D.   On: 04/03/2024 16:45   DG Chest 2 View Result Date: 04/03/2024 CLINICAL DATA:  Lower extremity swelling. EXAM: CHEST - 2 VIEW COMPARISON:  March 24, 2024 FINDINGS: The cardiac silhouette is mildly enlarged and unchanged in size. Mild, chronic appearing increased interstitial lung markings are seen without evidence of  focal consolidation or pneumothorax. A small left pleural effusion is suspected. A deformity of indeterminate age is seen along the distal right clavicle. Multilevel degenerative changes are present throughout the thoracic spine. IMPRESSION: 1. Chronic appearing increased interstitial lung markings without evidence of acute or active cardiopulmonary disease. 2. Small  left pleural effusion. 3. Deformity of indeterminate age along the distal right clavicle. Correlation with physical examination is recommended to determine the presence of point tenderness. Electronically Signed   By: Suzen Dials M.D.   On: 04/03/2024 13:11   US  Venous Img Upper Uni Left (DVT) Result Date: 03/27/2024 CLINICAL DATA:  Left upper extremity edema EXAM: LEFT UPPER EXTREMITY VENOUS DOPPLER ULTRASOUND TECHNIQUE: Gray-scale sonography with graded compression, as well as color Doppler and duplex ultrasound were performed to evaluate the upper extremity deep venous system from the level of the subclavian vein and including the jugular, axillary, basilic, radial, ulnar and upper cephalic vein. Spectral Doppler was utilized to evaluate flow at rest and with distal augmentation maneuvers. COMPARISON:  None Available. FINDINGS: Contralateral Subclavian Vein: Respiratory phasicity is normal and symmetric with the symptomatic side. No evidence of thrombus. Normal compressibility. Internal Jugular Vein: No evidence of thrombus. Normal compressibility, respiratory phasicity and response to augmentation. Subclavian Vein: No evidence of thrombus. Normal compressibility, respiratory phasicity and response to augmentation. Axillary Vein: No evidence of thrombus. Normal compressibility, respiratory phasicity and response to augmentation. Cephalic Vein: Left cephalic vein at the antecubital fossa demonstrates ill-defined hypoechoic intraluminal thrombus and is noncompressible compatible with superficial thrombosis/thrombophlebitis. Basilic Vein: No  evidence of thrombus. Normal compressibility, respiratory phasicity and response to augmentation. Brachial Veins: No evidence of thrombus. Normal compressibility, respiratory phasicity and response to augmentation. Radial Veins: No evidence of thrombus. Normal compressibility, respiratory phasicity and response to augmentation. Ulnar Veins: No evidence of thrombus. Normal compressibility, respiratory phasicity and response to augmentation. IMPRESSION: 1. Negative for left upper extremity DVT. 2. Positive for left cephalic vein superficial thrombosis/thrombophlebitis at the antecubital fossa. Electronically Signed   By: CHRISTELLA.  Shick M.D.   On: 03/27/2024 10:30   ECHOCARDIOGRAM COMPLETE Result Date: 03/25/2024    ECHOCARDIOGRAM REPORT   Patient Name:   ALLESANDRA HUEBSCH Date of Exam: 03/25/2024 Medical Rec #:  [id number]           Height:       67.0 in Accession #:    [id number]          Weight:       188.3 lb Date of Birth:  18-May-1934           BSA:          1.971 m Patient Age:    89 years            BP:           126/57 mmHg Patient Gender: F                   HR:           102 bpm. Exam Location:  Inpatient Procedure: 2D Echo, Cardiac Doppler and Color Doppler (Both Spectral and Color            Flow Doppler were utilized during procedure). Indications:    Atrial fibrillation  History:        Patient has no prior history of Echocardiogram examinations.                 Arrythmias:Atrial Fibrillation; Risk Factors:Hypertension.  Sonographer:    Philomena Daring Referring Phys: [email or web address] [place] IMPRESSIONS  1. Left ventricular ejection fraction, by estimation, is 65 to 70%. The left ventricle has normal function. The left ventricle has no regional wall motion abnormalities. There is mild concentric left ventricular hypertrophy. Left ventricular diastolic parameters are indeterminate.  2. Right ventricular  systolic function is normal. The right ventricular size is normal.  3. The mitral valve is normal in structure. No  evidence of mitral valve regurgitation. No evidence of mitral stenosis.  4. The aortic valve was not well visualized. There is mild calcification of the aortic valve. Aortic valve regurgitation is mild. Aortic valve sclerosis is present, with no evidence of aortic valve stenosis.  5. The inferior vena cava is normal in size with greater than 50% respiratory variability, suggesting right atrial pressure of 3 mmHg. Comparison(s): No prior Echocardiogram. FINDINGS  Left Ventricle: Left ventricular ejection fraction, by estimation, is 65 to 70%. The left ventricle has normal function. The left ventricle has no regional wall motion abnormalities. The left ventricular internal cavity size was normal in size. There is  mild concentric left ventricular hypertrophy. Left ventricular diastolic parameters are indeterminate. Right Ventricle: The right ventricular size is normal. No increase in right ventricular wall thickness. Right ventricular systolic function is normal. Left Atrium: Left atrial size was normal in size. Right Atrium: Right atrial size was normal in size. Pericardium: There is no evidence of pericardial effusion. Mitral Valve: The mitral valve is normal in structure. No evidence of mitral valve regurgitation. No evidence of mitral valve stenosis. Tricuspid Valve: The tricuspid valve is normal in structure. Tricuspid valve regurgitation is mild . No evidence of tricuspid stenosis. Aortic Valve: The aortic valve was not well visualized. There is mild calcification of the aortic valve. Aortic valve regurgitation is mild. Aortic regurgitation PHT measures 465 msec. Aortic valve sclerosis is present, with no evidence of aortic valve stenosis. Pulmonic Valve: The pulmonic valve was normal in structure. Pulmonic valve regurgitation is mild. No evidence of pulmonic stenosis. Aorta: The aortic root and ascending aorta are structurally normal, with no evidence of dilitation. Venous: The inferior vena cava is normal in  size with greater than 50% respiratory variability, suggesting right atrial pressure of 3 mmHg. IAS/Shunts: No atrial level shunt detected by color flow Doppler.  LEFT VENTRICLE PLAX 2D LVIDd:         3.90 cm   Diastology LVIDs:         2.60 cm   LV e' medial:    10.23 cm/s LV PW:         1.10 cm   LV E/e' medial:  9.5 LV IVS:        1.10 cm   LV e' lateral:   14.80 cm/s LVOT diam:     1.80 cm   LV E/e' lateral: 6.5 LV SV:         42 LV SV Index:   22 LVOT Area:     2.54 cm  RIGHT VENTRICLE             IVC RV Basal diam:  2.90 cm     IVC diam: 1.40 cm RV Mid diam:    2.20 cm RV S prime:     14.00 cm/s TAPSE (M-mode): 2.2 cm LEFT ATRIUM             Index        RIGHT ATRIUM           Index LA diam:        3.30 cm 1.67 cm/m   RA Area:     15.70 cm LA Vol (A2C):   46.8 ml 23.74 ml/m  RA Volume:   37.00 ml  18.77 ml/m LA Vol (A4C):   42.7 ml 21.66 ml/m LA Biplane Vol: 48.7 ml  24.71 ml/m  AORTIC VALVE LVOT Vmax:   129.33 cm/s LVOT Vmean:  83.367 cm/s LVOT VTI:    0.167 m AI PHT:      465 msec  AORTA Ao Root diam: 3.20 cm Ao Asc diam:  3.20 cm MITRAL VALVE               TRICUSPID VALVE MV Area (PHT): 4.67 cm    TR Peak grad:   30.9 mmHg MV Decel Time: 163 msec    TR Vmax:        278.00 cm/s MV E velocity: 96.90 cm/s                            SHUNTS                            Systemic VTI:  0.17 m                            Systemic Diam: 1.80 cm Stanly Leavens MD Electronically signed by Stanly Leavens MD Signature Date/Time: 03/25/2024/12:13:57 PM    Final    DG Chest 1 View Result Date: 03/24/2024 CLINICAL DATA:  Weakness and fall. EXAM: CHEST  1 VIEW COMPARISON:  None available. FINDINGS: The heart is mildly enlarged. No pulmonary vascular congestion. Mediastinal silhouette within normal limits. Lungs are clear. Deformity of the lateral RIGHT clavicle suspicious for fracture. IMPRESSION: 1. Mild cardiomegaly. 2. Deformity of the lateral RIGHT clavicle suspicious for fracture. Please correlate  for focal tenderness. Electronically Signed   By: Aliene Lloyd M.D.   On: 03/24/2024 14:04   DG Knee Complete 4 Views Right Result Date: 03/24/2024 EXAM: 4 VIEW(S) XRAY OF THE KNEE 03/24/2024 11:07:00 AM COMPARISON: None available. CLINICAL HISTORY: fall FINDINGS: BONES AND JOINTS: No acute fracture. No malalignment. No significant joint effusion. Mild lateral compartment joint space narrowing. Marginal osteophytosis and subchondral sclerosis of the lateral compartment. Additional subtle marginal osteophytosis of the medial compartment. SOFT TISSUES: Mild diffuse soft tissue swelling. IMPRESSION: 1. No acute fracture or dislocation. 2. Mild diffuse soft tissue swelling. 3. Lateral compartment predominant osteoarthritis. Electronically signed by: Donnice Mania MD 03/24/2024 11:27 AM EST RP Workstation: [id number]   CT Cervical Spine Wo Contrast Result Date: 03/24/2024 EXAM: CT CERVICAL SPINE WITHOUT CONTRAST 03/24/2024 10:59:54 AM TECHNIQUE: CT of the cervical spine was performed without the administration of intravenous contrast. Multiplanar reformatted images are provided for review. Automated exposure control, iterative reconstruction, and/or weight based adjustment of the mA/kV was utilized to reduce the radiation dose to as low as reasonably achievable. COMPARISON: None available. CLINICAL HISTORY: Neck trauma (Age >= 65y) FINDINGS: BONES AND ALIGNMENT: Straightening of the normal cervical lordosis. Trace degenerative anterolisthesis of C2 on C3 and C3 on C4. Additional trace degenerative anterolisthesis of C7 on T1. No acute fracture or traumatic malalignment. DEGENERATIVE CHANGES: Moderate disc space narrowing at C4-C5. Additional disc space narrowing at C5-C6 and C6-C7. Mild degenerative endplate osteophytes at multiple levels. There are disc osteophyte complexes at multiple levels without evidence of high grade osseous spinal canal stenosis. Facet arthrosis and uncovertebral hypertrophy at multiple  levels. SOFT TISSUES: No prevertebral soft tissue swelling. LUNGS: Scarring and atelectasis in the right lung apex. IMPRESSION: 1. No evidence of acute traumatic injury. 2. Degenerative changes as above. Electronically signed by: Donnice Mania MD 03/24/2024 11:25 AM EST RP Workstation: [id number]  CT Head Wo Contrast Result Date: 03/24/2024 EXAM: CT HEAD WITHOUT CONTRAST 03/24/2024 10:59:54 AM TECHNIQUE: CT of the head was performed without the administration of intravenous contrast. Automated exposure control, iterative reconstruction, and/or weight based adjustment of the mA/kV was utilized to reduce the radiation dose to as low as reasonably achievable. COMPARISON: None available. CLINICAL HISTORY: Head trauma, minor (Age >= 65y). FINDINGS: BRAIN AND VENTRICLES: No acute hemorrhage. 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 Mild chronic microvascular ischemic change. No evidence of acute infarct. No hydrocephalus. No extra-axial collection. No mass effect or midline shift. ORBITS: Bilateral lens replacement noted. SINUSES: Trace right mastoid effusion. SOFT TISSUES AND SKULL: Right periorbital soft tissue swelling. No skull fracture. IMPRESSION: 1. No acute intracranial abnormality. 2. Right periorbital soft tissue swelling. 3. Focal encephalomalacia along the anterolateral left temporal lobe, suggestive of small remote infarct or sequelae of prior trauma. Electronically signed by: Donnice Mania MD 03/24/2024 11:18 AM EST RP Workstation: [id number]    Alm Schneider, DO  [facility]  If 7PM-7AM, please contact night-coverage [email or web address] Password [email or web address] 04/05/2024, 7:23 AM   LOS: 2 days   "

## 2024-04-05 NOTE — Progress Notes (Signed)
 PT Cancellation Note  Patient Details Name: Glenda Nguyen MRN: [id number] DOB: January 16, 1935   Cancelled Treatment:    Reason Eval/Treat Not Completed: Medical issues which prohibited therapy Patient transferred to a higher level of care and will need new PT consult to resume therapy when patient is medically stable.  Thank you.    8:19 AM, 04/05/2024 Lynwood Music, MPT Physical Therapist with [facility] [phone] office [phone] mobile phone

## 2024-04-05 NOTE — Plan of Care (Signed)

## 2024-04-05 NOTE — Progress Notes (Signed)
 OT Cancellation Note  Patient Details Name: Glenda Nguyen MRN: [id number] DOB: 1934/09/29   Cancelled Treatment:    Reason Eval/Treat Not Completed: Patient not medically ready;Other (comment). Pt moved to higher level of care and will need a new OT consult to resume therapy. Thank you.   Diella Gillingham OT, MOT   Jayson Person 04/05/2024, 7:53 AM

## 2024-04-05 NOTE — Progress Notes (Addendum)
 [facility] NEPHROLOGY PROGRESS NOTE  Assessment/ Plan: Pt is a 89 y.o. yo female with past medical history significant for hypertension, dyslipidemia, prediabetes, A-fib, CHF with preserved EF was recently hospitalized from 12/27 - 03/31/2024 for hyponatremia, CHF and A-fib with RVR presents now with worsening lower extremity edema and coughing. Our team saw the patient for hyponatremia which was treated with diuretics and a dose of tolvaptan  with improvement of sodium to 130.  Now we are reconsulted for worsening hyponatremia.  # Acute on chronic hyponatremia likely due to fluid overload/CHF exacerbation.  Patient has elevated BNP and physical exam consistent with volume up.  Sodium is 119 today however she is mentating well and denies any symptoms.  She may also have underlying SIADH with flu/respiratory infection. -I am going to repeat urine sodium, urine osmolality and a repeat BMP since patient already got 2 doses of IV Lasix .  Depending on the repeat BMP, we may consider a dose of tolvaptan .  Continue to replete potassium chloride .  Continue fluid restriction.  No need for 3% saline at this time.  # Hypokalemia: Continue to replete potassium chloride .  Magnesium  level normal.  Follow lab.  # Acute on chronic CHF with preserved EF:, Continue IV diuretics and follow lab.  # Influenza/acute bronchitis: Supportive treatment per primary team.  # HTN/volume: Monitor BP, managing volume with diuretics.  Discussed with the primary team and nursing staff.  Subjective: Seen and examined in ICU.  Patient stated she is feeling weak but much better.  Denies headache, dizziness, nausea, vomiting, chest pain or shortness of breath.  She has been coughing.  Using around 2 L of oxygen. Objective Vital signs in last 24 hours: Vitals:   04/05/24 0700 04/05/24 0730 04/05/24 0745 04/05/24 0820  BP: 115/64 138/75    Pulse: 85 (!) 112 (!) 115   Resp:  20 (!) 33   Temp:      TempSrc:       SpO2: 99% 99% 99% 100%  Weight:      Height:       Weight change: -2 kg  Intake/Output Summary (Last 24 hours) at 04/05/2024 0911 Last data filed at 04/05/2024 0835 Gross per 24 hour  Intake 191.04 ml  Output 1000 ml  Net -808.96 ml       Labs: RENAL PANEL Recent Labs  Lab 03/30/24 0131 03/30/24 0748 03/30/24 1537 03/31/24 0402 04/03/24 1232 04/04/24 0409 04/05/24 0429  NA 129*   < > 130* 129* 127* 127* 119*  K 4.6  --   --  4.4 3.6 3.2* 3.3*  CL 94*  --   --  93* 90* 86* 80*  CO2 31  --   --  33* 20* 33* 31  GLUCOSE 96  --   --  85 99 95 116*  BUN 14  --   --  15 19 16 15   CREATININE 0.61  --   --  0.71 0.70 0.75 0.79  CALCIUM 7.2*  --   --  8.3* 8.4* 8.1* 8.4*  MG  --   --   --   --   --  2.0 2.1  PHOS  --   --   --   --   --  3.9  --    < > = values in this interval not displayed.    Liver Function Tests: No results for input(s): AST, ALT, ALKPHOS, BILITOT, PROT, ALBUMIN in the last 168 hours. No results for input(s): LIPASE, AMYLASE in  the last 168 hours. No results for input(s): AMMONIA in the last 168 hours. CBC: Recent Labs    03/24/24 1110 03/24/24 1118 03/24/24 1723 03/25/24 0552 03/25/24 1118 04/03/24 1232 04/04/24 0409  HGB 9.9*  --   --  10.0*  --  10.0* 9.7*  MCV 85.4  --   --  86.1  --  90.1 88.3  VITAMINB12  --   --  1,127*  --  1,182*  --   --   FOLATE  --  7.6 11.6  --   --   --   --   FERRITIN  --   --   --   --  301  --   --   TIBC  --   --   --   --  248*  --   --   IRON  --   --   --   --  16*  --   --     Cardiac Enzymes: No results for input(s): CKTOTAL, CKMB, CKMBINDEX, TROPONINI in the last 168 hours. CBG: No results for input(s): GLUCAP in the last 168 hours.  Iron Studies: No results for input(s): IRON, TIBC, TRANSFERRIN, FERRITIN in the last 72 hours. Studies/Results: CT Angio Chest PE W and/or Wo Contrast Result Date: 04/03/2024 EXAM: CTA CHEST 04/03/2024 06:44:08 PM TECHNIQUE: CTA of  the chest was performed without and with the administration of 75 mL of iohexol  (OMNIPAQUE ) 350 MG/ML injection. Multiplanar reformatted images are provided for review. MIP images are provided for review. Automated exposure control, iterative reconstruction, and/or weight based adjustment of the mA/kV was utilized to reduce the radiation dose to as low as reasonably achievable. COMPARISON: None available. CLINICAL HISTORY: Pulmonary embolism (PE) suspected, high prob. FINDINGS: PULMONARY ARTERIES: Pulmonary arteries are adequately opacified for evaluation. No acute pulmonary embolus. Main pulmonary artery is normal in caliber. MEDIASTINUM: The heart and pericardium demonstrate no acute abnormality. There are atherosclerotic calcifications of the aorta. LYMPH NODES: Large right hilar lymph node measuring 14 mm. No mediastinal or axillary lymphadenopathy. LUNGS AND PLEURA: There is a trace of bilateral pleural effusions, left greater than right. No pneumothorax. There is atelectasis in the lingula. There is some scarring in both lung apices. There are calcified granulomas in the right upper lobe. The lungs are otherwise clear. UPPER ABDOMEN: Limited images of the upper abdomen are unremarkable. SOFT TISSUES AND BONES: No acute bone or soft tissue abnormality. IMPRESSION: 1. No pulmonary embolism. 2. Large right hilar lymph node measuring 14 mm. 3. Trace bilateral pleural effusions, left greater than right. Electronically signed by: Greig Pique MD 04/03/2024 07:28 PM EST RP Workstation: [id number]   DG Foot Complete Left Result Date: 04/03/2024 CLINICAL DATA:  Bruising and edema of left foot. EXAM: LEFT FOOT - COMPLETE 3+ VIEW COMPARISON:  None Available. FINDINGS: There is no evidence of acute fracture or dislocation. There is no evidence of arthropathy or other focal bone abnormality. Soft tissues are unremarkable. IMPRESSION: Negative. Electronically Signed   By: Marcey Moan M.D.   On: 04/03/2024 16:45   DG  Chest 2 View Result Date: 04/03/2024 CLINICAL DATA:  Lower extremity swelling. EXAM: CHEST - 2 VIEW COMPARISON:  March 24, 2024 FINDINGS: The cardiac silhouette is mildly enlarged and unchanged in size. Mild, chronic appearing increased interstitial lung markings are seen without evidence of focal consolidation or pneumothorax. A small left pleural effusion is suspected. A deformity of indeterminate age is seen along the distal right clavicle. Multilevel degenerative changes are present  throughout the thoracic spine. IMPRESSION: 1. Chronic appearing increased interstitial lung markings without evidence of acute or active cardiopulmonary disease. 2. Small left pleural effusion. 3. Deformity of indeterminate age along the distal right clavicle. Correlation with physical examination is recommended to determine the presence of point tenderness. Electronically Signed   By: Suzen Dials M.D.   On: 04/03/2024 13:11    Medications: Infusions:  cefTRIAXone  (ROCEPHIN )  IV 1 g (04/05/24 0828)   diltiazem  (CARDIZEM ) infusion Stopped (04/05/24 0802)    Scheduled Medications:  apixaban   5 mg Oral BID   Chlorhexidine  Gluconate Cloth  6 each Topical Daily   diltiazem   30 mg Oral Q6H   furosemide   40 mg Intravenous BID   guaiFENesin   600 mg Oral BID   Influenza vac split trivalent PF  0.5 mL Intramuscular Tomorrow-1000   ipratropium-albuterol   3 mL Nebulization Q6H   potassium chloride   40 mEq Oral Q4H    have reviewed scheduled and prn medications.  Physical Exam: General:NAD, comfortable Heart:RRR, s1s2 nl Lungs:clear b/l, no crackle Abdomen:soft, Non-tender, non-distended Extremities: Bilateral ankle edema+ Neurology: Alert, awake.  Smitty Ackerley Prasad Arrin Pintor 04/05/2024,9:11 AM  LOS: 2 days

## 2024-04-06 DIAGNOSIS — E871 Hypo-osmolality and hyponatremia: Secondary | ICD-10-CM | POA: Diagnosis not present

## 2024-04-06 DIAGNOSIS — J111 Influenza due to unidentified influenza virus with other respiratory manifestations: Secondary | ICD-10-CM | POA: Diagnosis not present

## 2024-04-06 DIAGNOSIS — I4891 Unspecified atrial fibrillation: Secondary | ICD-10-CM | POA: Diagnosis not present

## 2024-04-06 DIAGNOSIS — I5033 Acute on chronic diastolic (congestive) heart failure: Secondary | ICD-10-CM | POA: Diagnosis not present

## 2024-04-06 LAB — CBC
HCT: 30.3 % — ABNORMAL LOW (ref 36.0–46.0)
Hemoglobin: 10.4 g/dL — ABNORMAL LOW (ref 12.0–15.0)
MCH: 30.8 pg (ref 26.0–34.0)
MCHC: 34.3 g/dL (ref 30.0–36.0)
MCV: 89.6 fL (ref 80.0–100.0)
Platelets: 359 K/uL (ref 150–400)
RBC: 3.38 MIL/uL — ABNORMAL LOW (ref 3.87–5.11)
RDW: 14.8 % (ref 11.5–15.5)
WBC: 3.8 K/uL — ABNORMAL LOW (ref 4.0–10.5)
nRBC: 0 % (ref 0.0–0.2)

## 2024-04-06 LAB — BASIC METABOLIC PANEL WITH GFR
Anion gap: 14 (ref 5–15)
Anion gap: 15 (ref 5–15)
BUN: 18 mg/dL (ref 8–23)
BUN: 18 mg/dL (ref 8–23)
CO2: 22 mmol/L (ref 22–32)
CO2: 25 mmol/L (ref 22–32)
Calcium: 8.6 mg/dL — ABNORMAL LOW (ref 8.9–10.3)
Calcium: 8.9 mg/dL (ref 8.9–10.3)
Chloride: 87 mmol/L — ABNORMAL LOW (ref 98–111)
Chloride: 89 mmol/L — ABNORMAL LOW (ref 98–111)
Creatinine, Ser: 0.72 mg/dL (ref 0.44–1.00)
Creatinine, Ser: 0.85 mg/dL (ref 0.44–1.00)
GFR, Estimated: >60 mL/min
GFR, Estimated: >60 mL/min
Glucose, Bld: 81 mg/dL (ref 70–99)
Glucose, Bld: 97 mg/dL (ref 70–99)
Potassium: 4.7 mmol/L (ref 3.5–5.1)
Potassium: 4.3 mmol/L (ref 3.5–5.1)
Sodium: 123 mmol/L — ABNORMAL LOW (ref 135–145)
Sodium: 129 mmol/L — ABNORMAL LOW (ref 135–145)

## 2024-04-06 LAB — SODIUM: Sodium: 129 mmol/L — ABNORMAL LOW (ref 135–145)

## 2024-04-06 LAB — HEPATIC FUNCTION PANEL
ALT: 23 U/L (ref 0–44)
AST: 31 U/L (ref 15–41)
Albumin: 3.5 g/dL (ref 3.5–5.0)
Alkaline Phosphatase: 86 U/L (ref 38–126)
Bilirubin, Direct: 0.1 mg/dL (ref 0.0–0.2)
Indirect Bilirubin: 0.1 mg/dL — ABNORMAL LOW (ref 0.3–0.9)
Total Bilirubin: 0.2 mg/dL (ref 0.0–1.2)
Total Protein: 6.9 g/dL (ref 6.5–8.1)

## 2024-04-06 LAB — MAGNESIUM: Magnesium: 2 mg/dL (ref 1.7–2.4)

## 2024-04-06 MED ORDER — TOLVAPTAN 15 MG PO TABS
15.0000 mg | ORAL_TABLET | Freq: Once | ORAL | Status: AC
  Administered 2024-04-06: 15 mg via ORAL
  Filled 2024-04-06: qty 1

## 2024-04-06 NOTE — Progress Notes (Signed)
 "          PROGRESS NOTE  Glenda HANKEY FMW:[id number] DOB: 1934/07/14 DOA: 04/03/2024 PCP: Vick Lurie, FNP (Inactive)  Brief History:  89 year old female with a history of HFpEF, paroxysmal atrial fibrillation, hypertension, hyperlipidemia, prediabetes presenting with worsening lower extremity edema and coughing.  The patient was recently admitted to the hospital from 03/24/2024 to 03/31/2024 for hyponatremia, acute HFpEF, and new onset atrial fibrillation with RVR.  She was discharged home with torsemide  30 mg daily, Cardizem  CD 120 milligrams daily, and metoprolol  12.5 mg twice daily. The patient returns because of worsening lower extremity edema and cough.  She has had some difficulty ambulating secondary to pain associated with swelling of her legs and feet.  She denies any fevers, chills, chest pain, shortness breath, hemoptysis, nausea, vomiting, diarrhea, abdominal pain.  In the ED, she was afebrile and hemodynamically stable.  Initially she was tachycardic in the 120s.  She was started on IV furosemide .   Assessment/Plan: Acute on chronic HFpEF - 04/03/2024 CTA chest--negative PE, trace bilateral pleural effusions, left greater than right, biapical scarring,RUL calcified granuloma - 03/25/2024 echo EF 65 to 70%, no WMA, normal RVF - Continue lasix  to 40 mg IV bid>>increase to 80 mg BID -still has some signs of fluid overload, +JVD   Persistent atrial fibrillation with RVR - Continue apixaban  - 04/04/24--developed RVR>>started IV diltiazem  - 04/05/24--transition back to po diltiazem  - 03/25/24 Echo EF 65-70%, no WMA, normal RVF   Hyponatremia -04/05/24--Na down 127>>122 - due to volume overload and SIAHD -required tolvaptan  last hospitalization -nephrology consult appreciated>>redose tovalptan 1/9 -check serum Osm--266 -urine Osm 581 -urine Na 69   Influenza Bronchitis -1/9 personally reviewed CXR--increased interstitial markings -PCT 0.14 - started oseltamivir  1/8    Elevated troponin - Secondary to demand ischemia - No chest pain presently - Personally reviewed EKG--atrial fibrillation with nonspecific T changes   Large right hilar lymph node measuring 14 mm -Possibly reactive, will need to be monitored - Outpatient surveillance and possible PET scan   Generalized weakness PT OT evaluation>>HHPT Fall precautions.   anion gap metabolic acidosis Serum bicarb 20, anion gap 17 on day of admission -improved   Hypokalemia - Repleted - Check magnesium  2.0                 Family Communication: no  Family at bedside   Consultants:  none   Code Status:  FULL    DVT Prophylaxis:  apixaban      Procedures: As Listed in Progress Note Above   Antibiotics: Ceftriaxone  1/7>>1/8 Azithro 1/7         Subjective: Patient denies fevers, chills, headache, chest pain, dyspnea, nausea, vomiting, diarrhea, abdominal pain, dysuria, hematuria, hematochezia, and melena.   Objective: Vitals:   04/06/24 1200 04/06/24 1400 04/06/24 1445 04/06/24 1500  BP: 108/68 130/67  (!) 104/59  Pulse: (!) 118     Resp: 17 16  12   Temp: 97.7 F (36.5 C)     TempSrc: Oral     SpO2: 98%  98%   Weight:      Height:        Intake/Output Summary (Last 24 hours) at 04/06/2024 1714 Last data filed at 04/06/2024 1708 Gross per 24 hour  Intake 1771.46 ml  Output 3000 ml  Net -1228.54 ml   Weight change: -4.1 kg Exam:  General:  Pt is alert, follows commands appropriately, not in acute distress HEENT: No icterus, No thrush, No neck mass, [place]/AT Cardiovascular: RRR, S1/S2, no  rubs, no gallops Respiratory: bibasilar crackles. No wheeze Abdomen: Soft/+BS, non tender, non distended, no guarding Extremities: 2 + LE edema, No lymphangitis, No petechiae, No rashes, no synovitis   Data Reviewed: I have personally reviewed following labs and imaging studies Basic Metabolic Panel: Recent Labs  Lab 04/04/24 0409 04/05/24 0429 04/05/24 0905 04/05/24 1939  04/06/24 0542 04/06/24 1502 04/06/24 1516  NA 127* 119* 125* 122* 123* 129* 129*  K 3.2* 3.3* 3.3* 4.4 4.7 4.3  --   CL 86* 80* 85* 84* 87* 89*  --   CO2 33* 31 24 28 22 25   --   GLUCOSE 95 116* 137* 107* 81 97  --   BUN 16 15 14 20 18 18   --   CREATININE 0.75 0.79 0.84 0.86 0.72 0.85  --   CALCIUM 8.1* 8.4* 8.6* 8.3* 8.6* 8.9  --   MG 2.0 2.1  --   --  2.0  --   --   PHOS 3.9  --   --   --   --   --   --    Liver Function Tests: Recent Labs  Lab 04/06/24 0818  AST 31  ALT 23  ALKPHOS 86  BILITOT 0.2  PROT 6.9  ALBUMIN 3.5   No results for input(s): LIPASE, AMYLASE in the last 168 hours. No results for input(s): AMMONIA in the last 168 hours. Coagulation Profile: No results for input(s): INR, PROTIME in the last 168 hours. CBC: Recent Labs  Lab 04/03/24 1232 04/04/24 0409 04/06/24 0542  WBC 5.8 5.1 3.8*  NEUTROABS 4.9  --   --   HGB 10.0* 9.7* 10.4*  HCT 29.2* 27.8* 30.3*  MCV 90.1 88.3 89.6  PLT 368 362 359   Cardiac Enzymes: No results for input(s): CKTOTAL, CKMB, CKMBINDEX, TROPONINI in the last 168 hours. BNP: Invalid input(s): POCBNP CBG: No results for input(s): GLUCAP in the last 168 hours. HbA1C: No results for input(s): HGBA1C in the last 72 hours. Urine analysis:    Component Value Date/Time   COLORURINE YELLOW 03/24/2024 1038   APPEARANCEUR CLEAR 03/24/2024 1038   LABSPEC 1.020 03/24/2024 1038   PHURINE 6.0 03/24/2024 1038   GLUCOSEU NEGATIVE 03/24/2024 1038   HGBUR MODERATE (A) 03/24/2024 1038   BILIRUBINUR NEGATIVE 03/24/2024 1038   KETONESUR 5 (A) 03/24/2024 1038   PROTEINUR 30 (A) 03/24/2024 1038   NITRITE NEGATIVE 03/24/2024 1038   LEUKOCYTESUR NEGATIVE 03/24/2024 1038   Sepsis Labs: @LABRCNTIP (procalcitonin:4,lacticidven:4) ) Recent Results (from the past 240 hours)  Expectorated Sputum Assessment w Gram Stain, Rflx to Resp Cult     Status: None   Collection Time: 04/04/24  4:30 PM   Specimen: Sputum   Result Value Ref Range Status   Specimen Description SPUTUM  Final   Special Requests Normal  Final   Sputum evaluation   Final    THIS SPECIMEN IS ACCEPTABLE FOR SPUTUM CULTURE Performed at [facility], [address] [place], [place] [address]    Report Status 04/04/2024 FINAL  Final  Culture, Respiratory w Gram Stain     Status: None (Preliminary result)   Collection Time: 04/04/24  4:30 PM   Specimen: SPU  Result Value Ref Range Status   Specimen Description   Final    SPUTUM Performed at [facility], [address] [place], [place] [address]    Special Requests   Final    Normal Reflexed from [id number] Performed at [facility], [address], [place], [place] [address]  Gram Stain   Final    ABUNDANT SQUAMOUS EPITHELIAL CELLS PRESENT NO WBC SEEN FEW GRAM POSITIVE RODS RARE YEAST RARE GRAM POSITIVE COCCI RARE GRAM NEGATIVE RODS    Culture   Final    CULTURE REINCUBATED FOR BETTER GROWTH Performed at [facility], [address] [place], [place] [address]    Report Status PENDING  Incomplete  Resp panel by RT-PCR (RSV, Flu A&B, Covid) Nasal Mucosa     Status: Abnormal   Collection Time: 04/04/24  6:34 PM   Specimen: Nasal Mucosa; Nasal Swab  Result Value Ref Range Status   SARS Coronavirus 2 by RT PCR NEGATIVE NEGATIVE Final    Comment: (NOTE) SARS-CoV-2 target nucleic acids are NOT DETECTED.  The SARS-CoV-2 RNA is generally detectable in upper respiratory specimens during the acute phase of infection. The lowest concentration of SARS-CoV-2 viral copies this assay can detect is 138 copies/mL. A negative result does not preclude SARS-Cov-2 infection and should not be used as the sole basis for treatment or other patient management decisions. A negative result may occur with  improper specimen collection/handling, submission of specimen other than nasopharyngeal swab, presence of viral mutation(s) within the areas targeted by this assay, and inadequate  number of viral copies(<138 copies/mL). A negative result must be combined with clinical observations, patient history, and epidemiological information. The expected result is Negative.  Fact Sheet for Patients:  bloggercourse.com  Fact Sheet for Healthcare Providers:  seriousbroker.it  This test is no t yet approved or cleared by the [place]  FDA and  has been authorized for detection and/or diagnosis of SARS-CoV-2 by FDA under an Emergency Use Authorization (EUA). This EUA will remain  in effect (meaning this test can be used) for the duration of the COVID-19 declaration under Section 564(b)(1) of the Act, 21 U.S.C.section 360bbb-3(b)(1), unless the authorization is terminated  or revoked sooner.       Influenza A by PCR POSITIVE (A) NEGATIVE Final   Influenza B by PCR NEGATIVE NEGATIVE Final    Comment: (NOTE) The Xpert Xpress SARS-CoV-2/FLU/RSV plus assay is intended as an aid in the diagnosis of influenza from Nasopharyngeal swab specimens and should not be used as a sole basis for treatment. Nasal washings and aspirates are unacceptable for Xpert Xpress SARS-CoV-2/FLU/RSV testing.  Fact Sheet for Patients: bloggercourse.com  Fact Sheet for Healthcare Providers: seriousbroker.it  This test is not yet approved or cleared by the [place]  FDA and has been authorized for detection and/or diagnosis of SARS-CoV-2 by FDA under an Emergency Use Authorization (EUA). This EUA will remain in effect (meaning this test can be used) for the duration of the COVID-19 declaration under Section 564(b)(1) of the Act, 21 U.S.C. section 360bbb-3(b)(1), unless the authorization is terminated or revoked.     Resp Syncytial Virus by PCR NEGATIVE NEGATIVE Final    Comment: (NOTE) Fact Sheet for Patients: bloggercourse.com  Fact Sheet for Healthcare  Providers: seriousbroker.it  This test is not yet approved or cleared by the [place]  FDA and has been authorized for detection and/or diagnosis of SARS-CoV-2 by FDA under an Emergency Use Authorization (EUA). This EUA will remain in effect (meaning this test can be used) for the duration of the COVID-19 declaration under Section 564(b)(1) of the Act, 21 U.S.C. section 360bbb-3(b)(1), unless the authorization is terminated or revoked.  Performed at [facility], [address], [place], [place] [address]   Respiratory (~20 pathogens) panel by PCR     Status: Abnormal  Collection Time: 04/04/24  6:34 PM   Specimen: Nasopharyngeal Swab; Respiratory  Result Value Ref Range Status   Adenovirus NOT DETECTED NOT DETECTED Final   Coronavirus 229E NOT DETECTED NOT DETECTED Final    Comment: (NOTE) The Coronavirus on the Respiratory Panel, DOES NOT test for the novel  Coronavirus (2019 nCoV)    Coronavirus HKU1 NOT DETECTED NOT DETECTED Final   Coronavirus NL63 NOT DETECTED NOT DETECTED Final   Coronavirus OC43 NOT DETECTED NOT DETECTED Final   Metapneumovirus NOT DETECTED NOT DETECTED Final   Rhinovirus / Enterovirus NOT DETECTED NOT DETECTED Final   Influenza A H3 DETECTED (A) NOT DETECTED Final   Influenza B NOT DETECTED NOT DETECTED Final   Parainfluenza Virus 1 NOT DETECTED NOT DETECTED Final   Parainfluenza Virus 2 NOT DETECTED NOT DETECTED Final   Parainfluenza Virus 3 NOT DETECTED NOT DETECTED Final   Parainfluenza Virus 4 NOT DETECTED NOT DETECTED Final   Respiratory Syncytial Virus NOT DETECTED NOT DETECTED Final   Bordetella pertussis NOT DETECTED NOT DETECTED Final   Bordetella Parapertussis NOT DETECTED NOT DETECTED Final   Chlamydophila pneumoniae NOT DETECTED NOT DETECTED Final   Mycoplasma pneumoniae NOT DETECTED NOT DETECTED Final    Comment: Performed at [facility], [address] [place], [place] [address]  MRSA Next Gen by PCR,  Nasal     Status: None   Collection Time: 04/04/24  6:34 PM   Specimen: Nasal Mucosa; Nasal Swab  Result Value Ref Range Status   MRSA by PCR Next Gen NOT DETECTED NOT DETECTED Final    Comment: (NOTE) The GeneXpert MRSA Assay (FDA approved for NASAL specimens only), is one component of a comprehensive MRSA colonization surveillance program. It is not intended to diagnose MRSA infection nor to guide or monitor treatment for MRSA infections. Test performance is not FDA approved in patients less than 20 years old. Performed at [facility], [address], [place], [place] [address]      Scheduled Meds:  apixaban   5 mg Oral BID   Chlorhexidine  Gluconate Cloth  6 each Topical Daily   diltiazem   30 mg Oral Q6H   furosemide   80 mg Intravenous BID   guaiFENesin   600 mg Oral BID   ipratropium-albuterol   3 mL Nebulization Q6H   oseltamivir   30 mg Oral BID   Continuous Infusions:  cefTRIAXone  (ROCEPHIN )  IV 1 g (04/06/24 0531)    Procedures/Studies: DG CHEST PORT 1 VIEW Result Date: 04/05/2024 CLINICAL DATA:  Acute on chronic heart failure. Influenza bronchitis. EXAM: PORTABLE CHEST 1 VIEW COMPARISON:  04/03/2024 FINDINGS: Mildly decreased inspiration. Stable enlarged cardiac silhouette and mildly tortuous thoracic aorta. Mild chronic interstitial prominence. Minimal left lower lobe atelectasis. Diffuse osteopenia. IMPRESSION: 1. Minimal left lower lobe atelectasis. 2. Stable cardiomegaly and mild chronic interstitial disease. Electronically Signed   By: Elspeth Bathe M.D.   On: 04/05/2024 13:45   CT Angio Chest PE W and/or Wo Contrast Result Date: 04/03/2024 EXAM: CTA CHEST 04/03/2024 06:44:08 PM TECHNIQUE: CTA of the chest was performed without and with the administration of 75 mL of iohexol  (OMNIPAQUE ) 350 MG/ML injection. Multiplanar reformatted images are provided for review. MIP images are provided for review. Automated exposure control, iterative reconstruction, and/or weight based  adjustment of the mA/kV was utilized to reduce the radiation dose to as low as reasonably achievable. COMPARISON: None available. CLINICAL HISTORY: Pulmonary embolism (PE) suspected, high prob. FINDINGS: PULMONARY ARTERIES: Pulmonary arteries are adequately opacified for evaluation. No acute pulmonary  embolus. Main pulmonary artery is normal in caliber. MEDIASTINUM: The heart and pericardium demonstrate no acute abnormality. There are atherosclerotic calcifications of the aorta. LYMPH NODES: Large right hilar lymph node measuring 14 mm. No mediastinal or axillary lymphadenopathy. LUNGS AND PLEURA: There is a trace of bilateral pleural effusions, left greater than right. No pneumothorax. There is atelectasis in the lingula. There is some scarring in both lung apices. There are calcified granulomas in the right upper lobe. The lungs are otherwise clear. UPPER ABDOMEN: Limited images of the upper abdomen are unremarkable. SOFT TISSUES AND BONES: No acute bone or soft tissue abnormality. IMPRESSION: 1. No pulmonary embolism. 2. Large right hilar lymph node measuring 14 mm. 3. Trace bilateral pleural effusions, left greater than right. Electronically signed by: Greig Pique MD 04/03/2024 07:28 PM EST RP Workstation: [id number]   DG Foot Complete Left Result Date: 04/03/2024 CLINICAL DATA:  Bruising and edema of left foot. EXAM: LEFT FOOT - COMPLETE 3+ VIEW COMPARISON:  None Available. FINDINGS: There is no evidence of acute fracture or dislocation. There is no evidence of arthropathy or other focal bone abnormality. Soft tissues are unremarkable. IMPRESSION: Negative. Electronically Signed   By: Marcey Moan M.D.   On: 04/03/2024 16:45   DG Chest 2 View Result Date: 04/03/2024 CLINICAL DATA:  Lower extremity swelling. EXAM: CHEST - 2 VIEW COMPARISON:  March 24, 2024 FINDINGS: The cardiac silhouette is mildly enlarged and unchanged in size. Mild, chronic appearing increased interstitial lung markings are seen  without evidence of focal consolidation or pneumothorax. A small left pleural effusion is suspected. A deformity of indeterminate age is seen along the distal right clavicle. Multilevel degenerative changes are present throughout the thoracic spine. IMPRESSION: 1. Chronic appearing increased interstitial lung markings without evidence of acute or active cardiopulmonary disease. 2. Small left pleural effusion. 3. Deformity of indeterminate age along the distal right clavicle. Correlation with physical examination is recommended to determine the presence of point tenderness. Electronically Signed   By: Suzen Dials M.D.   On: 04/03/2024 13:11   US  Venous Img Upper Uni Left (DVT) Result Date: 03/27/2024 CLINICAL DATA:  Left upper extremity edema EXAM: LEFT UPPER EXTREMITY VENOUS DOPPLER ULTRASOUND TECHNIQUE: Gray-scale sonography with graded compression, as well as color Doppler and duplex ultrasound were performed to evaluate the upper extremity deep venous system from the level of the subclavian vein and including the jugular, axillary, basilic, radial, ulnar and upper cephalic vein. Spectral Doppler was utilized to evaluate flow at rest and with distal augmentation maneuvers. COMPARISON:  None Available. FINDINGS: Contralateral Subclavian Vein: Respiratory phasicity is normal and symmetric with the symptomatic side. No evidence of thrombus. Normal compressibility. Internal Jugular Vein: No evidence of thrombus. Normal compressibility, respiratory phasicity and response to augmentation. Subclavian Vein: No evidence of thrombus. Normal compressibility, respiratory phasicity and response to augmentation. Axillary Vein: No evidence of thrombus. Normal compressibility, respiratory phasicity and response to augmentation. Cephalic Vein: Left cephalic vein at the antecubital fossa demonstrates ill-defined hypoechoic intraluminal thrombus and is noncompressible compatible with superficial  thrombosis/thrombophlebitis. Basilic Vein: No evidence of thrombus. Normal compressibility, respiratory phasicity and response to augmentation. Brachial Veins: No evidence of thrombus. Normal compressibility, respiratory phasicity and response to augmentation. Radial Veins: No evidence of thrombus. Normal compressibility, respiratory phasicity and response to augmentation. Ulnar Veins: No evidence of thrombus. Normal compressibility, respiratory phasicity and response to augmentation. IMPRESSION: 1. Negative for left upper extremity DVT. 2. Positive for left cephalic vein superficial thrombosis/thrombophlebitis at the antecubital fossa. Electronically Signed  By: EMERSON Specking M.D.   On: 03/27/2024 10:30   ECHOCARDIOGRAM COMPLETE Result Date: 03/25/2024    ECHOCARDIOGRAM REPORT   Patient Name:   VIANN NIELSON Date of Exam: 03/25/2024 Medical Rec #:  [id number]           Height:       67.0 in Accession #:    [id number]          Weight:       188.3 lb Date of Birth:  [birth year]           BSA:          1.971 m Patient Age:    89 years            BP:           126/57 mmHg Patient Gender: F                   HR:           102 bpm. Exam Location:  Inpatient Procedure: 2D Echo, Cardiac Doppler and Color Doppler (Both Spectral and Color            Flow Doppler were utilized during procedure). Indications:    Atrial fibrillation  History:        Patient has no prior history of Echocardiogram examinations.                 Arrythmias:Atrial Fibrillation; Risk Factors:Hypertension.  Sonographer:    Philomena Daring Referring Phys: [email or web address] Bernon Arviso IMPRESSIONS  1. Left ventricular ejection fraction, by estimation, is 65 to 70%. The left ventricle has normal function. The left ventricle has no regional wall motion abnormalities. There is mild concentric left ventricular hypertrophy. Left ventricular diastolic parameters are indeterminate.  2. Right ventricular systolic function is normal. The right ventricular size is normal.  3.  The mitral valve is normal in structure. No evidence of mitral valve regurgitation. No evidence of mitral stenosis.  4. The aortic valve was not well visualized. There is mild calcification of the aortic valve. Aortic valve regurgitation is mild. Aortic valve sclerosis is present, with no evidence of aortic valve stenosis.  5. The inferior vena cava is normal in size with greater than 50% respiratory variability, suggesting right atrial pressure of 3 mmHg. Comparison(s): No prior Echocardiogram. FINDINGS  Left Ventricle: Left ventricular ejection fraction, by estimation, is 65 to 70%. The left ventricle has normal function. The left ventricle has no regional wall motion abnormalities. The left ventricular internal cavity size was normal in size. There is  mild concentric left ventricular hypertrophy. Left ventricular diastolic parameters are indeterminate. Right Ventricle: The right ventricular size is normal. No increase in right ventricular wall thickness. Right ventricular systolic function is normal. Left Atrium: Left atrial size was normal in size. Right Atrium: Right atrial size was normal in size. Pericardium: There is no evidence of pericardial effusion. Mitral Valve: The mitral valve is normal in structure. No evidence of mitral valve regurgitation. No evidence of mitral valve stenosis. Tricuspid Valve: The tricuspid valve is normal in structure. Tricuspid valve regurgitation is mild . No evidence of tricuspid stenosis. Aortic Valve: The aortic valve was not well visualized. There is mild calcification of the aortic valve. Aortic valve regurgitation is mild. Aortic regurgitation PHT measures 465 msec. Aortic valve sclerosis is present, with no evidence of aortic valve stenosis. Pulmonic Valve: The pulmonic valve was normal in structure. Pulmonic valve regurgitation is mild. No evidence of pulmonic stenosis.  Aorta: The aortic root and ascending aorta are structurally normal, with no evidence of dilitation.  Venous: The inferior vena cava is normal in size with greater than 50% respiratory variability, suggesting right atrial pressure of 3 mmHg. IAS/Shunts: No atrial level shunt detected by color flow Doppler.  LEFT VENTRICLE PLAX 2D LVIDd:         3.90 cm   Diastology LVIDs:         2.60 cm   LV e' medial:    10.23 cm/s LV PW:         1.10 cm   LV E/e' medial:  9.5 LV IVS:        1.10 cm   LV e' lateral:   14.80 cm/s LVOT diam:     1.80 cm   LV E/e' lateral: 6.5 LV SV:         42 LV SV Index:   22 LVOT Area:     2.54 cm  RIGHT VENTRICLE             IVC RV Basal diam:  2.90 cm     IVC diam: 1.40 cm RV Mid diam:    2.20 cm RV S prime:     14.00 cm/s TAPSE (M-mode): 2.2 cm LEFT ATRIUM             Index        RIGHT ATRIUM           Index LA diam:        3.30 cm 1.67 cm/m   RA Area:     15.70 cm LA Vol (A2C):   46.8 ml 23.74 ml/m  RA Volume:   37.00 ml  18.77 ml/m LA Vol (A4C):   42.7 ml 21.66 ml/m LA Biplane Vol: 48.7 ml 24.71 ml/m  AORTIC VALVE LVOT Vmax:   129.33 cm/s LVOT Vmean:  83.367 cm/s LVOT VTI:    0.167 m AI PHT:      465 msec  AORTA Ao Root diam: 3.20 cm Ao Asc diam:  3.20 cm MITRAL VALVE               TRICUSPID VALVE MV Area (PHT): 4.67 cm    TR Peak grad:   30.9 mmHg MV Decel Time: 163 msec    TR Vmax:        278.00 cm/s MV E velocity: 96.90 cm/s                            SHUNTS                            Systemic VTI:  0.17 m                            Systemic Diam: 1.80 cm Stanly Leavens MD Electronically signed by Stanly Leavens MD Signature Date/Time: 03/25/2024/12:13:57 PM    Final    DG Chest 1 View Result Date: 03/24/2024 CLINICAL DATA:  Weakness and fall. EXAM: CHEST  1 VIEW COMPARISON:  None available. FINDINGS: The heart is mildly enlarged. No pulmonary vascular congestion. Mediastinal silhouette within normal limits. Lungs are clear. Deformity of the lateral RIGHT clavicle suspicious for fracture. IMPRESSION: 1. Mild cardiomegaly. 2. Deformity of the lateral RIGHT clavicle  suspicious for fracture. Please correlate for focal tenderness. Electronically Signed   By: Aliene Katha HERO.D.  On: 03/24/2024 14:04   DG Knee Complete 4 Views Right Result Date: 03/24/2024 EXAM: 4 VIEW(S) XRAY OF THE KNEE 03/24/2024 11:07:00 AM COMPARISON: None available. CLINICAL HISTORY: fall FINDINGS: BONES AND JOINTS: No acute fracture. No malalignment. No significant joint effusion. Mild lateral compartment joint space narrowing. Marginal osteophytosis and subchondral sclerosis of the lateral compartment. Additional subtle marginal osteophytosis of the medial compartment. SOFT TISSUES: Mild diffuse soft tissue swelling. IMPRESSION: 1. No acute fracture or dislocation. 2. Mild diffuse soft tissue swelling. 3. Lateral compartment predominant osteoarthritis. Electronically signed by: Donnice Mania MD 03/24/2024 11:27 AM EST RP Workstation: [id number]   CT Cervical Spine Wo Contrast Result Date: 03/24/2024 EXAM: CT CERVICAL SPINE WITHOUT CONTRAST 03/24/2024 10:59:54 AM TECHNIQUE: CT of the cervical spine was performed without the administration of intravenous contrast. Multiplanar reformatted images are provided for review. Automated exposure control, iterative reconstruction, and/or weight based adjustment of the mA/kV was utilized to reduce the radiation dose to as low as reasonably achievable. COMPARISON: None available. CLINICAL HISTORY: Neck trauma (Age >= 65y) FINDINGS: BONES AND ALIGNMENT: Straightening of the normal cervical lordosis. Trace degenerative anterolisthesis of C2 on C3 and C3 on C4. Additional trace degenerative anterolisthesis of C7 on T1. No acute fracture or traumatic malalignment. DEGENERATIVE CHANGES: Moderate disc space narrowing at C4-C5. Additional disc space narrowing at C5-C6 and C6-C7. Mild degenerative endplate osteophytes at multiple levels. There are disc osteophyte complexes at multiple levels without evidence of high grade osseous spinal canal stenosis. Facet arthrosis  and uncovertebral hypertrophy at multiple levels. SOFT TISSUES: No prevertebral soft tissue swelling. LUNGS: Scarring and atelectasis in the right lung apex. IMPRESSION: 1. No evidence of acute traumatic injury. 2. Degenerative changes as above. Electronically signed by: Donnice Mania MD 03/24/2024 11:25 AM EST RP Workstation: [id number]   CT Head Wo Contrast Result Date: 03/24/2024 EXAM: CT HEAD WITHOUT CONTRAST 03/24/2024 10:59:54 AM TECHNIQUE: CT of the head was performed without the administration of intravenous contrast. Automated exposure control, iterative reconstruction, and/or weight based adjustment of the mA/kV was utilized to reduce the radiation dose to as low as reasonably achievable. COMPARISON: None available. CLINICAL HISTORY: Head trauma, minor (Age >= 65y). FINDINGS: BRAIN AND VENTRICLES: No acute hemorrhage. 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 Mild chronic microvascular ischemic change. No evidence of acute infarct. No hydrocephalus. No extra-axial collection. No mass effect or midline shift. ORBITS: Bilateral lens replacement noted. SINUSES: Trace right mastoid effusion. SOFT TISSUES AND SKULL: Right periorbital soft tissue swelling. No skull fracture. IMPRESSION: 1. No acute intracranial abnormality. 2. Right periorbital soft tissue swelling. 3. Focal encephalomalacia along the anterolateral left temporal lobe, suggestive of small remote infarct or sequelae of prior trauma. Electronically signed by: Donnice Mania MD 03/24/2024 11:18 AM EST RP Workstation: [id number]    Alm Schneider, DO  [facility]  If 7PM-7AM, please contact night-coverage [email or web address] Password [email or web address] 04/06/2024, 5:14 PM   LOS: 3 days   "

## 2024-04-06 NOTE — Progress Notes (Signed)
 [facility] NEPHROLOGY PROGRESS NOTE  Assessment/ Plan: Pt is a 89 y.o. yo female with past medical history significant for Nguyen, Glenda Nguyen, Glenda Nguyen, Glenda Nguyen, Glenda Nguyen, Glenda Nguyen team saw the patient for Nguyen which was treated with diuretics and a dose of tolvaptan  with improvement of sodium to 130.  Now we are reconsulted for worsening Nguyen.  # Acute on chronic Nguyen: Longstanding issue and historical problem.  Does have chronic lower extremity edema.  Hard to tell if this is lymphedema.  Does probably have some preponderance for volume overload related to heart failure with preserved ejection fraction.  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  Has responded to tolvaptan  in the past.  Likely worse at this time related to viral illness - Continue with IV Lasix  twice daily - Switch to oral diuretics as able - Tolvaptan  15 mg today - Continue to monitor sodium during the day - Reinstitute volume restriction tomorrow morning - Likely would benefit from outpatient nephrology follow-up  # Hypokalemia: Has proved with potassium supplementation  # Acute on chronic Glenda with preserved EF:, Uretic management as above  # Influenza/acute bronchitis: Supportive treatment per primary team.  # HTN/volume: Monitor BP, managing volume with diuretics.   Subjective: Patient tired but overall feels well.  Sodium remains low at 123. Objective Vital signs in last 24 hours: Vitals:   04/06/24 0531 04/06/24 0600 04/06/24 0700 04/06/24 0800  BP: 109/69 109/72  108/68  Pulse: 100 91  97  Resp: 20 (!) 29  (!) 33  Temp:   98.1 F (36.7 C)   TempSrc:   Oral   SpO2: 96% 97%  97%  Weight:      Height:       Weight change: -4.1 kg  Intake/Output Summary (Last 24 hours) at 04/06/2024  0902 Last data filed at 04/05/2024 1730 Gross per 24 hour  Intake 691.46 ml  Output --  Net 691.46 ml       Labs: RENAL PANEL Recent Labs  Lab 04/04/24 0409 04/05/24 0429 04/05/24 0905 04/05/24 1939 04/06/24 0542 04/06/24 0818  NA 127* 119* 125* 122* 123*  --   K 3.2* 3.3* 3.3* 4.4 4.7  --   CL 86* 80* 85* 84* 87*  --   CO2 33* 31 24 28 22   --   GLUCOSE 95 116* 137* 107* 81  --   BUN 16 15 14 20 18   --   CREATININE 0.75 0.79 0.84 0.86 0.72  --   CALCIUM 8.1* 8.4* 8.6* 8.3* 8.6*  --   MG 2.0 2.1  --   --  2.0  --   PHOS 3.9  --   --   --   --   --   ALBUMIN  --   --   --   --   --  3.5    Liver Function Tests: Recent Labs  Lab 04/06/24 0818  AST 31  ALT 23  ALKPHOS 86  BILITOT 0.2  PROT 6.9  ALBUMIN 3.5   No results for input(s): LIPASE, AMYLASE in the last 168 hours. No results for input(s): AMMONIA in the last 168 hours. CBC: Recent Labs    03/24/24 1110 03/24/24 1118 03/24/24 1723 03/25/24 0552 03/25/24 1118 04/03/24 1232 04/04/24 0409 04/06/24 0542  HGB 9.9*  --   --  10.0*  --  10.0* 9.7* 10.4*  MCV 85.4  --   --  86.1  --  90.1 88.3 89.6  VITAMINB12  --   --  1,127*  --  1,182*  --   --   --   FOLATE  --  7.6 11.6  --   --   --   --   --   FERRITIN  --   --   --   --  301  --   --   --   TIBC  --   --   --   --  248*  --   --   --   IRON  --   --   --   --  16*  --   --   --     Cardiac Enzymes: No results for input(s): CKTOTAL, CKMB, CKMBINDEX, TROPONINI in the last 168 hours. CBG: No results for input(s): GLUCAP in the last 168 hours.  Iron Studies: No results for input(s): IRON, TIBC, TRANSFERRIN, FERRITIN in the last 72 hours. Studies/Results: DG CHEST PORT 1 VIEW Result Date: 04/05/2024 CLINICAL DATA:  Acute on chronic heart failure. Influenza bronchitis. EXAM: PORTABLE CHEST 1 VIEW COMPARISON:  04/03/2024 FINDINGS: Mildly decreased inspiration. Stable enlarged cardiac silhouette and mildly tortuous thoracic  aorta. Mild chronic interstitial prominence. Minimal left lower lobe atelectasis. Diffuse osteopenia. IMPRESSION: 1. Minimal left lower lobe atelectasis. 2. Stable cardiomegaly and mild chronic interstitial disease. Electronically Signed   By: Elspeth Bathe M.D.   On: 04/05/2024 13:45    Medications: Infusions:  cefTRIAXone  (ROCEPHIN )  IV 1 g (04/06/24 0531)    Scheduled Medications:  apixaban   5 mg Oral BID   Chlorhexidine  Gluconate Cloth  6 each Topical Daily   diltiazem   30 mg Oral Q6H   furosemide   80 mg Intravenous BID   guaiFENesin   600 mg Oral BID   ipratropium-albuterol   3 mL Nebulization Q6H   oseltamivir   30 mg Oral BID   tolvaptan   15 mg Oral Once    have reviewed scheduled and prn medications.  Physical Exam: General:NAD, comfortable Heart: Normal rate, no rub Lungs: Bilateral chest rise, no increased work of breathing Abdomen:soft, Non-tender, non-distended Extremities: Bilateral lower extremity edema up to the knees Neurology: Alert, awake.  Glenda Nguyen 04/06/2024,9:02 AM  LOS: 3 days

## 2024-04-06 NOTE — Plan of Care (Signed)

## 2024-04-07 DIAGNOSIS — I4891 Unspecified atrial fibrillation: Secondary | ICD-10-CM | POA: Diagnosis not present

## 2024-04-07 DIAGNOSIS — E871 Hypo-osmolality and hyponatremia: Secondary | ICD-10-CM | POA: Diagnosis not present

## 2024-04-07 DIAGNOSIS — I5033 Acute on chronic diastolic (congestive) heart failure: Secondary | ICD-10-CM | POA: Diagnosis not present

## 2024-04-07 DIAGNOSIS — J111 Influenza due to unidentified influenza virus with other respiratory manifestations: Secondary | ICD-10-CM | POA: Diagnosis not present

## 2024-04-07 LAB — BASIC METABOLIC PANEL WITH GFR
Anion gap: 9 (ref 5–15)
Anion gap: 14 (ref 5–15)
BUN: 14 mg/dL (ref 8–23)
BUN: 19 mg/dL (ref 8–23)
CO2: 33 mmol/L — ABNORMAL HIGH (ref 22–32)
CO2: 26 mmol/L (ref 22–32)
Calcium: 9.1 mg/dL (ref 8.9–10.3)
Calcium: 8.9 mg/dL (ref 8.9–10.3)
Chloride: 90 mmol/L — ABNORMAL LOW (ref 98–111)
Chloride: 90 mmol/L — ABNORMAL LOW (ref 98–111)
Creatinine, Ser: 0.85 mg/dL (ref 0.44–1.00)
Creatinine, Ser: 0.89 mg/dL (ref 0.44–1.00)
GFR, Estimated: >60 mL/min
GFR, Estimated: >60 mL/min
Glucose, Bld: 137 mg/dL — ABNORMAL HIGH (ref 70–99)
Glucose, Bld: 102 mg/dL — ABNORMAL HIGH (ref 70–99)
Potassium: 3.9 mmol/L (ref 3.5–5.1)
Potassium: 3.9 mmol/L (ref 3.5–5.1)
Sodium: 131 mmol/L — ABNORMAL LOW (ref 135–145)
Sodium: 130 mmol/L — ABNORMAL LOW (ref 135–145)

## 2024-04-07 LAB — CBC
HCT: 33.3 % — ABNORMAL LOW (ref 36.0–46.0)
Hemoglobin: 11.1 g/dL — ABNORMAL LOW (ref 12.0–15.0)
MCH: 30.1 pg (ref 26.0–34.0)
MCHC: 33.3 g/dL (ref 30.0–36.0)
MCV: 90.2 fL (ref 80.0–100.0)
Platelets: 375 K/uL (ref 150–400)
RBC: 3.69 MIL/uL — ABNORMAL LOW (ref 3.87–5.11)
RDW: 14.9 % (ref 11.5–15.5)
WBC: 4.2 K/uL (ref 4.0–10.5)
nRBC: 0 % (ref 0.0–0.2)

## 2024-04-07 LAB — SODIUM: Sodium: 132 mmol/L — ABNORMAL LOW (ref 135–145)

## 2024-04-07 MED ORDER — BENZONATATE 100 MG PO CAPS
100.0000 mg | ORAL_CAPSULE | Freq: Once | ORAL | Status: AC
  Administered 2024-04-07: 100 mg via ORAL
  Filled 2024-04-07: qty 1

## 2024-04-07 MED ORDER — IPRATROPIUM-ALBUTEROL 0.5-2.5 (3) MG/3ML IN SOLN
3.0000 mL | Freq: Three times a day (TID) | RESPIRATORY_TRACT | Status: DC
  Administered 2024-04-08 – 2024-04-09 (×4): 3 mL via RESPIRATORY_TRACT
  Filled 2024-04-07 (×4): qty 3

## 2024-04-07 MED ORDER — BENZONATATE 100 MG PO CAPS
100.0000 mg | ORAL_CAPSULE | Freq: Two times a day (BID) | ORAL | Status: DC | PRN
  Administered 2024-04-08 – 2024-04-11 (×3): 100 mg via ORAL
  Filled 2024-04-07 (×3): qty 1

## 2024-04-07 NOTE — Progress Notes (Signed)
 "          PROGRESS NOTE  Glenda Nguyen FMW:[id number] DOB: 1934/08/06 DOA: 04/03/2024 PCP: Vick Lurie, FNP (Inactive)  Brief History:  89 year old female with a history of HFpEF, paroxysmal atrial fibrillation, hypertension, hyperlipidemia, prediabetes presenting with worsening lower extremity edema and coughing.  The patient was recently admitted to the hospital from 03/24/2024 to 03/31/2024 for hyponatremia, acute HFpEF, and new onset atrial fibrillation with RVR.  She was discharged home with torsemide  30 mg daily, Cardizem  CD 120 milligrams daily, and metoprolol  12.5 mg twice daily. The patient returns because of worsening lower extremity edema and cough.  She has had some difficulty ambulating secondary to pain associated with swelling of her legs and feet.  She denies any fevers, chills, chest pain, shortness breath, hemoptysis, nausea, vomiting, diarrhea, abdominal pain.  In the ED, she was afebrile and hemodynamically stable.  Initially she was tachycardic in the 120s.  She was started on IV furosemide .   Assessment/Plan: Acute on chronic HFpEF - 04/03/2024 CTA chest--negative PE, trace bilateral pleural effusions, left greater than right, biapical scarring,RUL calcified granuloma - 03/25/2024 echo EF 65 to 70%, no WMA, normal RVF - Continue lasix  to 40 mg IV bid>>increase to 80 mg BID -still has some signs of fluid overload   Persistent atrial fibrillation with RVR - Continue apixaban  - 04/04/24--developed RVR>>started IV diltiazem  - 04/05/24--transition back to po diltiazem  - 03/25/24 Echo EF 65-70%, no WMA, normal RVF   Hyponatremia -04/05/24--Na down 127>>122>>131 (after tolvaptan ) - due to volume overload and SIAHD -required tolvaptan  last hospitalization -nephrology consult appreciated>>redose tovalptan 1/9 -check serum Osm--266 -urine Osm 581 -urine Na 69   Influenza Bronchitis -1/9 personally reviewed CXR--increased interstitial markings -PCT 0.14 - started  oseltamivir  1/8   Elevated troponin - Secondary to demand ischemia - No chest pain presently - Personally reviewed EKG--atrial fibrillation with nonspecific T changes   Large right hilar lymph node measuring 14 mm -Possibly reactive, will need to be monitored - Outpatient surveillance and possible PET scan   Generalized weakness PT OT evaluation>>HHPT Fall precautions.   anion gap metabolic acidosis Serum bicarb 20, anion gap 17 on day of admission -improved   Hypokalemia - Repleted - Check magnesium  2.0                 Family Communication: no  Family at bedside   Consultants:  none   Code Status:  FULL    DVT Prophylaxis:  apixaban      Procedures: As Listed in Progress Note Above   Antibiotics: Ceftriaxone  1/7>>1/8 Azithro 1/7           Subjective: Pt states her cough and chest congestion are improving.  Denies f/c, cp, sob, n/v/d, abd pain  Objective: Vitals:   04/07/24 1500 04/07/24 1540 04/07/24 1600 04/07/24 1630  BP: 110/61   121/77  Pulse:  98    Resp: (!) 22 20    Temp:   98.1 F (36.7 C)   TempSrc:   Oral   SpO2: 96% 97%    Weight:      Height:        Intake/Output Summary (Last 24 hours) at 04/07/2024 1740 Last data filed at 04/07/2024 1541 Gross per 24 hour  Intake 720 ml  Output 3725 ml  Net -3005 ml   Weight change: -3.6 kg Exam:  General:  Pt is alert, follows commands appropriately, not in acute distress HEENT: No icterus, No thrush, No neck mass, [place]/AT Cardiovascular: IRRR, S1/S2, no  rubs, no gallops Respiratory: fine bibasilar crackles. No wheeze Abdomen: Soft/+BS, non tender, non distended, no guarding Extremities: 1 + LE edema, No lymphangitis, No petechiae, No rashes, no synovitis   Data Reviewed: I have personally reviewed following labs and imaging studies Basic Metabolic Panel: Recent Labs  Lab 04/04/24 0409 04/05/24 0429 04/05/24 0905 04/05/24 1939 04/06/24 0542 04/06/24 1502 04/06/24 1516  04/07/24 0103 04/07/24 0945 04/07/24 1624  NA 127* 119*   < > 122* 123* 129* 129* 132* 131* 130*  K 3.2* 3.3*   < > 4.4 4.7 4.3  --   --  3.9 3.9  CL 86* 80*   < > 84* 87* 89*  --   --  90* 90*  CO2 33* 31   < > 28 22 25   --   --  33* 26  GLUCOSE 95 116*   < > 107* 81 97  --   --  137* 102*  BUN 16 15   < > 20 18 18   --   --  14 19  CREATININE 0.75 0.79   < > 0.86 0.72 0.85  --   --  0.85 0.89  CALCIUM 8.1* 8.4*   < > 8.3* 8.6* 8.9  --   --  9.1 8.9  MG 2.0 2.1  --   --  2.0  --   --   --   --   --   PHOS 3.9  --   --   --   --   --   --   --   --   --    < > = values in this interval not displayed.   Liver Function Tests: Recent Labs  Lab 04/06/24 0818  AST 31  ALT 23  ALKPHOS 86  BILITOT 0.2  PROT 6.9  ALBUMIN 3.5   No results for input(s): LIPASE, AMYLASE in the last 168 hours. No results for input(s): AMMONIA in the last 168 hours. Coagulation Profile: No results for input(s): INR, PROTIME in the last 168 hours. CBC: Recent Labs  Lab 04/03/24 1232 04/04/24 0409 04/06/24 0542 04/07/24 0103  WBC 5.8 5.1 3.8* 4.2  NEUTROABS 4.9  --   --   --   HGB 10.0* 9.7* 10.4* 11.1*  HCT 29.2* 27.8* 30.3* 33.3*  MCV 90.1 88.3 89.6 90.2  PLT 368 362 359 375   Cardiac Enzymes: No results for input(s): CKTOTAL, CKMB, CKMBINDEX, TROPONINI in the last 168 hours. BNP: Invalid input(s): POCBNP CBG: No results for input(s): GLUCAP in the last 168 hours. HbA1C: No results for input(s): HGBA1C in the last 72 hours. Urine analysis:    Component Value Date/Time   COLORURINE YELLOW 03/24/2024 1038   APPEARANCEUR CLEAR 03/24/2024 1038   LABSPEC 1.020 03/24/2024 1038   PHURINE 6.0 03/24/2024 1038   GLUCOSEU NEGATIVE 03/24/2024 1038   HGBUR MODERATE (A) 03/24/2024 1038   BILIRUBINUR NEGATIVE 03/24/2024 1038   KETONESUR 5 (A) 03/24/2024 1038   PROTEINUR 30 (A) 03/24/2024 1038   NITRITE NEGATIVE 03/24/2024 1038   LEUKOCYTESUR NEGATIVE 03/24/2024 1038    Sepsis Labs: @LABRCNTIP (procalcitonin:4,lacticidven:4) ) Recent Results (from the past 240 hours)  Expectorated Sputum Assessment w Gram Stain, Rflx to Resp Cult     Status: None   Collection Time: 04/04/24  4:30 PM   Specimen: Sputum  Result Value Ref Range Status   Specimen Description SPUTUM  Final   Special Requests Normal  Final   Sputum evaluation   Final    THIS  SPECIMEN IS ACCEPTABLE FOR SPUTUM CULTURE Performed at [facility], [address], [place], [place] [address]    Report Status 04/04/2024 FINAL  Final  Culture, Respiratory w Gram Stain     Status: None (Preliminary result)   Collection Time: 04/04/24  4:30 PM   Specimen: SPU  Result Value Ref Range Status   Specimen Description   Final    SPUTUM Performed at [facility], [address] [place], [place] [address]    Special Requests   Final    Normal Reflexed from [id number] Performed at [facility], [address], [place], [place] [address]    Gram Stain   Final    ABUNDANT SQUAMOUS EPITHELIAL CELLS PRESENT NO WBC SEEN FEW GRAM POSITIVE RODS RARE YEAST RARE GRAM POSITIVE COCCI RARE GRAM NEGATIVE RODS    Culture   Final    FEW Normal respiratory flora-no Staph aureus or Pseudomonas seen Performed at [facility], [address] [place], [place] [address]    Report Status PENDING  Incomplete  Resp panel by RT-PCR (RSV, Flu A&B, Covid) Nasal Mucosa     Status: Abnormal   Collection Time: 04/04/24  6:34 PM   Specimen: Nasal Mucosa; Nasal Swab  Result Value Ref Range Status   SARS Coronavirus 2 by RT PCR NEGATIVE NEGATIVE Final    Comment: (NOTE) SARS-CoV-2 target nucleic acids are NOT DETECTED.  The SARS-CoV-2 RNA is generally detectable in upper respiratory specimens during the acute phase of infection. The lowest concentration of SARS-CoV-2 viral copies this assay can detect is 138 copies/mL. A negative result does not preclude SARS-Cov-2 infection and should not be used as the sole basis  for treatment or other patient management decisions. A negative result may occur with  improper specimen collection/handling, submission of specimen other than nasopharyngeal swab, presence of viral mutation(s) within the areas targeted by this assay, and inadequate number of viral copies(<138 copies/mL). A negative result must be combined with clinical observations, patient history, and epidemiological information. The expected result is Negative.  Fact Sheet for Patients:  bloggercourse.com  Fact Sheet for Healthcare Providers:  seriousbroker.it  This test is no t yet approved or cleared by the [place]  FDA and  has been authorized for detection and/or diagnosis of SARS-CoV-2 by FDA under an Emergency Use Authorization (EUA). This EUA will remain  in effect (meaning this test can be used) for the duration of the COVID-19 declaration under Section 564(b)(1) of the Act, 21 U.S.C.section 360bbb-3(b)(1), unless the authorization is terminated  or revoked sooner.       Influenza A by PCR POSITIVE (A) NEGATIVE Final   Influenza B by PCR NEGATIVE NEGATIVE Final    Comment: (NOTE) The Xpert Xpress SARS-CoV-2/FLU/RSV plus assay is intended as an aid in the diagnosis of influenza from Nasopharyngeal swab specimens and should not be used as a sole basis for treatment. Nasal washings and aspirates are unacceptable for Xpert Xpress SARS-CoV-2/FLU/RSV testing.  Fact Sheet for Patients: bloggercourse.com  Fact Sheet for Healthcare Providers: seriousbroker.it  This test is not yet approved or cleared by the [place]  FDA and has been authorized for detection and/or diagnosis of SARS-CoV-2 by FDA under an Emergency Use Authorization (EUA). This EUA will remain in effect (meaning this test can be used) for the duration of the COVID-19 declaration under Section 564(b)(1) of the Act,  21 U.S.C. section 360bbb-3(b)(1), unless the authorization is terminated or revoked.     Resp Syncytial Virus by PCR NEGATIVE NEGATIVE  Final    Comment: (NOTE) Fact Sheet for Patients: bloggercourse.com  Fact Sheet for Healthcare Providers: seriousbroker.it  This test is not yet approved or cleared by the [place]  FDA and has been authorized for detection and/or diagnosis of SARS-CoV-2 by FDA under an Emergency Use Authorization (EUA). This EUA will remain in effect (meaning this test can be used) for the duration of the COVID-19 declaration under Section 564(b)(1) of the Act, 21 U.S.C. section 360bbb-3(b)(1), unless the authorization is terminated or revoked.  Performed at [facility], [address] [place], [place] [address]   Respiratory (~20 pathogens) panel by PCR     Status: Abnormal   Collection Time: 04/04/24  6:34 PM   Specimen: Nasopharyngeal Swab; Respiratory  Result Value Ref Range Status   Adenovirus NOT DETECTED NOT DETECTED Final   Coronavirus 229E NOT DETECTED NOT DETECTED Final    Comment: (NOTE) The Coronavirus on the Respiratory Panel, DOES NOT test for the novel  Coronavirus (2019 nCoV)    Coronavirus HKU1 NOT DETECTED NOT DETECTED Final   Coronavirus NL63 NOT DETECTED NOT DETECTED Final   Coronavirus OC43 NOT DETECTED NOT DETECTED Final   Metapneumovirus NOT DETECTED NOT DETECTED Final   Rhinovirus / Enterovirus NOT DETECTED NOT DETECTED Final   Influenza A H3 DETECTED (A) NOT DETECTED Final   Influenza B NOT DETECTED NOT DETECTED Final   Parainfluenza Virus 1 NOT DETECTED NOT DETECTED Final   Parainfluenza Virus 2 NOT DETECTED NOT DETECTED Final   Parainfluenza Virus 3 NOT DETECTED NOT DETECTED Final   Parainfluenza Virus 4 NOT DETECTED NOT DETECTED Final   Respiratory Syncytial Virus NOT DETECTED NOT DETECTED Final   Bordetella pertussis NOT DETECTED NOT DETECTED Final   Bordetella  Parapertussis NOT DETECTED NOT DETECTED Final   Chlamydophila pneumoniae NOT DETECTED NOT DETECTED Final   Mycoplasma pneumoniae NOT DETECTED NOT DETECTED Final    Comment: Performed at [facility], [address], [place], [place] [address]  MRSA Next Gen by PCR, Nasal     Status: None   Collection Time: 04/04/24  6:34 PM   Specimen: Nasal Mucosa; Nasal Swab  Result Value Ref Range Status   MRSA by PCR Next Gen NOT DETECTED NOT DETECTED Final    Comment: (NOTE) The GeneXpert MRSA Assay (FDA approved for NASAL specimens only), is one component of a comprehensive MRSA colonization surveillance program. It is not intended to diagnose MRSA infection nor to guide or monitor treatment for MRSA infections. Test performance is not FDA approved in patients less than 79 years old. Performed at [facility], [address], [place], [place] [address]      Scheduled Meds:  apixaban   5 mg Oral BID   Chlorhexidine  Gluconate Cloth  6 each Topical Daily   diltiazem   30 mg Oral Q6H   furosemide   80 mg Intravenous BID   ipratropium-albuterol   3 mL Nebulization Q6H   oseltamivir   30 mg Oral BID   Continuous Infusions:  Procedures/Studies: DG CHEST PORT 1 VIEW Result Date: 04/05/2024 CLINICAL DATA:  Acute on chronic heart failure. Influenza bronchitis. EXAM: PORTABLE CHEST 1 VIEW COMPARISON:  04/03/2024 FINDINGS: Mildly decreased inspiration. Stable enlarged cardiac silhouette and mildly tortuous thoracic aorta. Mild chronic interstitial prominence. Minimal left lower lobe atelectasis. Diffuse osteopenia. IMPRESSION: 1. Minimal left lower lobe atelectasis. 2. Stable cardiomegaly and mild chronic interstitial disease. Electronically Signed   By: Elspeth Bathe M.D.   On: 04/05/2024 13:45   CT Angio Chest PE W and/or  Wo Contrast Result Date: 04/03/2024 EXAM: CTA CHEST 04/03/2024 06:44:08 PM TECHNIQUE: CTA of the chest was performed without and with the administration of 75 mL of iohexol  (OMNIPAQUE )  350 MG/ML injection. Multiplanar reformatted images are provided for review. MIP images are provided for review. Automated exposure control, iterative reconstruction, and/or weight based adjustment of the mA/kV was utilized to reduce the radiation dose to as low as reasonably achievable. COMPARISON: None available. CLINICAL HISTORY: Pulmonary embolism (PE) suspected, high prob. FINDINGS: PULMONARY ARTERIES: Pulmonary arteries are adequately opacified for evaluation. No acute pulmonary embolus. Main pulmonary artery is normal in caliber. MEDIASTINUM: The heart and pericardium demonstrate no acute abnormality. There are atherosclerotic calcifications of the aorta. LYMPH NODES: Large right hilar lymph node measuring 14 mm. No mediastinal or axillary lymphadenopathy. LUNGS AND PLEURA: There is a trace of bilateral pleural effusions, left greater than right. No pneumothorax. There is atelectasis in the lingula. There is some scarring in both lung apices. There are calcified granulomas in the right upper lobe. The lungs are otherwise clear. UPPER ABDOMEN: Limited images of the upper abdomen are unremarkable. SOFT TISSUES AND BONES: No acute bone or soft tissue abnormality. IMPRESSION: 1. No pulmonary embolism. 2. Large right hilar lymph node measuring 14 mm. 3. Trace bilateral pleural effusions, left greater than right. Electronically signed by: Greig Pique MD 04/03/2024 07:28 PM EST RP Workstation: [id number]   DG Foot Complete Left Result Date: 04/03/2024 CLINICAL DATA:  Bruising and edema of left foot. EXAM: LEFT FOOT - COMPLETE 3+ VIEW COMPARISON:  None Available. FINDINGS: There is no evidence of acute fracture or dislocation. There is no evidence of arthropathy or other focal bone abnormality. Soft tissues are unremarkable. IMPRESSION: Negative. Electronically Signed   By: Marcey Moan M.D.   On: 04/03/2024 16:45   DG Chest 2 View Result Date: 04/03/2024 CLINICAL DATA:  Lower extremity swelling. EXAM: CHEST  - 2 VIEW COMPARISON:  March 24, 2024 FINDINGS: The cardiac silhouette is mildly enlarged and unchanged in size. Mild, chronic appearing increased interstitial lung markings are seen without evidence of focal consolidation or pneumothorax. A small left pleural effusion is suspected. A deformity of indeterminate age is seen along the distal right clavicle. Multilevel degenerative changes are present throughout the thoracic spine. IMPRESSION: 1. Chronic appearing increased interstitial lung markings without evidence of acute or active cardiopulmonary disease. 2. Small left pleural effusion. 3. Deformity of indeterminate age along the distal right clavicle. Correlation with physical examination is recommended to determine the presence of point tenderness. Electronically Signed   By: Suzen Dials M.D.   On: 04/03/2024 13:11   US  Venous Img Upper Uni Left (DVT) Result Date: 03/27/2024 CLINICAL DATA:  Left upper extremity edema EXAM: LEFT UPPER EXTREMITY VENOUS DOPPLER ULTRASOUND TECHNIQUE: Gray-scale sonography with graded compression, as well as color Doppler and duplex ultrasound were performed to evaluate the upper extremity deep venous system from the level of the subclavian vein and including the jugular, axillary, basilic, radial, ulnar and upper cephalic vein. Spectral Doppler was utilized to evaluate flow at rest and with distal augmentation maneuvers. COMPARISON:  None Available. FINDINGS: Contralateral Subclavian Vein: Respiratory phasicity is normal and symmetric with the symptomatic side. No evidence of thrombus. Normal compressibility. Internal Jugular Vein: No evidence of thrombus. Normal compressibility, respiratory phasicity and response to augmentation. Subclavian Vein: No evidence of thrombus. Normal compressibility, respiratory phasicity and response to augmentation. Axillary Vein: No evidence of thrombus. Normal compressibility, respiratory phasicity and response to augmentation. Cephalic  Vein: Left cephalic  vein at the antecubital fossa demonstrates ill-defined hypoechoic intraluminal thrombus and is noncompressible compatible with superficial thrombosis/thrombophlebitis. Basilic Vein: No evidence of thrombus. Normal compressibility, respiratory phasicity and response to augmentation. Brachial Veins: No evidence of thrombus. Normal compressibility, respiratory phasicity and response to augmentation. Radial Veins: No evidence of thrombus. Normal compressibility, respiratory phasicity and response to augmentation. Ulnar Veins: No evidence of thrombus. Normal compressibility, respiratory phasicity and response to augmentation. IMPRESSION: 1. Negative for left upper extremity DVT. 2. Positive for left cephalic vein superficial thrombosis/thrombophlebitis at the antecubital fossa. Electronically Signed   By: CHRISTELLA.  Shick M.D.   On: 03/27/2024 10:30   ECHOCARDIOGRAM COMPLETE Result Date: 03/25/2024    ECHOCARDIOGRAM REPORT   Patient Name:   MAX ROMANO Date of Exam: 03/25/2024 Medical Rec #:  [id number]           Height:       67.0 in Accession #:    [id number]          Weight:       188.3 lb Date of Birth:  04-12-1934           BSA:          1.971 m Patient Age:    89 years            BP:           126/57 mmHg Patient Gender: F                   HR:           102 bpm. Exam Location:  Inpatient Procedure: 2D Echo, Cardiac Doppler and Color Doppler (Both Spectral and Color            Flow Doppler were utilized during procedure). Indications:    Atrial fibrillation  History:        Patient has no prior history of Echocardiogram examinations.                 Arrythmias:Atrial Fibrillation; Risk Factors:Hypertension.  Sonographer:    Philomena Daring Referring Phys: [email or web address] Chirstine Defrain IMPRESSIONS  1. Left ventricular ejection fraction, by estimation, is 65 to 70%. The left ventricle has normal function. The left ventricle has no regional wall motion abnormalities. There is mild concentric left ventricular  hypertrophy. Left ventricular diastolic parameters are indeterminate.  2. Right ventricular systolic function is normal. The right ventricular size is normal.  3. The mitral valve is normal in structure. No evidence of mitral valve regurgitation. No evidence of mitral stenosis.  4. The aortic valve was not well visualized. There is mild calcification of the aortic valve. Aortic valve regurgitation is mild. Aortic valve sclerosis is present, with no evidence of aortic valve stenosis.  5. The inferior vena cava is normal in size with greater than 50% respiratory variability, suggesting right atrial pressure of 3 mmHg. Comparison(s): No prior Echocardiogram. FINDINGS  Left Ventricle: Left ventricular ejection fraction, by estimation, is 65 to 70%. The left ventricle has normal function. The left ventricle has no regional wall motion abnormalities. The left ventricular internal cavity size was normal in size. There is  mild concentric left ventricular hypertrophy. Left ventricular diastolic parameters are indeterminate. Right Ventricle: The right ventricular size is normal. No increase in right ventricular wall thickness. Right ventricular systolic function is normal. Left Atrium: Left atrial size was normal in size. Right Atrium: Right atrial size was normal in size. Pericardium: There is no evidence of pericardial effusion. Mitral Valve: The  mitral valve is normal in structure. No evidence of mitral valve regurgitation. No evidence of mitral valve stenosis. Tricuspid Valve: The tricuspid valve is normal in structure. Tricuspid valve regurgitation is mild . No evidence of tricuspid stenosis. Aortic Valve: The aortic valve was not well visualized. There is mild calcification of the aortic valve. Aortic valve regurgitation is mild. Aortic regurgitation PHT measures 465 msec. Aortic valve sclerosis is present, with no evidence of aortic valve stenosis. Pulmonic Valve: The pulmonic valve was normal in structure. Pulmonic  valve regurgitation is mild. No evidence of pulmonic stenosis. Aorta: The aortic root and ascending aorta are structurally normal, with no evidence of dilitation. Venous: The inferior vena cava is normal in size with greater than 50% respiratory variability, suggesting right atrial pressure of 3 mmHg. IAS/Shunts: No atrial level shunt detected by color flow Doppler.  LEFT VENTRICLE PLAX 2D LVIDd:         3.90 cm   Diastology LVIDs:         2.60 cm   LV e' medial:    10.23 cm/s LV PW:         1.10 cm   LV E/e' medial:  9.5 LV IVS:        1.10 cm   LV e' lateral:   14.80 cm/s LVOT diam:     1.80 cm   LV E/e' lateral: 6.5 LV SV:         42 LV SV Index:   22 LVOT Area:     2.54 cm  RIGHT VENTRICLE             IVC RV Basal diam:  2.90 cm     IVC diam: 1.40 cm RV Mid diam:    2.20 cm RV S prime:     14.00 cm/s TAPSE (M-mode): 2.2 cm LEFT ATRIUM             Index        RIGHT ATRIUM           Index LA diam:        3.30 cm 1.67 cm/m   RA Area:     15.70 cm LA Vol (A2C):   46.8 ml 23.74 ml/m  RA Volume:   37.00 ml  18.77 ml/m LA Vol (A4C):   42.7 ml 21.66 ml/m LA Biplane Vol: 48.7 ml 24.71 ml/m  AORTIC VALVE LVOT Vmax:   129.33 cm/s LVOT Vmean:  83.367 cm/s LVOT VTI:    0.167 m AI PHT:      465 msec  AORTA Ao Root diam: 3.20 cm Ao Asc diam:  3.20 cm MITRAL VALVE               TRICUSPID VALVE MV Area (PHT): 4.67 cm    TR Peak grad:   30.9 mmHg MV Decel Time: 163 msec    TR Vmax:        278.00 cm/s MV E velocity: 96.90 cm/s                            SHUNTS                            Systemic VTI:  0.17 m                            Systemic Diam: 1.80 cm [employer]  MD Electronically signed by Stanly Leavens MD Signature Date/Time: 03/25/2024/12:13:57 PM    Final    DG Chest 1 View Result Date: 03/24/2024 CLINICAL DATA:  Weakness and fall. EXAM: CHEST  1 VIEW COMPARISON:  None available. FINDINGS: The heart is mildly enlarged. No pulmonary vascular congestion. Mediastinal silhouette within normal  limits. Lungs are clear. Deformity of the lateral RIGHT clavicle suspicious for fracture. IMPRESSION: 1. Mild cardiomegaly. 2. Deformity of the lateral RIGHT clavicle suspicious for fracture. Please correlate for focal tenderness. Electronically Signed   By: Aliene Lloyd M.D.   On: 03/24/2024 14:04   DG Knee Complete 4 Views Right Result Date: 03/24/2024 EXAM: 4 VIEW(S) XRAY OF THE KNEE 03/24/2024 11:07:00 AM COMPARISON: None available. CLINICAL HISTORY: fall FINDINGS: BONES AND JOINTS: No acute fracture. No malalignment. No significant joint effusion. Mild lateral compartment joint space narrowing. Marginal osteophytosis and subchondral sclerosis of the lateral compartment. Additional subtle marginal osteophytosis of the medial compartment. SOFT TISSUES: Mild diffuse soft tissue swelling. IMPRESSION: 1. No acute fracture or dislocation. 2. Mild diffuse soft tissue swelling. 3. Lateral compartment predominant osteoarthritis. Electronically signed by: Donnice Mania MD 03/24/2024 11:27 AM EST RP Workstation: [id number]   CT Cervical Spine Wo Contrast Result Date: 03/24/2024 EXAM: CT CERVICAL SPINE WITHOUT CONTRAST 03/24/2024 10:59:54 AM TECHNIQUE: CT of the cervical spine was performed without the administration of intravenous contrast. Multiplanar reformatted images are provided for review. Automated exposure control, iterative reconstruction, and/or weight based adjustment of the mA/kV was utilized to reduce the radiation dose to as low as reasonably achievable. COMPARISON: None available. CLINICAL HISTORY: Neck trauma (Age >= 65y) FINDINGS: BONES AND ALIGNMENT: Straightening of the normal cervical lordosis. Trace degenerative anterolisthesis of C2 on C3 and C3 on C4. Additional trace degenerative anterolisthesis of C7 on T1. No acute fracture or traumatic malalignment. DEGENERATIVE CHANGES: Moderate disc space narrowing at C4-C5. Additional disc space narrowing at C5-C6 and C6-C7. Mild degenerative endplate  osteophytes at multiple levels. There are disc osteophyte complexes at multiple levels without evidence of high grade osseous spinal canal stenosis. Facet arthrosis and uncovertebral hypertrophy at multiple levels. SOFT TISSUES: No prevertebral soft tissue swelling. LUNGS: Scarring and atelectasis in the right lung apex. IMPRESSION: 1. No evidence of acute traumatic injury. 2. Degenerative changes as above. Electronically signed by: Donnice Mania MD 03/24/2024 11:25 AM EST RP Workstation: [id number]   CT Head Wo Contrast Result Date: 03/24/2024 EXAM: CT HEAD WITHOUT CONTRAST 03/24/2024 10:59:54 AM TECHNIQUE: CT of the head was performed without the administration of intravenous contrast. Automated exposure control, iterative reconstruction, and/or weight based adjustment of the mA/kV was utilized to reduce the radiation dose to as low as reasonably achievable. COMPARISON: None available. CLINICAL HISTORY: Head trauma, minor (Age >= 65y). FINDINGS: BRAIN AND VENTRICLES: No acute hemorrhage. 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 Mild chronic microvascular ischemic change. No evidence of acute infarct. No hydrocephalus. No extra-axial collection. No mass effect or midline shift. ORBITS: Bilateral lens replacement noted. SINUSES: Trace right mastoid effusion. SOFT TISSUES AND SKULL: Right periorbital soft tissue swelling. No skull fracture. IMPRESSION: 1. No acute intracranial abnormality. 2. Right periorbital soft tissue swelling. 3. Focal encephalomalacia along the anterolateral left temporal lobe, suggestive of small remote infarct or sequelae of prior trauma. Electronically signed by: Donnice Mania MD 03/24/2024 11:18 AM EST RP Workstation: [id number]    Alm Schneider, DO  [facility]  If 7PM-7AM, please contact night-coverage [email or web address] Password [facility] 04/07/2024, 5:40  PM   LOS: 4 days   "

## 2024-04-07 NOTE — Plan of Care (Signed)

## 2024-04-08 ENCOUNTER — Inpatient Hospital Stay (HOSPITAL_COMMUNITY)

## 2024-04-08 DIAGNOSIS — I5033 Acute on chronic diastolic (congestive) heart failure: Secondary | ICD-10-CM | POA: Diagnosis not present

## 2024-04-08 DIAGNOSIS — E871 Hypo-osmolality and hyponatremia: Secondary | ICD-10-CM | POA: Diagnosis not present

## 2024-04-08 DIAGNOSIS — I4891 Unspecified atrial fibrillation: Secondary | ICD-10-CM | POA: Diagnosis not present

## 2024-04-08 DIAGNOSIS — J111 Influenza due to unidentified influenza virus with other respiratory manifestations: Secondary | ICD-10-CM | POA: Diagnosis not present

## 2024-04-08 LAB — MAGNESIUM: Magnesium: 2 mg/dL (ref 1.7–2.4)

## 2024-04-08 LAB — BASIC METABOLIC PANEL WITH GFR
Anion gap: 13 (ref 5–15)
BUN: 16 mg/dL (ref 8–23)
CO2: 28 mmol/L (ref 22–32)
Calcium: 9.1 mg/dL (ref 8.9–10.3)
Chloride: 91 mmol/L — ABNORMAL LOW (ref 98–111)
Creatinine, Ser: 0.87 mg/dL (ref 0.44–1.00)
GFR, Estimated: >60 mL/min
Glucose, Bld: 90 mg/dL (ref 70–99)
Potassium: 4 mmol/L (ref 3.5–5.1)
Sodium: 131 mmol/L — ABNORMAL LOW (ref 135–145)

## 2024-04-08 LAB — CULTURE, RESPIRATORY W GRAM STAIN
Culture: NORMAL
Special Requests: NORMAL

## 2024-04-08 MED ORDER — DILTIAZEM HCL ER COATED BEADS 180 MG PO CP24
180.0000 mg | ORAL_CAPSULE | Freq: Every day | ORAL | Status: DC
  Administered 2024-04-08 – 2024-04-14 (×7): 180 mg via ORAL
  Filled 2024-04-08 (×7): qty 1

## 2024-04-08 MED ORDER — ORAL CARE MOUTH RINSE: 15.0000 mL | OROMUCOSAL | Status: DC | PRN

## 2024-04-08 MED ORDER — TORSEMIDE 20 MG PO TABS
80.0000 mg | ORAL_TABLET | Freq: Every day | ORAL | Status: DC
  Administered 2024-04-08: 80 mg via ORAL
  Filled 2024-04-08: qty 4

## 2024-04-08 NOTE — Progress Notes (Signed)
 "          PROGRESS NOTE  Glenda Nguyen FMW:[id number] DOB: 10-28-1934 DOA: 04/03/2024 PCP: Vick Lurie, FNP (Inactive)  Brief History:  89 year old female with a history of HFpEF, paroxysmal atrial fibrillation, hypertension, hyperlipidemia, prediabetes presenting with worsening lower extremity edema and coughing.  The patient was recently admitted to the hospital from 03/24/2024 to 03/31/2024 for hyponatremia, acute HFpEF, and new onset atrial fibrillation with RVR.  She was discharged home with torsemide  30 mg daily, Cardizem  CD 120 milligrams daily, and metoprolol  12.5 mg twice daily. The patient returns because of worsening lower extremity edema and cough.  She has had some difficulty ambulating secondary to pain associated with swelling of her legs and feet.  She denies any fevers, chills, chest pain, shortness breath, hemoptysis, nausea, vomiting, diarrhea, abdominal pain.  In the ED, she was afebrile and hemodynamically stable.  Initially she was tachycardic in the 120s.  She was started on IV furosemide .   Assessment/Plan: Acute on chronic HFpEF - 04/03/2024 CTA chest--negative PE, trace bilateral pleural effusions, left greater than right, biapical scarring,RUL calcified granuloma - 03/25/2024 echo EF 65 to 70%, no WMA, normal RVF - Continue lasix  to 40 mg IV bid>>increase to 80 mg BID - 04/08/24 transition to torsemide  80 mg daily   Persistent atrial fibrillation with RVR - Continue apixaban  - 04/04/24--developed RVR>>started IV diltiazem  - 04/05/24--transition back to po diltiazem  - 03/25/24 Echo EF 65-70%, no WMA, normal RVF - 04/08/24--transition to Cardizem  CD 180 mg   Hyponatremia -04/05/24--Na down 127>>122>>131 (after tolvaptan ) - due to volume overload and SIAHD -required tolvaptan  last hospitalization -nephrology consult appreciated>>redose tovalptan 1/9 -check serum Osm--266 -urine Osm 581 -urine Na 69 - Na stable 130-131 last 24-48 hours   Influenza  Bronchitis -1/9 personally reviewed CXR--increased interstitial markings -PCT 0.14 - started oseltamivir  1/8   Elevated troponin - Secondary to demand ischemia - No chest pain presently - Personally reviewed EKG--atrial fibrillation with nonspecific T changes   Large right hilar lymph node measuring 14 mm -Possibly reactive, will need to be monitored - Outpatient surveillance and possible PET scan   Generalized weakness PT OT evaluation>>HHPT Fall precautions.   anion gap metabolic acidosis Serum bicarb 20, anion gap 17 on day of admission -improved   Hypokalemia - Repleted - Check magnesium  2.0                 Family Communication: no  Family at bedside   Consultants:  none   Code Status:  FULL    DVT Prophylaxis:  apixaban      Procedures: As Listed in Progress Note Above   Antibiotics: Ceftriaxone  1/7>>1/8 Azithro 1/7        Subjective: Pt states she is breathing better.  Denies cp, sob, n/v/d.  She still has dry cough.  Denies f/c abd pain  Objective: Vitals:   04/08/24 0500 04/08/24 0517 04/08/24 0600 04/08/24 0700  BP: 126/84  (!) 141/62 124/64  Pulse: 93 85 88 82  Resp: 19 18 16 15   Temp:    97.9 F (36.6 C)  TempSrc:    Oral  SpO2: 95% 94% 95% 94%  Weight:  58.1 kg    Height:        Intake/Output Summary (Last 24 hours) at 04/08/2024 0753 Last data filed at 04/08/2024 0555 Gross per 24 hour  Intake 240 ml  Output 3025 ml  Net -2785 ml   Weight change: -0.8 kg Exam:  General:  Pt is alert, follows commands  appropriately, not in acute distress HEENT: No icterus, No thrush, No neck mass, [place]/AT Cardiovascular: IRRR, S1/S2, no rubs, no gallops Respiratory: scattered bilateral crackles.  No wheeze Abdomen: Soft/+BS, non tender, non distended, no guarding Extremities: trace LE edema, No lymphangitis, No petechiae, No rashes, no synovitis   Data Reviewed: I have personally reviewed following labs and imaging studies Basic  Metabolic Panel: Recent Labs  Lab 04/04/24 0409 04/05/24 0429 04/05/24 0905 04/06/24 0542 04/06/24 1502 04/06/24 1516 04/07/24 0103 04/07/24 0945 04/07/24 1624 04/08/24 0413  NA 127* 119*   < > 123* 129* 129* 132* 131* 130* 131*  K 3.2* 3.3*   < > 4.7 4.3  --   --  3.9 3.9 4.0  CL 86* 80*   < > 87* 89*  --   --  90* 90* 91*  CO2 33* 31   < > 22 25  --   --  33* 26 28  GLUCOSE 95 116*   < > 81 97  --   --  137* 102* 90  BUN 16 15   < > 18 18  --   --  14 19 16   CREATININE 0.75 0.79   < > 0.72 0.85  --   --  0.85 0.89 0.87  CALCIUM 8.1* 8.4*   < > 8.6* 8.9  --   --  9.1 8.9 9.1  MG 2.0 2.1  --  2.0  --   --   --   --   --  2.0  PHOS 3.9  --   --   --   --   --   --   --   --   --    < > = values in this interval not displayed.   Liver Function Tests: Recent Labs  Lab 04/06/24 0818  AST 31  ALT 23  ALKPHOS 86  BILITOT 0.2  PROT 6.9  ALBUMIN 3.5   No results for input(s): LIPASE, AMYLASE in the last 168 hours. No results for input(s): AMMONIA in the last 168 hours. Coagulation Profile: No results for input(s): INR, PROTIME in the last 168 hours. CBC: Recent Labs  Lab 04/03/24 1232 04/04/24 0409 04/06/24 0542 04/07/24 0103  WBC 5.8 5.1 3.8* 4.2  NEUTROABS 4.9  --   --   --   HGB 10.0* 9.7* 10.4* 11.1*  HCT 29.2* 27.8* 30.3* 33.3*  MCV 90.1 88.3 89.6 90.2  PLT 368 362 359 375   Cardiac Enzymes: No results for input(s): CKTOTAL, CKMB, CKMBINDEX, TROPONINI in the last 168 hours. BNP: Invalid input(s): POCBNP CBG: No results for input(s): GLUCAP in the last 168 hours. HbA1C: No results for input(s): HGBA1C in the last 72 hours. Urine analysis:    Component Value Date/Time   COLORURINE YELLOW 03/24/2024 1038   APPEARANCEUR CLEAR 03/24/2024 1038   LABSPEC 1.020 03/24/2024 1038   PHURINE 6.0 03/24/2024 1038   GLUCOSEU NEGATIVE 03/24/2024 1038   HGBUR MODERATE (A) 03/24/2024 1038   BILIRUBINUR NEGATIVE 03/24/2024 1038   KETONESUR 5  (A) 03/24/2024 1038   PROTEINUR 30 (A) 03/24/2024 1038   NITRITE NEGATIVE 03/24/2024 1038   LEUKOCYTESUR NEGATIVE 03/24/2024 1038   Sepsis Labs: @LABRCNTIP (procalcitonin:4,lacticidven:4) ) Recent Results (from the past 240 hours)  Expectorated Sputum Assessment w Gram Stain, Rflx to Resp Cult     Status: None   Collection Time: 04/04/24  4:30 PM   Specimen: Sputum  Result Value Ref Range Status   Specimen Description SPUTUM  Final  Special Requests Normal  Final   Sputum evaluation   Final    THIS SPECIMEN IS ACCEPTABLE FOR SPUTUM CULTURE Performed at [facility], [address], [place], [place] [address]    Report Status 04/04/2024 FINAL  Final  Culture, Respiratory w Gram Stain     Status: None (Preliminary result)   Collection Time: 04/04/24  4:30 PM   Specimen: SPU  Result Value Ref Range Status   Specimen Description   Final    SPUTUM Performed at [facility], [address], [place], [place] [address]    Special Requests   Final    Normal Reflexed from [phone] Performed at [facility], [address] [place], [place] [address]    Gram Stain   Final    ABUNDANT SQUAMOUS EPITHELIAL CELLS PRESENT NO WBC SEEN FEW GRAM POSITIVE RODS RARE YEAST RARE GRAM POSITIVE COCCI RARE GRAM NEGATIVE RODS    Culture   Final    FEW Normal respiratory flora-no Staph aureus or Pseudomonas seen Performed at [facility], [address] [place], [place] [address]    Report Status PENDING  Incomplete  Resp panel by RT-PCR (RSV, Flu A&B, Covid) Nasal Mucosa     Status: Abnormal   Collection Time: 04/04/24  6:34 PM   Specimen: Nasal Mucosa; Nasal Swab  Result Value Ref Range Status   SARS Coronavirus 2 by RT PCR NEGATIVE NEGATIVE Final    Comment: (NOTE) SARS-CoV-2 target nucleic acids are NOT DETECTED.  The SARS-CoV-2 RNA is generally detectable in upper respiratory specimens during the acute phase of infection. The lowest concentration of SARS-CoV-2 viral copies this  assay can detect is 138 copies/mL. A negative result does not preclude SARS-Cov-2 infection and should not be used as the sole basis for treatment or other patient management decisions. A negative result may occur with  improper specimen collection/handling, submission of specimen other than nasopharyngeal swab, presence of viral mutation(s) within the areas targeted by this assay, and inadequate number of viral copies(<138 copies/mL). A negative result must be combined with clinical observations, patient history, and epidemiological information. The expected result is Negative.  Fact Sheet for Patients:  bloggercourse.com  Fact Sheet for Healthcare Providers:  seriousbroker.it  This test is no t yet approved or cleared by the [place]  FDA and  has been authorized for detection and/or diagnosis of SARS-CoV-2 by FDA under an Emergency Use Authorization (EUA). This EUA will remain  in effect (meaning this test can be used) for the duration of the COVID-19 declaration under Section 564(b)(1) of the Act, 21 U.S.C.section 360bbb-3(b)(1), unless the authorization is terminated  or revoked sooner.       Influenza A by PCR POSITIVE (A) NEGATIVE Final   Influenza B by PCR NEGATIVE NEGATIVE Final    Comment: (NOTE) The Xpert Xpress SARS-CoV-2/FLU/RSV plus assay is intended as an aid in the diagnosis of influenza from Nasopharyngeal swab specimens and should not be used as a sole basis for treatment. Nasal washings and aspirates are unacceptable for Xpert Xpress SARS-CoV-2/FLU/RSV testing.  Fact Sheet for Patients: bloggercourse.com  Fact Sheet for Healthcare Providers: seriousbroker.it  This test is not yet approved or cleared by the [place]  FDA and has been authorized for detection and/or diagnosis of SARS-CoV-2 by FDA under an Emergency Use Authorization (EUA). This EUA  will remain in effect (meaning this test can be used) for the duration of the COVID-19 declaration under Section 564(b)(1) of the Act, 21 U.S.C. section 360bbb-3(b)(1), unless the  authorization is terminated or revoked.     Resp Syncytial Virus by PCR NEGATIVE NEGATIVE Final    Comment: (NOTE) Fact Sheet for Patients: bloggercourse.com  Fact Sheet for Healthcare Providers: seriousbroker.it  This test is not yet approved or cleared by the [place]  FDA and has been authorized for detection and/or diagnosis of SARS-CoV-2 by FDA under an Emergency Use Authorization (EUA). This EUA will remain in effect (meaning this test can be used) for the duration of the COVID-19 declaration under Section 564(b)(1) of the Act, 21 U.S.C. section 360bbb-3(b)(1), unless the authorization is terminated or revoked.  Performed at [facility], [address] [place], [place] [address]   Respiratory (~20 pathogens) panel by PCR     Status: Abnormal   Collection Time: 04/04/24  6:34 PM   Specimen: Nasopharyngeal Swab; Respiratory  Result Value Ref Range Status   Adenovirus NOT DETECTED NOT DETECTED Final   Coronavirus 229E NOT DETECTED NOT DETECTED Final    Comment: (NOTE) The Coronavirus on the Respiratory Panel, DOES NOT test for the novel  Coronavirus (2019 nCoV)    Coronavirus HKU1 NOT DETECTED NOT DETECTED Final   Coronavirus NL63 NOT DETECTED NOT DETECTED Final   Coronavirus OC43 NOT DETECTED NOT DETECTED Final   Metapneumovirus NOT DETECTED NOT DETECTED Final   Rhinovirus / Enterovirus NOT DETECTED NOT DETECTED Final   Influenza A H3 DETECTED (A) NOT DETECTED Final   Influenza B NOT DETECTED NOT DETECTED Final   Parainfluenza Virus 1 NOT DETECTED NOT DETECTED Final   Parainfluenza Virus 2 NOT DETECTED NOT DETECTED Final   Parainfluenza Virus 3 NOT DETECTED NOT DETECTED Final   Parainfluenza Virus 4 NOT DETECTED NOT DETECTED Final    Respiratory Syncytial Virus NOT DETECTED NOT DETECTED Final   Bordetella pertussis NOT DETECTED NOT DETECTED Final   Bordetella Parapertussis NOT DETECTED NOT DETECTED Final   Chlamydophila pneumoniae NOT DETECTED NOT DETECTED Final   Mycoplasma pneumoniae NOT DETECTED NOT DETECTED Final    Comment: Performed at [facility], [address], [place], [place] [address]  MRSA Next Gen by PCR, Nasal     Status: None   Collection Time: 04/04/24  6:34 PM   Specimen: Nasal Mucosa; Nasal Swab  Result Value Ref Range Status   MRSA by PCR Next Gen NOT DETECTED NOT DETECTED Final    Comment: (NOTE) The GeneXpert MRSA Assay (FDA approved for NASAL specimens only), is one component of a comprehensive MRSA colonization surveillance program. It is not intended to diagnose MRSA infection nor to guide or monitor treatment for MRSA infections. Test performance is not FDA approved in patients less than 15 years old. Performed at [facility], [address], [place], [place] [address]      Scheduled Meds:  apixaban   5 mg Oral BID   Chlorhexidine  Gluconate Cloth  6 each Topical Daily   diltiazem   180 mg Oral Daily   ipratropium-albuterol   3 mL Nebulization TID   oseltamivir   30 mg Oral BID   torsemide   80 mg Oral Daily   Continuous Infusions:  Procedures/Studies: DG CHEST PORT 1 VIEW Result Date: 04/05/2024 CLINICAL DATA:  Acute on chronic heart failure. Influenza bronchitis. EXAM: PORTABLE CHEST 1 VIEW COMPARISON:  04/03/2024 FINDINGS: Mildly decreased inspiration. Stable enlarged cardiac silhouette and mildly tortuous thoracic aorta. Mild chronic interstitial prominence. Minimal left lower lobe atelectasis. Diffuse osteopenia. IMPRESSION: 1. Minimal left lower lobe atelectasis. 2. Stable cardiomegaly and mild chronic interstitial disease. Electronically Signed   By: Elspeth  Robynn M.D.   On: 04/05/2024 13:45   CT Angio Chest PE W and/or Wo Contrast Result Date: 04/03/2024 EXAM: CTA CHEST  04/03/2024 06:44:08 PM TECHNIQUE: CTA of the chest was performed without and with the administration of 75 mL of iohexol  (OMNIPAQUE ) 350 MG/ML injection. Multiplanar reformatted images are provided for review. MIP images are provided for review. Automated exposure control, iterative reconstruction, and/or weight based adjustment of the mA/kV was utilized to reduce the radiation dose to as low as reasonably achievable. COMPARISON: None available. CLINICAL HISTORY: Pulmonary embolism (PE) suspected, high prob. FINDINGS: PULMONARY ARTERIES: Pulmonary arteries are adequately opacified for evaluation. No acute pulmonary embolus. Main pulmonary artery is normal in caliber. MEDIASTINUM: The heart and pericardium demonstrate no acute abnormality. There are atherosclerotic calcifications of the aorta. LYMPH NODES: Large right hilar lymph node measuring 14 mm. No mediastinal or axillary lymphadenopathy. LUNGS AND PLEURA: There is a trace of bilateral pleural effusions, left greater than right. No pneumothorax. There is atelectasis in the lingula. There is some scarring in both lung apices. There are calcified granulomas in the right upper lobe. The lungs are otherwise clear. UPPER ABDOMEN: Limited images of the upper abdomen are unremarkable. SOFT TISSUES AND BONES: No acute bone or soft tissue abnormality. IMPRESSION: 1. No pulmonary embolism. 2. Large right hilar lymph node measuring 14 mm. 3. Trace bilateral pleural effusions, left greater than right. Electronically signed by: Greig Pique MD 04/03/2024 07:28 PM EST RP Workstation: [id number]   DG Foot Complete Left Result Date: 04/03/2024 CLINICAL DATA:  Bruising and edema of left foot. EXAM: LEFT FOOT - COMPLETE 3+ VIEW COMPARISON:  None Available. FINDINGS: There is no evidence of acute fracture or dislocation. There is no evidence of arthropathy or other focal bone abnormality. Soft tissues are unremarkable. IMPRESSION: Negative. Electronically Signed   By: Marcey Moan M.D.   On: 04/03/2024 16:45   DG Chest 2 View Result Date: 04/03/2024 CLINICAL DATA:  Lower extremity swelling. EXAM: CHEST - 2 VIEW COMPARISON:  March 24, 2024 FINDINGS: The cardiac silhouette is mildly enlarged and unchanged in size. Mild, chronic appearing increased interstitial lung markings are seen without evidence of focal consolidation or pneumothorax. A small left pleural effusion is suspected. A deformity of indeterminate age is seen along the distal right clavicle. Multilevel degenerative changes are present throughout the thoracic spine. IMPRESSION: 1. Chronic appearing increased interstitial lung markings without evidence of acute or active cardiopulmonary disease. 2. Small left pleural effusion. 3. Deformity of indeterminate age along the distal right clavicle. Correlation with physical examination is recommended to determine the presence of point tenderness. Electronically Signed   By: Suzen Dials M.D.   On: 04/03/2024 13:11   US  Venous Img Upper Uni Left (DVT) Result Date: 03/27/2024 CLINICAL DATA:  Left upper extremity edema EXAM: LEFT UPPER EXTREMITY VENOUS DOPPLER ULTRASOUND TECHNIQUE: Gray-scale sonography with graded compression, as well as color Doppler and duplex ultrasound were performed to evaluate the upper extremity deep venous system from the level of the subclavian vein and including the jugular, axillary, basilic, radial, ulnar and upper cephalic vein. Spectral Doppler was utilized to evaluate flow at rest and with distal augmentation maneuvers. COMPARISON:  None Available. FINDINGS: Contralateral Subclavian Vein: Respiratory phasicity is normal and symmetric with the symptomatic side. No evidence of thrombus. Normal compressibility. Internal Jugular Vein: No evidence of thrombus. Normal compressibility, respiratory phasicity and response to augmentation. Subclavian Vein: No evidence of thrombus. Normal compressibility, respiratory phasicity and response to  augmentation. Axillary Vein: No  evidence of thrombus. Normal compressibility, respiratory phasicity and response to augmentation. Cephalic Vein: Left cephalic vein at the antecubital fossa demonstrates ill-defined hypoechoic intraluminal thrombus and is noncompressible compatible with superficial thrombosis/thrombophlebitis. Basilic Vein: No evidence of thrombus. Normal compressibility, respiratory phasicity and response to augmentation. Brachial Veins: No evidence of thrombus. Normal compressibility, respiratory phasicity and response to augmentation. Radial Veins: No evidence of thrombus. Normal compressibility, respiratory phasicity and response to augmentation. Ulnar Veins: No evidence of thrombus. Normal compressibility, respiratory phasicity and response to augmentation. IMPRESSION: 1. Negative for left upper extremity DVT. 2. Positive for left cephalic vein superficial thrombosis/thrombophlebitis at the antecubital fossa. Electronically Signed   By: CHRISTELLA.  Shick M.D.   On: 03/27/2024 10:30   ECHOCARDIOGRAM COMPLETE Result Date: 03/25/2024    ECHOCARDIOGRAM REPORT   Patient Name:   Glenda Nguyen Date of Exam: 03/25/2024 Medical Rec #:  [id number]           Height:       67.0 in Accession #:    [id number]          Weight:       188.3 lb Date of Birth:  08/19/1934           BSA:          1.971 m Patient Age:    89 years            BP:           126/57 mmHg Patient Gender: F                   HR:           102 bpm. Exam Location:  Inpatient Procedure: 2D Echo, Cardiac Doppler and Color Doppler (Both Spectral and Color            Flow Doppler were utilized during procedure). Indications:    Atrial fibrillation  History:        Patient has no prior history of Echocardiogram examinations.                 Arrythmias:Atrial Fibrillation; Risk Factors:Hypertension.  Sonographer:    Philomena Daring Referring Phys: [email or web address] Makell Cyr IMPRESSIONS  1. Left ventricular ejection fraction, by estimation, is 65 to 70%. The left  ventricle has normal function. The left ventricle has no regional wall motion abnormalities. There is mild concentric left ventricular hypertrophy. Left ventricular diastolic parameters are indeterminate.  2. Right ventricular systolic function is normal. The right ventricular size is normal.  3. The mitral valve is normal in structure. No evidence of mitral valve regurgitation. No evidence of mitral stenosis.  4. The aortic valve was not well visualized. There is mild calcification of the aortic valve. Aortic valve regurgitation is mild. Aortic valve sclerosis is present, with no evidence of aortic valve stenosis.  5. The inferior vena cava is normal in size with greater than 50% respiratory variability, suggesting right atrial pressure of 3 mmHg. Comparison(s): No prior Echocardiogram. FINDINGS  Left Ventricle: Left ventricular ejection fraction, by estimation, is 65 to 70%. The left ventricle has normal function. The left ventricle has no regional wall motion abnormalities. The left ventricular internal cavity size was normal in size. There is  mild concentric left ventricular hypertrophy. Left ventricular diastolic parameters are indeterminate. Right Ventricle: The right ventricular size is normal. No increase in right ventricular wall thickness. Right ventricular systolic function is normal. Left Atrium: Left atrial size was normal in size. Right Atrium: Right atrial size  was normal in size. Pericardium: There is no evidence of pericardial effusion. Mitral Valve: The mitral valve is normal in structure. No evidence of mitral valve regurgitation. No evidence of mitral valve stenosis. Tricuspid Valve: The tricuspid valve is normal in structure. Tricuspid valve regurgitation is mild . No evidence of tricuspid stenosis. Aortic Valve: The aortic valve was not well visualized. There is mild calcification of the aortic valve. Aortic valve regurgitation is mild. Aortic regurgitation PHT measures 465 msec. Aortic valve  sclerosis is present, with no evidence of aortic valve stenosis. Pulmonic Valve: The pulmonic valve was normal in structure. Pulmonic valve regurgitation is mild. No evidence of pulmonic stenosis. Aorta: The aortic root and ascending aorta are structurally normal, with no evidence of dilitation. Venous: The inferior vena cava is normal in size with greater than 50% respiratory variability, suggesting right atrial pressure of 3 mmHg. IAS/Shunts: No atrial level shunt detected by color flow Doppler.  LEFT VENTRICLE PLAX 2D LVIDd:         3.90 cm   Diastology LVIDs:         2.60 cm   LV e' medial:    10.23 cm/s LV PW:         1.10 cm   LV E/e' medial:  9.5 LV IVS:        1.10 cm   LV e' lateral:   14.80 cm/s LVOT diam:     1.80 cm   LV E/e' lateral: 6.5 LV SV:         42 LV SV Index:   22 LVOT Area:     2.54 cm  RIGHT VENTRICLE             IVC RV Basal diam:  2.90 cm     IVC diam: 1.40 cm RV Mid diam:    2.20 cm RV S prime:     14.00 cm/s TAPSE (M-mode): 2.2 cm LEFT ATRIUM             Index        RIGHT ATRIUM           Index LA diam:        3.30 cm 1.67 cm/m   RA Area:     15.70 cm LA Vol (A2C):   46.8 ml 23.74 ml/m  RA Volume:   37.00 ml  18.77 ml/m LA Vol (A4C):   42.7 ml 21.66 ml/m LA Biplane Vol: 48.7 ml 24.71 ml/m  AORTIC VALVE LVOT Vmax:   129.33 cm/s LVOT Vmean:  83.367 cm/s LVOT VTI:    0.167 m AI PHT:      465 msec  AORTA Ao Root diam: 3.20 cm Ao Asc diam:  3.20 cm MITRAL VALVE               TRICUSPID VALVE MV Area (PHT): 4.67 cm    TR Peak grad:   30.9 mmHg MV Decel Time: 163 msec    TR Vmax:        278.00 cm/s MV E velocity: 96.90 cm/s                            SHUNTS                            Systemic VTI:  0.17 m  Systemic Diam: 1.80 cm Stanly Leavens MD Electronically signed by Stanly Leavens MD Signature Date/Time: 03/25/2024/12:13:57 PM    Final    DG Chest 1 View Result Date: 03/24/2024 CLINICAL DATA:  Weakness and fall. EXAM: CHEST  1 VIEW  COMPARISON:  None available. FINDINGS: The heart is mildly enlarged. No pulmonary vascular congestion. Mediastinal silhouette within normal limits. Lungs are clear. Deformity of the lateral RIGHT clavicle suspicious for fracture. IMPRESSION: 1. Mild cardiomegaly. 2. Deformity of the lateral RIGHT clavicle suspicious for fracture. Please correlate for focal tenderness. Electronically Signed   By: Aliene Lloyd M.D.   On: 03/24/2024 14:04   DG Knee Complete 4 Views Right Result Date: 03/24/2024 EXAM: 4 VIEW(S) XRAY OF THE KNEE 03/24/2024 11:07:00 AM COMPARISON: None available. CLINICAL HISTORY: fall FINDINGS: BONES AND JOINTS: No acute fracture. No malalignment. No significant joint effusion. Mild lateral compartment joint space narrowing. Marginal osteophytosis and subchondral sclerosis of the lateral compartment. Additional subtle marginal osteophytosis of the medial compartment. SOFT TISSUES: Mild diffuse soft tissue swelling. IMPRESSION: 1. No acute fracture or dislocation. 2. Mild diffuse soft tissue swelling. 3. Lateral compartment predominant osteoarthritis. Electronically signed by: Donnice Mania MD 03/24/2024 11:27 AM EST RP Workstation: [id number]   CT Cervical Spine Wo Contrast Result Date: 03/24/2024 EXAM: CT CERVICAL SPINE WITHOUT CONTRAST 03/24/2024 10:59:54 AM TECHNIQUE: CT of the cervical spine was performed without the administration of intravenous contrast. Multiplanar reformatted images are provided for review. Automated exposure control, iterative reconstruction, and/or weight based adjustment of the mA/kV was utilized to reduce the radiation dose to as low as reasonably achievable. COMPARISON: None available. CLINICAL HISTORY: Neck trauma (Age >= 65y) FINDINGS: BONES AND ALIGNMENT: Straightening of the normal cervical lordosis. Trace degenerative anterolisthesis of C2 on C3 and C3 on C4. Additional trace degenerative anterolisthesis of C7 on T1. No acute fracture or traumatic malalignment.  DEGENERATIVE CHANGES: Moderate disc space narrowing at C4-C5. Additional disc space narrowing at C5-C6 and C6-C7. Mild degenerative endplate osteophytes at multiple levels. There are disc osteophyte complexes at multiple levels without evidence of high grade osseous spinal canal stenosis. Facet arthrosis and uncovertebral hypertrophy at multiple levels. SOFT TISSUES: No prevertebral soft tissue swelling. LUNGS: Scarring and atelectasis in the right lung apex. IMPRESSION: 1. No evidence of acute traumatic injury. 2. Degenerative changes as above. Electronically signed by: Donnice Mania MD 03/24/2024 11:25 AM EST RP Workstation: [id number]   CT Head Wo Contrast Result Date: 03/24/2024 EXAM: CT HEAD WITHOUT CONTRAST 03/24/2024 10:59:54 AM TECHNIQUE: CT of the head was performed without the administration of intravenous contrast. Automated exposure control, iterative reconstruction, and/or weight based adjustment of the mA/kV was utilized to reduce the radiation dose to as low as reasonably achievable. COMPARISON: None available. CLINICAL HISTORY: Head trauma, minor (Age >= 65y). FINDINGS: BRAIN AND VENTRICLES: No acute hemorrhage. 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 Mild chronic microvascular ischemic change. No evidence of acute infarct. No hydrocephalus. No extra-axial collection. No mass effect or midline shift. ORBITS: Bilateral lens replacement noted. SINUSES: Trace right mastoid effusion. SOFT TISSUES AND SKULL: Right periorbital soft tissue swelling. No skull fracture. IMPRESSION: 1. No acute intracranial abnormality. 2. Right periorbital soft tissue swelling. 3. Focal encephalomalacia along the anterolateral left temporal lobe, suggestive of small remote infarct or sequelae of prior trauma. Electronically signed by: Donnice Mania MD 03/24/2024 11:18 AM EST RP Workstation: [id number]    Alm Schneider, DO  [facility]  If 7PM-7AM, please  contact night-coverage [email or web address] Password [facility] 04/08/2024, 7:53 AM   LOS: 5 days   "

## 2024-04-08 NOTE — Progress Notes (Signed)
 Witnessed patient fall from standing position straight back and hit her head on the floor.  Patient was standing at the sink as this nurse had assessed her ability to ambulate as she was a stand by assist.  Patient steady and standing at the sink, and the nurse was within arms reach of patient, suddenly fell straight back on the floor before this nurse was able to catch her.  As patient lying on the floor both eyes were fixed and staring up to the right.  She was unresponsive with a pulse and breathing for a few seconds before coming to and saying she did not feel anything and that she had no pain at the time.  A bump was noted on the back of the head.  This nurse and tech assisted patient to a standing position and patient able to ambulate a couple of steps back to bed.  Patient then said she felt like she passed away for a second.  Teley also called to report a spike in heart rate around the time before the fall of 140's.      04/08/24 2135  What Happened  Was fall witnessed? Yes  Who witnessed fall? Graeme Ferries, RN  Patients activity before fall bathroom-assisted  Point of contact head;buttocks  Was patient injured? Unsure  Provider Notification  Provider Name/Title Blondie, NP  Date Provider Notified 04/08/24  Time Provider Notified 2135  Method of Notification Page  Notification Reason Fall  Provider response In department  Date of Provider Response 04/08/24  Time of Provider Response 2137  Follow Up  Family notified No - patient refusal  Additional tests Yes-comment (CT, neuro assessment)  Simple treatment Ice  Progress note created (see row info) Yes  Adult Fall Risk Assessment  Risk Factor Category (scoring not indicated) Fall has occurred during this admission (document High fall risk)  Age 89  Fall History: Fall within 6 months prior to admission 0  Elimination; Bowel and/or Urine Incontinence 0  Elimination; Bowel and/or Urine Urgency/Frequency 0  Medications: includes  PCA/Opiates, Anti-convulsants, Anti-hypertensives, Diuretics, Hypnotics, Laxatives, Sedatives, and Psychotropics 3  Patient Care Equipment 1  Mobility-Assistance 2  Mobility-Gait 2  Mobility-Sensory Deficit 0  Altered awareness of immediate physical environment 0  Impulsiveness 0  Lack of understanding of one's physical/cognitive limitations 0  Total Score 9  Patient Fall Risk Level High fall risk  Adult Fall Risk Interventions  Required Bundle Interventions *See Row Information* High fall risk  Additional Interventions Use of appropriate toileting equipment (bedpan, BSC, etc.);Room near nurses station;PT/OT need assessed if change in mobility from baseline;Reorient/diversional activities with confused patients  Fall intervention(s) refused/Patient educated regarding refusal Bed alarm;Nonskid socks;Open door if unsupervised;Yellow bracelet;Supervision while toileting/edge of bed sitting  Screening for Fall Injury Risk (To be completed on HIGH fall risk patients) - Assessing Need for Floor Mats  Risk For Fall Injury- Criteria for Floor Mats 85 years or older;Bleeding risk-anticoagulation (not prophylaxis)  Will Implement Floor Mats Yes  Vitals  Temp (!) 97.3 F (36.3 C)  Temp Source Oral  BP (!) 158/87  MAP (mmHg) 106  BP Location Left Arm  BP Method Automatic  Patient Position (if appropriate) Lying  Pulse Rate 96  Pulse Rate Source Monitor  ECG Heart Rate (!) 104  Resp 19  Oxygen Therapy  SpO2 92 %  O2 Device Room Air  Pain Assessment  Pain Scale 0-10  Pain Score 4  Pain Type Acute pain  Pain Location Head  Pain Onset Other (Comment)  Pain Intervention(s) Medication (See eMAR)  PCA/Epidural/Spinal Assessment  Respiratory Pattern Regular  Neurological  Neuro (WDL) WDL  Level of Consciousness Alert  Orientation Level Oriented X4  Cognition Appropriate at baseline  Speech Clear  R Pupil Size (mm) 2  R Pupil Shape Round  R Pupil Reaction Brisk  L Pupil Size (mm) 2  L  Pupil Shape Round  L Pupil Reaction Brisk  Motor Function/Sensation Assessment Motor response;Sensation;Motor strength;Grip;Dorsiflexion;Plantar flexion  R Hand Grip Moderate  L Hand Grip Moderate  R Foot Dorsiflexion Moderate  L Foot Dorsiflexion Moderate  R Foot Plantar Flexion Moderate  L Foot Plantar Flexion Moderate  RUE Motor Response Purposeful movement  RUE Sensation Full sensation  RUE Motor Strength 5  LUE Motor Response Purposeful movement  LUE Sensation Full sensation  LUE Motor Strength 5  RLE Motor Response Purposeful movement  RLE Sensation Full sensation  RLE Motor Strength 5  LLE Motor Response Purposeful movement  LLE Sensation Full sensation  LLE Motor Strength 5  Neuro Symptoms None  Neuro Additional Assessments Glasgow Coma Scale  Glasgow Coma Scale  Eye Opening 4  Modified Verbal Response (INTUBATED) 5  Best Motor Response 6  Glasgow Coma Scale Score 15  Musculoskeletal  Musculoskeletal (WDL) WDL  Generalized Weakness Yes  Integumentary  Integumentary (WDL) X  Skin Integrity Ecchymosis  Ecchymosis Location Head  Ecchymosis Location Orientation Other (Comment) (posterior)

## 2024-04-08 NOTE — Discharge Summary (Incomplete)
 " Physician Discharge Summary   Patient: Glenda Nguyen MRN: [id number] DOB: 01/04/1935  Admit date:     04/03/2024  Discharge date: 04/09/2024  Discharge Physician: Alm Camari Quintanilla   PCP: Vick Lurie, FNP (Inactive)   Recommendations at discharge:   Please follow up with primary care provider within 1-2 weeks  Please repeat BMP and CBC in one week    Hospital Course: 89 year old female with a history of HFpEF, paroxysmal atrial fibrillation, hypertension, hyperlipidemia, prediabetes presenting with worsening lower extremity edema and coughing.  The patient was recently admitted to the hospital from 03/24/2024 to 03/31/2024 for hyponatremia, acute HFpEF, and new onset atrial fibrillation with RVR.  She was discharged home with torsemide  30 mg daily, Cardizem  CD 120 milligrams daily, and metoprolol  12.5 mg twice daily. The patient returns because of worsening lower extremity edema and cough.  She has had some difficulty ambulating secondary to pain associated with swelling of her legs and feet.  She denies any fevers, chills, chest pain, shortness breath, hemoptysis, nausea, vomiting, diarrhea, abdominal pain.  In the ED, she was afebrile and hemodynamically stable.  Initially she was tachycardic in the 120s.  She was started on IV furosemide . Unfortunately, the patient remained fluid overloaded, and her atrial fibrillation continued with rapid response.  She was placed on IV diltiazem  drip.  She was transition to the stepdown unit.  Her furosemide  IV doses were escalated.  Unfortunately, the patient's sodium decreased down to 122.  Nephrology was consulted.  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  The patient was given tolvaptan  for treatment of SIADH.  Her furosemide  was increased to 80 mg IV twice daily.  She became euvolemic.  Her sodium improved and remained stable.  She was transition to oral torsemide .  Assessment and Plan: Acute on chronic HFpEF -  04/03/2024 CTA chest--negative PE, trace bilateral pleural effusions, left greater than right, biapical scarring,RUL calcified granuloma - 03/25/2024 echo EF 65 to 70%, no WMA, normal RVF - Continue lasix  to 40 mg IV bid>>increase to 80 mg BID - 04/08/24 transition to torsemide  80 mg daily   Persistent atrial fibrillation with RVR - Continue apixaban  - 04/04/24--developed RVR>>started IV diltiazem  - 04/05/24--transition back to po diltiazem  - 03/25/24 Echo EF 65-70%, no WMA, normal RVF - 04/08/24--transition to Cardizem  CD 180 mg   Hyponatremia -04/05/24--Na down 127>>122>>131 (after tolvaptan ) - due to volume overload and SIAHD -required tolvaptan  last hospitalization -nephrology consult appreciated>>redose tovalptan 1/9 -check serum Osm--266 -urine Osm 581 -urine Na 69 - Na stable 130-131 last 24-48 hours   Influenza Bronchitis -1/9 personally reviewed CXR--increased interstitial markings -PCT 0.14 - started oseltamivir  1/8--finished 5 days   Elevated troponin - Secondary to demand ischemia - No chest pain presently - Personally reviewed EKG--atrial fibrillation with nonspecific T changes   Large right hilar lymph node measuring 14 mm -Possibly reactive, will need to be monitored - Outpatient surveillance and possible PET scan   Generalized weakness PT OT evaluation>>HHPT Fall precautions.   anion gap metabolic acidosis Serum bicarb 20, anion gap 17 on day of admission -improved   Hypokalemia - Repleted - Check magnesium  2.0        {Tip this will not be part of the note when signed Body mass index is 20.06 kg/m. , ,  (Optional):26781}   Consultants: renal Procedures performed: none  Disposition: Home Diet recommendation:  Cardiac diet DISCHARGE MEDICATION: Allergies as of 04/08/2024       Reactions   Penicillins Rash  Med Rec must be completed prior to using this SMARTLINK***       Follow-up Information     [facility].    Specialty: Home Health Services Contact information: [address] [place] [place] [address] [phone]                Discharge Exam: Fredricka Weights   04/06/24 0500 04/07/24 9378 04/08/24 0517  Weight: 62.5 kg 58.9 kg 58.1 kg   HEENT:  [place]/AT, No thrush, no icterus CV:  IRRR, no rub, no S3, no S4 Lung:  CTA, no wheeze, no rhonchi Abd:  soft/+BS, NT Ext:  No edema, no lymphangitis, no synovitis, no rash   Condition at discharge: stable  The results of significant diagnostics from this hospitalization (including imaging, microbiology, ancillary and laboratory) are listed below for reference.   Imaging Studies: DG CHEST PORT 1 VIEW Result Date: 04/05/2024 CLINICAL DATA:  Acute on chronic heart failure. Influenza bronchitis. EXAM: PORTABLE CHEST 1 VIEW COMPARISON:  04/03/2024 FINDINGS: Mildly decreased inspiration. Stable enlarged cardiac silhouette and mildly tortuous thoracic aorta. Mild chronic interstitial prominence. Minimal left lower lobe atelectasis. Diffuse osteopenia. IMPRESSION: 1. Minimal left lower lobe atelectasis. 2. Stable cardiomegaly and mild chronic interstitial disease. Electronically Signed   By: Elspeth Bathe M.D.   On: 04/05/2024 13:45   CT Angio Chest PE W and/or Wo Contrast Result Date: 04/03/2024 EXAM: CTA CHEST 04/03/2024 06:44:08 PM TECHNIQUE: CTA of the chest was performed without and with the administration of 75 mL of iohexol  (OMNIPAQUE ) 350 MG/ML injection. Multiplanar reformatted images are provided for review. MIP images are provided for review. Automated exposure control, iterative reconstruction, and/or weight based adjustment of the mA/kV was utilized to reduce the radiation dose to as low as reasonably achievable. COMPARISON: None available. CLINICAL HISTORY: Pulmonary embolism (PE) suspected, high prob. FINDINGS: PULMONARY ARTERIES: Pulmonary arteries are adequately opacified for evaluation. No acute pulmonary embolus. Main pulmonary artery is  normal in caliber. MEDIASTINUM: The heart and pericardium demonstrate no acute abnormality. There are atherosclerotic calcifications of the aorta. LYMPH NODES: Large right hilar lymph node measuring 14 mm. No mediastinal or axillary lymphadenopathy. LUNGS AND PLEURA: There is a trace of bilateral pleural effusions, left greater than right. No pneumothorax. There is atelectasis in the lingula. There is some scarring in both lung apices. There are calcified granulomas in the right upper lobe. The lungs are otherwise clear. UPPER ABDOMEN: Limited images of the upper abdomen are unremarkable. SOFT TISSUES AND BONES: No acute bone or soft tissue abnormality. IMPRESSION: 1. No pulmonary embolism. 2. Large right hilar lymph node measuring 14 mm. 3. Trace bilateral pleural effusions, left greater than right. Electronically signed by: Greig Pique MD 04/03/2024 07:28 PM EST RP Workstation: [id number]   DG Foot Complete Left Result Date: 04/03/2024 CLINICAL DATA:  Bruising and edema of left foot. EXAM: LEFT FOOT - COMPLETE 3+ VIEW COMPARISON:  None Available. FINDINGS: There is no evidence of acute fracture or dislocation. There is no evidence of arthropathy or other focal bone abnormality. Soft tissues are unremarkable. IMPRESSION: Negative. Electronically Signed   By: Marcey Moan M.D.   On: 04/03/2024 16:45   DG Chest 2 View Result Date: 04/03/2024 CLINICAL DATA:  Lower extremity swelling. EXAM: CHEST - 2 VIEW COMPARISON:  March 24, 2024 FINDINGS: The cardiac silhouette is mildly enlarged and unchanged in size. Mild, chronic appearing increased interstitial lung markings are seen without evidence of focal consolidation or pneumothorax. A small left pleural effusion  is suspected. A deformity of indeterminate age is seen along the distal right clavicle. Multilevel degenerative changes are present throughout the thoracic spine. IMPRESSION: 1. Chronic appearing increased interstitial lung markings without evidence of  acute or active cardiopulmonary disease. 2. Small left pleural effusion. 3. Deformity of indeterminate age along the distal right clavicle. Correlation with physical examination is recommended to determine the presence of point tenderness. Electronically Signed   By: Suzen Dials M.D.   On: 04/03/2024 13:11   US  Venous Img Upper Uni Left (DVT) Result Date: 03/27/2024 CLINICAL DATA:  Left upper extremity edema EXAM: LEFT UPPER EXTREMITY VENOUS DOPPLER ULTRASOUND TECHNIQUE: Gray-scale sonography with graded compression, as well as color Doppler and duplex ultrasound were performed to evaluate the upper extremity deep venous system from the level of the subclavian vein and including the jugular, axillary, basilic, radial, ulnar and upper cephalic vein. Spectral Doppler was utilized to evaluate flow at rest and with distal augmentation maneuvers. COMPARISON:  None Available. FINDINGS: Contralateral Subclavian Vein: Respiratory phasicity is normal and symmetric with the symptomatic side. No evidence of thrombus. Normal compressibility. Internal Jugular Vein: No evidence of thrombus. Normal compressibility, respiratory phasicity and response to augmentation. Subclavian Vein: No evidence of thrombus. Normal compressibility, respiratory phasicity and response to augmentation. Axillary Vein: No evidence of thrombus. Normal compressibility, respiratory phasicity and response to augmentation. Cephalic Vein: Left cephalic vein at the antecubital fossa demonstrates ill-defined hypoechoic intraluminal thrombus and is noncompressible compatible with superficial thrombosis/thrombophlebitis. Basilic Vein: No evidence of thrombus. Normal compressibility, respiratory phasicity and response to augmentation. Brachial Veins: No evidence of thrombus. Normal compressibility, respiratory phasicity and response to augmentation. Radial Veins: No evidence of thrombus. Normal compressibility, respiratory phasicity and response to  augmentation. Ulnar Veins: No evidence of thrombus. Normal compressibility, respiratory phasicity and response to augmentation. IMPRESSION: 1. Negative for left upper extremity DVT. 2. Positive for left cephalic vein superficial thrombosis/thrombophlebitis at the antecubital fossa. Electronically Signed   By: CHRISTELLA.  Shick M.D.   On: 03/27/2024 10:30   ECHOCARDIOGRAM COMPLETE Result Date: 03/25/2024    ECHOCARDIOGRAM REPORT   Patient Name:   NAYAB ATEN Date of Exam: 03/25/2024 Medical Rec #:  [id number]           Height:       67.0 in Accession #:    [id number]          Weight:       188.3 lb Date of Birth:  [birth year]           BSA:          1.971 m Patient Age:    89 years            BP:           126/57 mmHg Patient Gender: F                   HR:           102 bpm. Exam Location:  Inpatient Procedure: 2D Echo, Cardiac Doppler and Color Doppler (Both Spectral and Color            Flow Doppler were utilized during procedure). Indications:    Atrial fibrillation  History:        Patient has no prior history of Echocardiogram examinations.                 Arrythmias:Atrial Fibrillation; Risk Factors:Hypertension.  Sonographer:    Philomena Daring Referring Phys: [email or web address] Kielee Care IMPRESSIONS  1.  Left ventricular ejection fraction, by estimation, is 65 to 70%. The left ventricle has normal function. The left ventricle has no regional wall motion abnormalities. There is mild concentric left ventricular hypertrophy. Left ventricular diastolic parameters are indeterminate.  2. Right ventricular systolic function is normal. The right ventricular size is normal.  3. The mitral valve is normal in structure. No evidence of mitral valve regurgitation. No evidence of mitral stenosis.  4. The aortic valve was not well visualized. There is mild calcification of the aortic valve. Aortic valve regurgitation is mild. Aortic valve sclerosis is present, with no evidence of aortic valve stenosis.  5. The inferior vena cava is normal  in size with greater than 50% respiratory variability, suggesting right atrial pressure of 3 mmHg. Comparison(s): No prior Echocardiogram. FINDINGS  Left Ventricle: Left ventricular ejection fraction, by estimation, is 65 to 70%. The left ventricle has normal function. The left ventricle has no regional wall motion abnormalities. The left ventricular internal cavity size was normal in size. There is  mild concentric left ventricular hypertrophy. Left ventricular diastolic parameters are indeterminate. Right Ventricle: The right ventricular size is normal. No increase in right ventricular wall thickness. Right ventricular systolic function is normal. Left Atrium: Left atrial size was normal in size. Right Atrium: Right atrial size was normal in size. Pericardium: There is no evidence of pericardial effusion. Mitral Valve: The mitral valve is normal in structure. No evidence of mitral valve regurgitation. No evidence of mitral valve stenosis. Tricuspid Valve: The tricuspid valve is normal in structure. Tricuspid valve regurgitation is mild . No evidence of tricuspid stenosis. Aortic Valve: The aortic valve was not well visualized. There is mild calcification of the aortic valve. Aortic valve regurgitation is mild. Aortic regurgitation PHT measures 465 msec. Aortic valve sclerosis is present, with no evidence of aortic valve stenosis. Pulmonic Valve: The pulmonic valve was normal in structure. Pulmonic valve regurgitation is mild. No evidence of pulmonic stenosis. Aorta: The aortic root and ascending aorta are structurally normal, with no evidence of dilitation. Venous: The inferior vena cava is normal in size with greater than 50% respiratory variability, suggesting right atrial pressure of 3 mmHg. IAS/Shunts: No atrial level shunt detected by color flow Doppler.  LEFT VENTRICLE PLAX 2D LVIDd:         3.90 cm   Diastology LVIDs:         2.60 cm   LV e' medial:    10.23 cm/s LV PW:         1.10 cm   LV E/e' medial:   9.5 LV IVS:        1.10 cm   LV e' lateral:   14.80 cm/s LVOT diam:     1.80 cm   LV E/e' lateral: 6.5 LV SV:         42 LV SV Index:   22 LVOT Area:     2.54 cm  RIGHT VENTRICLE             IVC RV Basal diam:  2.90 cm     IVC diam: 1.40 cm RV Mid diam:    2.20 cm RV S prime:     14.00 cm/s TAPSE (M-mode): 2.2 cm LEFT ATRIUM             Index        RIGHT ATRIUM           Index LA diam:        3.30 cm 1.67 cm/m  RA Area:     15.70 cm LA Vol (A2C):   46.8 ml 23.74 ml/m  RA Volume:   37.00 ml  18.77 ml/m LA Vol (A4C):   42.7 ml 21.66 ml/m LA Biplane Vol: 48.7 ml 24.71 ml/m  AORTIC VALVE LVOT Vmax:   129.33 cm/s LVOT Vmean:  83.367 cm/s LVOT VTI:    0.167 m AI PHT:      465 msec  AORTA Ao Root diam: 3.20 cm Ao Asc diam:  3.20 cm MITRAL VALVE               TRICUSPID VALVE MV Area (PHT): 4.67 cm    TR Peak grad:   30.9 mmHg MV Decel Time: 163 msec    TR Vmax:        278.00 cm/s MV E velocity: 96.90 cm/s                            SHUNTS                            Systemic VTI:  0.17 m                            Systemic Diam: 1.80 cm Stanly Leavens MD Electronically signed by Stanly Leavens MD Signature Date/Time: 03/25/2024/12:13:57 PM    Final    DG Chest 1 View Result Date: 03/24/2024 CLINICAL DATA:  Weakness and fall. EXAM: CHEST  1 VIEW COMPARISON:  None available. FINDINGS: The heart is mildly enlarged. No pulmonary vascular congestion. Mediastinal silhouette within normal limits. Lungs are clear. Deformity of the lateral RIGHT clavicle suspicious for fracture. IMPRESSION: 1. Mild cardiomegaly. 2. Deformity of the lateral RIGHT clavicle suspicious for fracture. Please correlate for focal tenderness. Electronically Signed   By: Aliene Lloyd M.D.   On: 03/24/2024 14:04   DG Knee Complete 4 Views Right Result Date: 03/24/2024 EXAM: 4 VIEW(S) XRAY OF THE KNEE 03/24/2024 11:07:00 AM COMPARISON: None available. CLINICAL HISTORY: fall FINDINGS: BONES AND JOINTS: No acute fracture. No  malalignment. No significant joint effusion. Mild lateral compartment joint space narrowing. Marginal osteophytosis and subchondral sclerosis of the lateral compartment. Additional subtle marginal osteophytosis of the medial compartment. SOFT TISSUES: Mild diffuse soft tissue swelling. IMPRESSION: 1. No acute fracture or dislocation. 2. Mild diffuse soft tissue swelling. 3. Lateral compartment predominant osteoarthritis. Electronically signed by: Donnice Mania MD 03/24/2024 11:27 AM EST RP Workstation: [id number]   CT Cervical Spine Wo Contrast Result Date: 03/24/2024 EXAM: CT CERVICAL SPINE WITHOUT CONTRAST 03/24/2024 10:59:54 AM TECHNIQUE: CT of the cervical spine was performed without the administration of intravenous contrast. Multiplanar reformatted images are provided for review. Automated exposure control, iterative reconstruction, and/or weight based adjustment of the mA/kV was utilized to reduce the radiation dose to as low as reasonably achievable. COMPARISON: None available. CLINICAL HISTORY: Neck trauma (Age >= 65y) FINDINGS: BONES AND ALIGNMENT: Straightening of the normal cervical lordosis. Trace degenerative anterolisthesis of C2 on C3 and C3 on C4. Additional trace degenerative anterolisthesis of C7 on T1. No acute fracture or traumatic malalignment. DEGENERATIVE CHANGES: Moderate disc space narrowing at C4-C5. Additional disc space narrowing at C5-C6 and C6-C7. Mild degenerative endplate osteophytes at multiple levels. There are disc osteophyte complexes at multiple levels without evidence of high grade osseous spinal canal stenosis. Facet arthrosis and uncovertebral hypertrophy at multiple levels. SOFT TISSUES: No prevertebral  soft tissue swelling. LUNGS: Scarring and atelectasis in the right lung apex. IMPRESSION: 1. No evidence of acute traumatic injury. 2. Degenerative changes as above. Electronically signed by: Donnice Mania MD 03/24/2024 11:25 AM EST RP Workstation: [id number]   CT Head Wo  Contrast Result Date: 03/24/2024 EXAM: CT HEAD WITHOUT CONTRAST 03/24/2024 10:59:54 AM TECHNIQUE: CT of the head was performed without the administration of intravenous contrast. Automated exposure control, iterative reconstruction, and/or weight based adjustment of the mA/kV was utilized to reduce the radiation dose to as low as reasonably achievable. COMPARISON: None available. CLINICAL HISTORY: Head trauma, minor (Age >= 65y). FINDINGS: BRAIN AND VENTRICLES: No acute hemorrhage. 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 Mild chronic microvascular ischemic change. No evidence of acute infarct. No hydrocephalus. No extra-axial collection. No mass effect or midline shift. ORBITS: Bilateral lens replacement noted. SINUSES: Trace right mastoid effusion. SOFT TISSUES AND SKULL: Right periorbital soft tissue swelling. No skull fracture. IMPRESSION: 1. No acute intracranial abnormality. 2. Right periorbital soft tissue swelling. 3. Focal encephalomalacia along the anterolateral left temporal lobe, suggestive of small remote infarct or sequelae of prior trauma. Electronically signed by: Donnice Mania MD 03/24/2024 11:18 AM EST RP Workstation: [id number]    Microbiology: Results for orders placed or performed during the hospital encounter of 04/03/24  Expectorated Sputum Assessment w Gram Stain, Rflx to Resp Cult     Status: None   Collection Time: 04/04/24  4:30 PM   Specimen: Sputum  Result Value Ref Range Status   Specimen Description SPUTUM  Final   Special Requests Normal  Final   Sputum evaluation   Final    THIS SPECIMEN IS ACCEPTABLE FOR SPUTUM CULTURE Performed at [facility], [address] [place], [place] [address]    Report Status 04/04/2024 FINAL  Final  Culture, Respiratory w Gram Stain     Status: None   Collection Time: 04/04/24  4:30 PM   Specimen: SPU  Result Value Ref Range Status   Specimen  Description   Final    SPUTUM Performed at [facility], [address], [place], [place] [address]    Special Requests   Final    Normal Reflexed from [id number] Performed at [facility], [address] [place], [place] [address]    Gram Stain   Final    ABUNDANT SQUAMOUS EPITHELIAL CELLS PRESENT NO WBC SEEN FEW GRAM POSITIVE RODS RARE YEAST RARE GRAM POSITIVE COCCI RARE GRAM NEGATIVE RODS    Culture   Final    FEW Normal respiratory flora-no Staph aureus or Pseudomonas seen Performed at [facility], [address] [place], [place] [address]    Report Status 04/08/2024 FINAL  Final  Resp panel by RT-PCR (RSV, Flu A&B, Covid) Nasal Mucosa     Status: Abnormal   Collection Time: 04/04/24  6:34 PM   Specimen: Nasal Mucosa; Nasal Swab  Result Value Ref Range Status   SARS Coronavirus 2 by RT PCR NEGATIVE NEGATIVE Final    Comment: (NOTE) SARS-CoV-2 target nucleic acids are NOT DETECTED.  The SARS-CoV-2 RNA is generally detectable in upper respiratory specimens during the acute phase of infection. The lowest concentration of SARS-CoV-2 viral copies this assay can detect is 138 copies/mL. A negative result does not preclude SARS-Cov-2 infection and should not be used as the sole basis for treatment or other patient management decisions. A negative result may occur with  improper specimen collection/handling, submission of specimen  other than nasopharyngeal swab, presence of viral mutation(s) within the areas targeted by this assay, and inadequate number of viral copies(<138 copies/mL). A negative result must be combined with clinical observations, patient history, and epidemiological information. The expected result is Negative.  Fact Sheet for Patients:  bloggercourse.com  Fact Sheet for Healthcare Providers:  seriousbroker.it  This test is no t yet approved or cleared by the [place]  FDA and  has been authorized  for detection and/or diagnosis of SARS-CoV-2 by FDA under an Emergency Use Authorization (EUA). This EUA will remain  in effect (meaning this test can be used) for the duration of the COVID-19 declaration under Section 564(b)(1) of the Act, 21 U.S.C.section 360bbb-3(b)(1), unless the authorization is terminated  or revoked sooner.       Influenza A by PCR POSITIVE (A) NEGATIVE Final   Influenza B by PCR NEGATIVE NEGATIVE Final    Comment: (NOTE) The Xpert Xpress SARS-CoV-2/FLU/RSV plus assay is intended as an aid in the diagnosis of influenza from Nasopharyngeal swab specimens and should not be used as a sole basis for treatment. Nasal washings and aspirates are unacceptable for Xpert Xpress SARS-CoV-2/FLU/RSV testing.  Fact Sheet for Patients: bloggercourse.com  Fact Sheet for Healthcare Providers: seriousbroker.it  This test is not yet approved or cleared by the [place]  FDA and has been authorized for detection and/or diagnosis of SARS-CoV-2 by FDA under an Emergency Use Authorization (EUA). This EUA will remain in effect (meaning this test can be used) for the duration of the COVID-19 declaration under Section 564(b)(1) of the Act, 21 U.S.C. section 360bbb-3(b)(1), unless the authorization is terminated or revoked.     Resp Syncytial Virus by PCR NEGATIVE NEGATIVE Final    Comment: (NOTE) Fact Sheet for Patients: bloggercourse.com  Fact Sheet for Healthcare Providers: seriousbroker.it  This test is not yet approved or cleared by the [place]  FDA and has been authorized for detection and/or diagnosis of SARS-CoV-2 by FDA under an Emergency Use Authorization (EUA). This EUA will remain in effect (meaning this test can be used) for the duration of the COVID-19 declaration under Section 564(b)(1) of the Act, 21 U.S.C. section 360bbb-3(b)(1), unless the  authorization is terminated or revoked.  Performed at [facility], [address] [place], [place] [address]   Respiratory (~20 pathogens) panel by PCR     Status: Abnormal   Collection Time: 04/04/24  6:34 PM   Specimen: Nasopharyngeal Swab; Respiratory  Result Value Ref Range Status   Adenovirus NOT DETECTED NOT DETECTED Final   Coronavirus 229E NOT DETECTED NOT DETECTED Final    Comment: (NOTE) The Coronavirus on the Respiratory Panel, DOES NOT test for the novel  Coronavirus (2019 nCoV)    Coronavirus HKU1 NOT DETECTED NOT DETECTED Final   Coronavirus NL63 NOT DETECTED NOT DETECTED Final   Coronavirus OC43 NOT DETECTED NOT DETECTED Final   Metapneumovirus NOT DETECTED NOT DETECTED Final   Rhinovirus / Enterovirus NOT DETECTED NOT DETECTED Final   Influenza A H3 DETECTED (A) NOT DETECTED Final   Influenza B NOT DETECTED NOT DETECTED Final   Parainfluenza Virus 1 NOT DETECTED NOT DETECTED Final   Parainfluenza Virus 2 NOT DETECTED NOT DETECTED Final   Parainfluenza Virus 3 NOT DETECTED NOT DETECTED Final   Parainfluenza Virus 4 NOT DETECTED NOT DETECTED Final   Respiratory Syncytial Virus NOT DETECTED NOT DETECTED Final   Bordetella pertussis NOT DETECTED NOT DETECTED Final   Bordetella Parapertussis NOT DETECTED NOT DETECTED Final   Chlamydophila pneumoniae NOT  DETECTED NOT DETECTED Final   Mycoplasma pneumoniae NOT DETECTED NOT DETECTED Final    Comment: Performed at [facility], [address] [place], [place] [address]  MRSA Next Gen by PCR, Nasal     Status: None   Collection Time: 04/04/24  6:34 PM   Specimen: Nasal Mucosa; Nasal Swab  Result Value Ref Range Status   MRSA by PCR Next Gen NOT DETECTED NOT DETECTED Final    Comment: (NOTE) The GeneXpert MRSA Assay (FDA approved for NASAL specimens only), is one component of a comprehensive MRSA colonization surveillance program. It is not intended to diagnose MRSA infection nor to guide or monitor treatment for  MRSA infections. Test performance is not FDA approved in patients less than 8 years old. Performed at [facility], [address] [place], [place] [address]     Labs: CBC: Recent Labs  Lab 04/03/24 1232 04/04/24 0409 04/06/24 0542 04/07/24 0103  WBC 5.8 5.1 3.8* 4.2  NEUTROABS 4.9  --   --   --   HGB 10.0* 9.7* 10.4* 11.1*  HCT 29.2* 27.8* 30.3* 33.3*  MCV 90.1 88.3 89.6 90.2  PLT 368 362 359 375   Basic Metabolic Panel: Recent Labs  Lab 04/04/24 0409 04/05/24 0429 04/05/24 0905 04/06/24 0542 04/06/24 1502 04/06/24 1516 04/07/24 0103 04/07/24 0945 04/07/24 1624 04/08/24 0413  NA 127* 119*   < > 123* 129* 129* 132* 131* 130* 131*  K 3.2* 3.3*   < > 4.7 4.3  --   --  3.9 3.9 4.0  CL 86* 80*   < > 87* 89*  --   --  90* 90* 91*  CO2 33* 31   < > 22 25  --   --  33* 26 28  GLUCOSE 95 116*   < > 81 97  --   --  137* 102* 90  BUN 16 15   < > 18 18  --   --  14 19 16   CREATININE 0.75 0.79   < > 0.72 0.85  --   --  0.85 0.89 0.87  CALCIUM 8.1* 8.4*   < > 8.6* 8.9  --   --  9.1 8.9 9.1  MG 2.0 2.1  --  2.0  --   --   --   --   --  2.0  PHOS 3.9  --   --   --   --   --   --   --   --   --    < > = values in this interval not displayed.   Liver Function Tests: Recent Labs  Lab 04/06/24 0818  AST 31  ALT 23  ALKPHOS 86  BILITOT 0.2  PROT 6.9  ALBUMIN 3.5   CBG: No results for input(s): GLUCAP in the last 168 hours.  Discharge time spent: greater than 30 minutes.  Signed: Alm Schneider, MD [facility] 04/08/2024 "

## 2024-04-08 NOTE — Progress Notes (Addendum)
"  ° °      Overnight   NAME: Glenda Nguyen MRN: [id number] DOB : 09/11/1934    Date of Service   04/08/2024   HPI/Events of Note    Notified by RN for witnessed fall.  Brief history 89 year old female history of HFpEF, paroxysmal A-fib, hypertension, hyperlipidemia, prediabetes.  Admitted through ER for worsening lower extremity edema and coughing.  RN explains the following: RN states patient fell from a standing position straight back and hit her head on the floor. At that time, she had been standing at the sink with nurse assistance and she is noted as a standby assist Patient was within arms reach of the nurse, at which time she suddenly fell back before the RN was able to catch her. She apparently had a moment of loss of consciousness by her (the Pt) description. She immediately awoke with no pain to the RN at that time. She does have a noted bump on the back of her head.  RN staff assisted her to a standing position and she was able to ambulate a couple steps to the bed. Telemetry notified RN that there was potentially a spike in heart rate of 140 at the same time.  Bedside visit Patient is awake and oriented x 3, in no obvious or stated distress. No deformities, contusions, abrasions, punctures, bruising, tears, lacerations. There is swelling as noted by RN to the back of her head.  She has movement of extremities x 4 equal. No change in pupils from baseline. She is on Eliquis .   Interventions/ Plan   CT head STAT Fall precautions Bed alarm Fall protocol for facility and floor(follow-up neuro checks etc.)       Update 2328 hrs. Patient remains awake and oriented x 3 Movement of extremities as above.  CT in part as below ----------------------------------------------------------------------------- SOFT TISSUES AND SKULL: Soft tissue swelling with moderate hematoma overlying the right parietal vertex (image 67). No skull fracture.    IMPRESSION: 1. Soft tissue swelling with moderate hematoma overlying the right parietal vertex. 2. No acute intracranial abnormality.   Electronically signed by: Pinkie Pebbles MD MD 04/08/2024 10:22 PM EST RP Workstation: [id number]  Plan Continue full protocol post fall. Fall precautions Continue all attending prior orders.  Patient continues to have no obvious or stated distress.    Update 0118 Hrs  Called Rapid Response for second occurrence of Syncope - this event did not result in any fall. Patient was attempting to use bedside commode and was assisted back to bed without incidence . Patient describes her head spinning  that did not result in unconsciousness.  Likely Syncope or Vagal response Labs now CBC BMP Mag Gentle bolus Rehydration (reduced rate)    Remain in bed for now   Awake Oriented x 4 and Conversational; in no obvious or stated distress at this time.Glenda Nguyen BSN MSNA MSN ACNPC-AG Acute Care Nurse Practitioner [facility] [place]  "

## 2024-04-09 DIAGNOSIS — I4891 Unspecified atrial fibrillation: Secondary | ICD-10-CM | POA: Diagnosis not present

## 2024-04-09 DIAGNOSIS — J111 Influenza due to unidentified influenza virus with other respiratory manifestations: Secondary | ICD-10-CM | POA: Diagnosis not present

## 2024-04-09 DIAGNOSIS — E871 Hypo-osmolality and hyponatremia: Secondary | ICD-10-CM | POA: Diagnosis not present

## 2024-04-09 DIAGNOSIS — I5033 Acute on chronic diastolic (congestive) heart failure: Secondary | ICD-10-CM | POA: Diagnosis not present

## 2024-04-09 LAB — BASIC METABOLIC PANEL WITH GFR
Anion gap: 16 — ABNORMAL HIGH (ref 5–15)
BUN: 23 mg/dL (ref 8–23)
CO2: 25 mmol/L (ref 22–32)
Calcium: 9.4 mg/dL (ref 8.9–10.3)
Chloride: 88 mmol/L — ABNORMAL LOW (ref 98–111)
Creatinine, Ser: 1.18 mg/dL — ABNORMAL HIGH (ref 0.44–1.00)
GFR, Estimated: 44 mL/min — ABNORMAL LOW
Glucose, Bld: 125 mg/dL — ABNORMAL HIGH (ref 70–99)
Potassium: 3.8 mmol/L (ref 3.5–5.1)
Sodium: 129 mmol/L — ABNORMAL LOW (ref 135–145)

## 2024-04-09 LAB — MAGNESIUM: Magnesium: 2 mg/dL (ref 1.7–2.4)

## 2024-04-09 LAB — CBC
HCT: 35.9 % — ABNORMAL LOW (ref 36.0–46.0)
Hemoglobin: 12.3 g/dL (ref 12.0–15.0)
MCH: 30.3 pg (ref 26.0–34.0)
MCHC: 34.3 g/dL (ref 30.0–36.0)
MCV: 88.4 fL (ref 80.0–100.0)
Platelets: 449 K/uL — ABNORMAL HIGH (ref 150–400)
RBC: 4.06 MIL/uL (ref 3.87–5.11)
RDW: 14.8 % (ref 11.5–15.5)
WBC: 11.2 K/uL — ABNORMAL HIGH (ref 4.0–10.5)
nRBC: 0 % (ref 0.0–0.2)

## 2024-04-09 LAB — GLUCOSE, CAPILLARY: Glucose-Capillary: 117 mg/dL — ABNORMAL HIGH (ref 70–99)

## 2024-04-09 MED ORDER — IPRATROPIUM-ALBUTEROL 0.5-2.5 (3) MG/3ML IN SOLN
3.0000 mL | Freq: Two times a day (BID) | RESPIRATORY_TRACT | Status: DC
  Administered 2024-04-09 – 2024-04-10 (×2): 3 mL via RESPIRATORY_TRACT
  Filled 2024-04-09 (×2): qty 3

## 2024-04-09 MED ORDER — IPRATROPIUM-ALBUTEROL 0.5-2.5 (3) MG/3ML IN SOLN: 3.0000 mL | Freq: Four times a day (QID) | RESPIRATORY_TRACT | Status: DC | PRN

## 2024-04-09 MED ORDER — SODIUM CHLORIDE 0.9 % IV BOLUS
500.0000 mL | Freq: Once | INTRAVENOUS | Status: AC
  Administered 2024-04-09: 500 mL via INTRAVENOUS

## 2024-04-09 NOTE — TOC Progression Note (Signed)
 Transition of Care [facility]) - Progression Note    Patient Details  Name: Glenda Nguyen MRN: [id number] Date of Birth: 06/05/1934  [facility] CM/SW Contact  Mcarthur Saddie Kim, [place] Phone Number: 04/09/2024, 11:00 AM  Clinical Narrative:  PT now recommending SNF. LCSW discussed placement process with pt and family, including insurance authorization and Medicare.gov ratings. Requests [place] or [place] placement. Will initiate bed search and SNF auth.          Barriers to Discharge: Continued Medical Work up               Expected Discharge Plan and Services                                   HH Arranged: OT, PT [facility] Agency: [facility] Date [facility] Agency Contacted: 04/04/24 Time HH Agency Contacted: 1247 Representative spoke with at [facility] Agency: [place]   Social Drivers of Health (SDOH) Interventions SDOH Screenings   Food Insecurity: No Food Insecurity (04/03/2024)  Housing: Unknown (04/04/2024)  Transportation Needs: No Transportation Needs (04/03/2024)  Utilities: Not At Risk (04/03/2024)  Social Connections: Unknown (04/03/2024)  Tobacco Use: Low Risk (04/03/2024)    Readmission Risk Interventions    03/29/2024   11:22 AM  Readmission Risk Prevention Plan  Post Dischage Appt Complete  Medication Screening Complete  Transportation Screening Complete

## 2024-04-09 NOTE — NC FL2 (Signed)
 " [place]  MEDICAID FL2 LEVEL OF CARE FORM     IDENTIFICATION  Patient Name: Glenda Nguyen Birthdate: 1934-06-10 Sex: female Admission Date (Current Location): 04/03/2024  [facility] [place] Number:  [employer] and Address:  [facility],  [address] [place] [address]      Provider Number: [id number]  Attending Physician Name and Address:  Evonnie Lenis, MD  Relative Name and Phone Number:       Current Level of Care: Hospital Recommended Level of Care: Skilled Nursing Facility Prior Approval Number:    Date Approved/Denied:   PASRR Number: [id number]  Discharge Plan: SNF    Current Diagnoses: Patient Active Problem List   Diagnosis Date Noted   Bronchitis with influenza 04/05/2024   Hypokalemia 04/04/2024   Acute on chronic diastolic (congestive) heart failure (HCC) 04/03/2024   Generalized weakness 03/31/2024   Acute heart failure with preserved ejection fraction (HFpEF) (HCC) 03/27/2024   Hyponatremia 03/24/2024   Essential hypertension 03/24/2024   Atrial fibrillation with RVR (HCC) 03/24/2024   Allergic contact dermatitis 09/09/2019   Hypercholesterolemia 08/15/2019    Orientation RESPIRATION BLADDER Height & Weight     Self, Time, Situation, Place  O2 (2L) Continent Weight: 128 lb 15.5 oz (58.5 kg) Height:  5' 7 (170.2 cm)  BEHAVIORAL SYMPTOMS/MOOD NEUROLOGICAL BOWEL NUTRITION STATUS      Continent Diet (See d/c summary)  AMBULATORY STATUS COMMUNICATION OF NEEDS Skin   Extensive Assist Verbally Skin abrasions, Bruising, Other (Comment) (Redness to bilateral buttocks)                       Personal Care Assistance Level of Assistance  Bathing, Feeding, Dressing Bathing Assistance: Maximum assistance Feeding assistance: Limited assistance Dressing Assistance: Maximum assistance     Functional Limitations Info  Sight, Hearing, Speech Sight Info: Impaired Hearing Info: Impaired Speech Info: Adequate     SPECIAL CARE FACTORS FREQUENCY  PT (By licensed PT)     PT Frequency: 5x weekly              Contractures      Additional Factors Info  Code Status, Allergies Code Status Info: Full Allergies Info: Penicillins           Current Medications (04/09/2024):  This is the current hospital active medication list Current Facility-Administered Medications  Medication Dose Route Frequency Provider Last Rate Last Admin   acetaminophen  (TYLENOL ) tablet 500 mg  500 mg Oral Q6H PRN Tat, David, MD   500 mg at 04/08/24 2226   benzonatate  (TESSALON ) capsule 100 mg  100 mg Oral BID PRN Evonnie Lenis, MD   100 mg at 04/08/24 0857   Chlorhexidine  Gluconate Cloth 2 % PADS 6 each  6 each Topical Daily Tat, David, MD   6 each at 04/09/24 0855   diltiazem  (CARDIZEM  CD) 24 hr capsule 180 mg  180 mg Oral Daily Tat, David, MD   180 mg at 04/09/24 0854   guaiFENesin -dextromethorphan  (ROBITUSSIN DM) 100-10 MG/5ML syrup 5 mL  5 mL Oral Q4H PRN Tat, Lenis, MD   5 mL at 04/08/24 0858   ipratropium-albuterol  (DUONEB) 0.5-2.5 (3) MG/3ML nebulizer solution 3 mL  3 mL Nebulization TID Evonnie Lenis, MD   3 mL at 04/09/24 0704   melatonin tablet 6 mg  6 mg Oral QHS PRN Evonnie Lenis, MD   6 mg at 04/05/24 2112   Oral care mouth rinse  15 mL Mouth Rinse PRN Tat, Lenis, MD  oseltamivir  (TAMIFLU ) capsule 30 mg  30 mg Oral BID Tat, David, MD   30 mg at 04/09/24 0854   polyethylene glycol (MIRALAX  / GLYCOLAX ) packet 17 g  17 g Oral Daily PRN Tat, Alm, MD       prochlorperazine  (COMPAZINE ) injection 5 mg  5 mg Intravenous Q6H PRN Evonnie Alm, MD         Discharge Medications: Please see discharge summary for a list of discharge medications.  Relevant Imaging Results:  Relevant Lab Results:   Additional Information SSN: [id number]  Mcarthur Saddie Kim, LCSW     "

## 2024-04-09 NOTE — Progress Notes (Addendum)
 Assisted patient to [facility], patient slumped back and became unresponsive.  Rapid Response called.  Manfred, MD and Blondie, NP responded to the room.  Placed patient back in bed, VS obtained and patient now alert/oriented x 4

## 2024-04-09 NOTE — Evaluation (Signed)
 Physical Therapy Evaluation Patient Details Name: KENDYLL HUETTNER MRN: [id number] DOB: 09/26/1934 Today's Date: 04/09/2024  History of Present Illness  MALYA CIRILLO is a 89 y.o. female with medical history significant for hypertension, hyperlipidemia, recently admitted on 03/24/2024 and discharged home on 03/31/2024 with new diagnoses of atrial fibrillation and hyponatremia, who presents to the ER with complaints of legs swelling and >2 weeks cough.  Denies any recent injury.  Has had difficulty ambulating due to the pain associated with the swelling of her feet and legs.     In the ER, tachycardic and tachypneic.  Chest x-ray was nonacute.  proBNP 6228.  CTA was negative for pulmonary embolism however showed a large right hilar lymph node measuring 14 mm.  Trace bilateral pleural effusions, left greater than right.  Volume overload on exam.       Has a dorsal left foot scar that has been present for months and is non tender without significant erythema, doubt cellulitis.  The patient received IV Lasix  in the ER.  Due to concerns for left dorsal foot cellulitis the patient also received IV clindamycin  by EDP.     Admitted by [facility], hospitalist service, for further management of acute on chronic HFpEF and questionable left foot cellulitis.   Clinical Impression  Patient demonstrates slow labored movement for sitting up at bedside, limited for functional activity mostly due to having near syncopal episdoe when standing and unable to ambulate away from bed side due to fall risk. Patient put back to bed after therapy - MD notified. Patient will benefit from continued skilled physical therapy in hospital and recommended venue below to increase strength, balance, endurance for safe ADLs and gait.          If plan is discharge home, recommend the following: A lot of help with bathing/dressing/bathroom;A lot of help with walking and/or transfers;Assistance with cooking/housework;Assist for  transportation;Help with stairs or ramp for entrance   Can travel by private vehicle   Yes    Equipment Recommendations Rolling walker (2 wheels)  Recommendations for Other Services       Functional Status Assessment Patient has had a recent decline in their functional status and demonstrates the ability to make significant improvements in function in a reasonable and predictable amount of time.     Precautions / Restrictions Precautions Precautions: Fall Recall of Precautions/Restrictions: Intact Restrictions Weight Bearing Restrictions Per Provider Order: No      Mobility  Bed Mobility Overal bed mobility: Needs Assistance Bed Mobility: Supine to Sit, Sit to Supine     Supine to sit: Contact guard, Min assist Sit to supine: Contact guard assist, Min assist   General bed mobility comments: increased time, labored movement    Transfers Overall transfer level: Needs assistance Equipment used: Rolling walker (2 wheels) Transfers: Sit to/from Stand, Bed to chair/wheelchair/BSC Sit to Stand: Min assist           General transfer comment: unsteady labored movement    Ambulation/Gait Ambulation/Gait assistance: Mod assist Gait Distance (Feet): 2 Feet Assistive device: Rolling walker (2 wheels) Gait Pattern/deviations: Decreased step length - left, Decreased stance time - right, Decreased stride length Gait velocity: Dec     General Gait Details: limited to a couple of side steps due to poor standing balance, near syncope  Stairs            Wheelchair Mobility     Tilt Bed    Modified Rankin (Stroke Patients Only)  Balance Overall balance assessment: Needs assistance Sitting-balance support: Feet supported, No upper extremity supported Sitting balance-Leahy Scale: Good Sitting balance - Comments: seated at EOB   Standing balance support: No upper extremity supported Standing balance-Leahy Scale: Poor Standing balance comment: fair/poor  using RW                             Pertinent Vitals/Pain Pain Assessment Pain Assessment: No/denies pain    Home Living Family/patient expects to be discharged to:: Private residence Living Arrangements: Children;Other relatives Available Help at Discharge: Family;Available 24 hours/day;Friend(s) Type of Home: House Home Access: Stairs to enter Entrance Stairs-Rails: Right;Left;Can reach both Entrance Stairs-Number of Steps: 3   Home Layout: One level Home Equipment: Other (comment)      Prior Function Prior Level of Function : Independent/Modified Independent;Driving;History of Falls (last six months)             Mobility Comments: information from recent admission, Community ambulator without AD; drives; reported using walking stick as needed to help reach things and boost from a low surfaces such as toilet reports no falls or instances since being discharged last ADLs Comments: Independent.     Extremity/Trunk Assessment   Upper Extremity Assessment Upper Extremity Assessment: Generalized weakness    Lower Extremity Assessment Lower Extremity Assessment: Generalized weakness    Cervical / Trunk Assessment Cervical / Trunk Assessment: Kyphotic  Communication   Communication Communication: No apparent difficulties    Cognition Arousal: Alert Behavior During Therapy: WFL for tasks assessed/performed                             Following commands: Intact       Cueing Cueing Techniques: Verbal cues, Tactile cues, Visual cues     General Comments      Exercises     Assessment/Plan    PT Assessment Patient needs continued PT services  PT Problem List Decreased strength;Decreased range of motion;Decreased activity tolerance;Decreased balance;Decreased mobility;Decreased knowledge of use of DME       PT Treatment Interventions Balance training;DME instruction;Gait training;Stair training;Functional mobility  training;Therapeutic activities;Therapeutic exercise;Patient/family education    PT Goals (Current goals can be found in the Care Plan section)  Acute Rehab PT Goals Patient Stated Goal: return home PT Goal Formulation: With patient Time For Goal Achievement: 04/23/24 Potential to Achieve Goals: Good    Frequency Min 3X/week     Co-evaluation               AM-PAC PT 6 Clicks Mobility  Outcome Measure Help needed turning from your back to your side while in a flat bed without using bedrails?: A Little Help needed moving from lying on your back to sitting on the side of a flat bed without using bedrails?: A Little Help needed moving to and from a bed to a chair (including a wheelchair)?: A Little Help needed standing up from a chair using your arms (e.g., wheelchair or bedside chair)?: A Little Help needed to walk in hospital room?: A Lot Help needed climbing 3-5 steps with a railing? : A Lot 6 Click Score: 16    End of Session   Activity Tolerance: Patient tolerated treatment well;Patient limited by fatigue Patient left: in bed;with call bell/phone within reach Nurse Communication: Mobility status PT Visit Diagnosis: Unsteadiness on feet (R26.81);Other abnormalities of gait and mobility (R26.89);History of falling (Z91.81);Muscle weakness (generalized) (M62.81)  Time: 1020-1045 PT Time Calculation (min) (ACUTE ONLY): 25 min   Charges:   PT Evaluation $PT Eval Moderate Complexity: 1 Mod PT Treatments $Therapeutic Activity: 23-37 mins PT General Charges $$ ACUTE PT VISIT: 1 Visit         4:20 PM, 04/09/2024 Lynwood Music, MPT Physical Therapist with [facility] [phone] office [phone] mobile phone

## 2024-04-09 NOTE — Progress Notes (Signed)
 [facility] NEPHROLOGY PROGRESS NOTE  Assessment/ Plan: Pt is a 89 y.o. yo female with past medical history significant for hypertension, dyslipidemia, prediabetes, A-fib, CHF with preserved EF was recently hospitalized from 12/27 - 03/31/2024 for hyponatremia, CHF and A-fib with RVR presents now with worsening lower extremity edema and coughing. Our team saw the patient for hyponatremia which was treated with diuretics and a dose of tolvaptan  with improvement of sodium to 130.  Now we are reconsulted for worsening hyponatremia.  # Acute on chronic hyponatremia: Longstanding issue and historical problem.  Does have chronic lower extremity edema.  Hard to tell if this is lymphedema.  Does probably have some predisposition for volume overload related to heart failure with preserved ejection fraction.  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  Has responded to tolvaptan  in the past and got earlier this hospitalization.  Likely worse at this time related to viral illness - She was recently switched to Torsemide  - pause for now as concern for depletion    - no tolvaptan  today  - Likely would benefit from outpatient nephrology follow-up  # Hypokalemia: improved   # Acute on chronic CHF with preserved EF: diuretics as above  # Influenza/acute bronchitis: Supportive treatment per primary team.  # HTN/volume: BP acceptable. Volume as above  Disposition - continue inpatient monitoring     Subjective: per charting, had an episode overnight where she slumped back and became unresponsive.  Rapid response was called.  She fell overnight.  She hit her head.  Was given a 500 mL bolus of NS.  She hasn't gotten torsemide  yet this AM.    Review of systems:  Denies shortness of breath or chest pain  Denies n/v but has had diarrhea; loose stools    Objective Vital signs in last 24 hours: Vitals:   04/09/24 0512 04/09/24 0618 04/09/24 0704 04/09/24 0926  BP: 113/62 124/68   119/76  Pulse: 93 (!) 105  96  Resp: 19 18  20   Temp: 98.2 F (36.8 C)   98.6 F (37 C)  TempSrc: Oral   Oral  SpO2: 96% 98% 100% 97%  Weight:      Height:       Weight change: 0.4 kg  Intake/Output Summary (Last 24 hours) at 04/09/2024 0941 Last data filed at 04/09/2024 0931 Gross per 24 hour  Intake 480 ml  Output --  Net 480 ml       Labs: RENAL PANEL Recent Labs  Lab 04/04/24 0409 04/05/24 0429 04/05/24 0905 04/06/24 0542 04/06/24 0818 04/06/24 1502 04/06/24 1516 04/07/24 0103 04/07/24 0945 04/07/24 1624 04/08/24 0413 04/09/24 0118  NA 127* 119*   < > 123*  --  129*   < > 132* 131* 130* 131* 129*  K 3.2* 3.3*   < > 4.7  --  4.3  --   --  3.9 3.9 4.0 3.8  CL 86* 80*   < > 87*  --  89*  --   --  90* 90* 91* 88*  CO2 33* 31   < > 22  --  25  --   --  33* 26 28 25   GLUCOSE 95 116*   < > 81  --  97  --   --  137* 102* 90 125*  BUN 16 15   < > 18  --  18  --   --  14 19 16 23   CREATININE 0.75 0.79   < > 0.72  --  0.85  --   --  0.85 0.89 0.87 1.18*  CALCIUM 8.1* 8.4*   < > 8.6*  --  8.9  --   --  9.1 8.9 9.1 9.4  MG 2.0 2.1  --  2.0  --   --   --   --   --   --  2.0 2.0  PHOS 3.9  --   --   --   --   --   --   --   --   --   --   --   ALBUMIN  --   --   --   --  3.5  --   --   --   --   --   --   --    < > = values in this interval not displayed.    Liver Function Tests: Recent Labs  Lab 04/06/24 0818  AST 31  ALT 23  ALKPHOS 86  BILITOT 0.2  PROT 6.9  ALBUMIN 3.5   No results for input(s): LIPASE, AMYLASE in the last 168 hours. No results for input(s): AMMONIA in the last 168 hours. CBC: Recent Labs    03/24/24 1118 03/24/24 1723 03/25/24 0552 03/25/24 1118 04/03/24 1232 04/04/24 0409 04/06/24 0542 04/07/24 0103 04/09/24 0118  HGB  --   --    < >  --  10.0* 9.7* 10.4* 11.1* 12.3  MCV  --   --    < >  --  90.1 88.3 89.6 90.2 88.4  VITAMINB12  --  1,127*  --  1,182*  --   --   --   --   --   FOLATE 7.6 11.6  --   --   --   --   --    --   --   FERRITIN  --   --   --  301  --   --   --   --   --   TIBC  --   --   --  248*  --   --   --   --   --   IRON  --   --   --  16*  --   --   --   --   --    < > = values in this interval not displayed.    Cardiac Enzymes: No results for input(s): CKTOTAL, CKMB, CKMBINDEX, TROPONINI in the last 168 hours. CBG: Recent Labs  Lab 04/09/24 0109  GLUCAP 117*    Iron Studies: No results for input(s): IRON, TIBC, TRANSFERRIN, FERRITIN in the last 72 hours. Studies/Results: CT HEAD WO CONTRAST ( ) Result Date: 04/08/2024 EXAM: CT HEAD WITHOUT CONTRAST 04/08/2024 10:19:48 PM TECHNIQUE: CT of the head was performed without the administration of intravenous contrast. Automated exposure control, iterative reconstruction, and/or weight based adjustment of the mA/kV was utilized to reduce the radiation dose to as low as reasonably achievable. COMPARISON: None available. CLINICAL HISTORY: fall, witnessed, on Blood thinner FINDINGS: BRAIN AND VENTRICLES: Subcortical and periventricular small vessel ischemic changes. No acute hemorrhage. No evidence of acute infarct. No hydrocephalus. No extra-axial collection. No mass effect or midline shift. ORBITS: No acute abnormality. SINUSES: No acute abnormality. SOFT TISSUES AND SKULL: Soft tissue swelling with moderate hematoma overlying the right parietal vertex (image 67). No skull fracture. IMPRESSION: 1. Soft tissue swelling with moderate hematoma overlying the right parietal vertex. 2. No acute intracranial abnormality. Electronically signed by: Glenda Pebbles MD MD 04/08/2024  10:22 PM EST RP Workstation: [id number]    Medications: Infusions:    Scheduled Medications:  apixaban   5 mg Oral BID   Chlorhexidine  Gluconate Cloth  6 each Topical Daily   diltiazem   180 mg Oral Daily   ipratropium-albuterol   3 mL Nebulization TID   oseltamivir   30 mg Oral BID   torsemide   80 mg Oral Daily    have reviewed scheduled and prn  medications.  Physical Exam:    General elderly female in bed in no acute distress HEENT normocephalic atraumatic extraocular movements intact sclera anicteric Neck supple trachea midline Lungs clear to auscultation bilaterally normal work of breathing at rest ; on 1.5 liter oxygen Heart S1S2 no rub Abdomen soft nontender nondistended Extremities no edema appreciated; poor skin turgor - feet Psych normal mood and affect Neuro alert and conversant; provides hx and follows commands  [facility] Nguyen Glenda Nguyen 04/09/2024,9:55 AM  LOS: 6 days

## 2024-04-09 NOTE — Progress Notes (Signed)
 "          PROGRESS NOTE  SPENSER Nguyen FMW:[id number] DOB: 01/27/1935 DOA: 04/03/2024 PCP: Vick Lurie, FNP (Inactive)  Brief History:  89 year old female with a history of HFpEF, paroxysmal atrial fibrillation, hypertension, hyperlipidemia, prediabetes presenting with worsening lower extremity edema and coughing.  The patient was recently admitted to the hospital from 03/24/2024 to 03/31/2024 for hyponatremia, acute HFpEF, and new onset atrial fibrillation with RVR.  She was discharged home with torsemide  30 mg daily, Cardizem  CD 120 milligrams daily, and metoprolol  12.5 mg twice daily. The patient returns because of worsening lower extremity edema and cough.  She has had some difficulty ambulating secondary to pain associated with swelling of her legs and feet.  She denies any fevers, chills, chest pain, shortness breath, hemoptysis, nausea, vomiting, diarrhea, abdominal pain.  In the ED, she was afebrile and hemodynamically stable.  Initially she was tachycardic in the 120s.  She was started on IV furosemide . Unfortunately, the patient remained fluid overloaded, and her atrial fibrillation continued with rapid response.  She was placed on IV diltiazem  drip.  She was transition to the stepdown unit.  Her furosemide  IV doses were escalated.  Unfortunately, the patient's sodium decreased down to 122.  Nephrology was consulted.  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  The patient was given tolvaptan  for treatment of SIADH.  Her furosemide  was increased to 80 mg IV twice daily.  She became euvolemic.  Her sodium improved and remained stable.  She was transition to oral torsemide .  She was transition to oral diltiazem  once her heart rates gradually improved.   Assessment/Plan:  Acute on chronic HFpEF - 04/03/2024 CTA chest--negative PE, trace bilateral pleural effusions, left greater than right, biapical scarring,RUL calcified granuloma - 03/25/2024 echo EF 65  to 70%, no WMA, normal RVF - Continue lasix  to 40 mg IV bid>>increase to 80 mg BID - 04/08/24 transition to torsemide  80 mg daily>>holding 1/12   Persistent atrial fibrillation with RVR - Continue apixaban >>hold temporarily as pt fell has scalp hematoma - 04/04/24--developed RVR>>started IV diltiazem  - 04/05/24--transition back to po diltiazem  - 03/25/24 Echo EF 65-70%, no WMA, normal RVF - 04/08/24--transitioned to Cardizem  CD 180 mg  Orthostasis/vasovagal syncope -pt had syncopal episode after standing 1/11 eveing - near syncope with PT 1/12 - holding torsemide  for now  Hyponatremia -04/05/24--Na down 127>>122>>131>>129 (after tolvaptan ) - due to volume overload and SIAHD -required tolvaptan  last hospitalization -nephrology consult appreciated>>redose tovalptan 1/9 -check serum Osm--266 -urine Osm 581 -urine Na 69 - Na stable 129-131 last 24-48 hours   Influenza Bronchitis -1/9 personally reviewed CXR--increased interstitial markings -PCT 0.14 - started oseltamivir  1/8--finished 5 days   Elevated troponin - Secondary to demand ischemia - No chest pain presently - Personally reviewed EKG--atrial fibrillation with nonspecific T changes   Large right hilar lymph node measuring 14 mm -Possibly reactive, will need to be monitored - Outpatient surveillance and possible PET scan   Generalized weakness PT OT evaluation>>HHPT Fall precautions.   anion gap metabolic acidosis Serum bicarb 20, anion gap 17 on day of admission -improved   Hypokalemia - Repleted - Check magnesium  2.0    Family Communication: no  Family at bedside   Consultants:  none   Code Status:  FULL    DVT Prophylaxis:  apixaban --now holding     Procedures: As Listed in Progress Note Above   Antibiotics: Ceftriaxone  1/7>>1/8 Azithro 1/7      Subjective: Pt complains some lightheaded.  Denies focal extremity weakness, visual disturbance, dysesthesia.  No cp, sob, abd pain, f/c.  No leg or  pelvic pain  Objective: Vitals:   04/09/24 0512 04/09/24 0618 04/09/24 0704 04/09/24 0926  BP: 113/62 124/68  119/76  Pulse: 93 (!) 105  96  Resp: 19 18  20   Temp: 98.2 F (36.8 C)   98.6 F (37 C)  TempSrc: Oral   Oral  SpO2: 96% 98% 100% 97%  Weight:      Height:        Intake/Output Summary (Last 24 hours) at 04/09/2024 1122 Last data filed at 04/09/2024 0931 Gross per 24 hour  Intake 480 ml  Output --  Net 480 ml   Weight change: 0.4 kg Exam:  General:  Pt is alert, follows commands appropriately, not in acute distress HEENT: No icterus, No thrush, No neck mass, [place]/AT Cardiovascular: IRRR, S1/S2, no rubs, no gallops Respiratory: CTA bilaterally, no wheezing, no crackles, no rhonchi Abdomen: Soft/+BS, non tender, non distended, no guarding Extremities: No edema, No lymphangitis, No petechiae, No rashes, no synovitis   Data Reviewed: I have personally reviewed following labs and imaging studies Basic Metabolic Panel: Recent Labs  Lab 04/04/24 0409 04/05/24 0429 04/05/24 0905 04/06/24 0542 04/06/24 1502 04/06/24 1516 04/07/24 0103 04/07/24 0945 04/07/24 1624 04/08/24 0413 04/09/24 0118  NA 127* 119*   < > 123* 129*   < > 132* 131* 130* 131* 129*  K 3.2* 3.3*   < > 4.7 4.3  --   --  3.9 3.9 4.0 3.8  CL 86* 80*   < > 87* 89*  --   --  90* 90* 91* 88*  CO2 33* 31   < > 22 25  --   --  33* 26 28 25   GLUCOSE 95 116*   < > 81 97  --   --  137* 102* 90 125*  BUN 16 15   < > 18 18  --   --  14 19 16 23   CREATININE 0.75 0.79   < > 0.72 0.85  --   --  0.85 0.89 0.87 1.18*  CALCIUM 8.1* 8.4*   < > 8.6* 8.9  --   --  9.1 8.9 9.1 9.4  MG 2.0 2.1  --  2.0  --   --   --   --   --  2.0 2.0  PHOS 3.9  --   --   --   --   --   --   --   --   --   --    < > = values in this interval not displayed.   Liver Function Tests: Recent Labs  Lab 04/06/24 0818  AST 31  ALT 23  ALKPHOS 86  BILITOT 0.2  PROT 6.9  ALBUMIN 3.5   No results for input(s): LIPASE, AMYLASE in  the last 168 hours. No results for input(s): AMMONIA in the last 168 hours. Coagulation Profile: No results for input(s): INR, PROTIME in the last 168 hours. CBC: Recent Labs  Lab 04/03/24 1232 04/04/24 0409 04/06/24 0542 04/07/24 0103 04/09/24 0118  WBC 5.8 5.1 3.8* 4.2 11.2*  NEUTROABS 4.9  --   --   --   --   HGB 10.0* 9.7* 10.4* 11.1* 12.3  HCT 29.2* 27.8* 30.3* 33.3* 35.9*  MCV 90.1 88.3 89.6 90.2 88.4  PLT 368 362 359 375 449*   Cardiac Enzymes: No results for input(s): CKTOTAL, CKMB, CKMBINDEX, TROPONINI in the  last 168 hours. BNP: Invalid input(s): POCBNP CBG: Recent Labs  Lab 04/09/24 0109  GLUCAP 117*   HbA1C: No results for input(s): HGBA1C in the last 72 hours. Urine analysis:    Component Value Date/Time   COLORURINE YELLOW 03/24/2024 1038   APPEARANCEUR CLEAR 03/24/2024 1038   LABSPEC 1.020 03/24/2024 1038   PHURINE 6.0 03/24/2024 1038   GLUCOSEU NEGATIVE 03/24/2024 1038   HGBUR MODERATE (A) 03/24/2024 1038   BILIRUBINUR NEGATIVE 03/24/2024 1038   KETONESUR 5 (A) 03/24/2024 1038   PROTEINUR 30 (A) 03/24/2024 1038   NITRITE NEGATIVE 03/24/2024 1038   LEUKOCYTESUR NEGATIVE 03/24/2024 1038   Sepsis Labs: @LABRCNTIP (procalcitonin:4,lacticidven:4) ) Recent Results (from the past 240 hours)  Expectorated Sputum Assessment w Gram Stain, Rflx to Resp Cult     Status: None   Collection Time: 04/04/24  4:30 PM   Specimen: Sputum  Result Value Ref Range Status   Specimen Description SPUTUM  Final   Special Requests Normal  Final   Sputum evaluation   Final    THIS SPECIMEN IS ACCEPTABLE FOR SPUTUM CULTURE Performed at [facility], [address], [place], [place] [address]    Report Status 04/04/2024 FINAL  Final  Culture, Respiratory w Gram Stain     Status: None   Collection Time: 04/04/24  4:30 PM   Specimen: SPU  Result Value Ref Range Status   Specimen Description   Final    SPUTUM Performed at [facility], [address] [place], [place] [address]    Special Requests   Final    Normal Reflexed from [phone] Performed at [facility], [address] [place], [place] [address]    Gram Stain   Final    ABUNDANT SQUAMOUS EPITHELIAL CELLS PRESENT NO WBC SEEN FEW GRAM POSITIVE RODS RARE YEAST RARE GRAM POSITIVE COCCI RARE GRAM NEGATIVE RODS    Culture   Final    FEW Normal respiratory flora-no Staph aureus or Pseudomonas seen Performed at [facility], [address], [place], [place] [address]    Report Status 04/08/2024 FINAL  Final  Resp panel by RT-PCR (RSV, Flu A&B, Covid) Nasal Mucosa     Status: Abnormal   Collection Time: 04/04/24  6:34 PM   Specimen: Nasal Mucosa; Nasal Swab  Result Value Ref Range Status   SARS Coronavirus 2 by RT PCR NEGATIVE NEGATIVE Final    Comment: (NOTE) SARS-CoV-2 target nucleic acids are NOT DETECTED.  The SARS-CoV-2 RNA is generally detectable in upper respiratory specimens during the acute phase of infection. The lowest concentration of SARS-CoV-2 viral copies this assay can detect is 138 copies/mL. A negative result does not preclude SARS-Cov-2 infection and should not be used as the sole basis for treatment or other patient management decisions. A negative result may occur with  improper specimen collection/handling, submission of specimen other than nasopharyngeal swab, presence of viral mutation(s) within the areas targeted by this assay, and inadequate number of viral copies(<138 copies/mL). A negative result must be combined with clinical observations, patient history, and epidemiological information. The expected result is Negative.  Fact Sheet for Patients:  bloggercourse.com  Fact Sheet for Healthcare Providers:  seriousbroker.it  This test is no t yet approved or cleared by the [place]  FDA and  has been authorized for detection and/or diagnosis of SARS-CoV-2 by FDA under an Emergency Use  Authorization (EUA). This EUA will remain  in effect (meaning this test can be used) for the duration of the COVID-19 declaration under  Section 564(b)(1) of the Act, 21 U.S.C.section 360bbb-3(b)(1), unless the authorization is terminated  or revoked sooner.       Influenza A by PCR POSITIVE (A) NEGATIVE Final   Influenza B by PCR NEGATIVE NEGATIVE Final    Comment: (NOTE) The Xpert Xpress SARS-CoV-2/FLU/RSV plus assay is intended as an aid in the diagnosis of influenza from Nasopharyngeal swab specimens and should not be used as a sole basis for treatment. Nasal washings and aspirates are unacceptable for Xpert Xpress SARS-CoV-2/FLU/RSV testing.  Fact Sheet for Patients: bloggercourse.com  Fact Sheet for Healthcare Providers: seriousbroker.it  This test is not yet approved or cleared by the [place]  FDA and has been authorized for detection and/or diagnosis of SARS-CoV-2 by FDA under an Emergency Use Authorization (EUA). This EUA will remain in effect (meaning this test can be used) for the duration of the COVID-19 declaration under Section 564(b)(1) of the Act, 21 U.S.C. section 360bbb-3(b)(1), unless the authorization is terminated or revoked.     Resp Syncytial Virus by PCR NEGATIVE NEGATIVE Final    Comment: (NOTE) Fact Sheet for Patients: bloggercourse.com  Fact Sheet for Healthcare Providers: seriousbroker.it  This test is not yet approved or cleared by the [place]  FDA and has been authorized for detection and/or diagnosis of SARS-CoV-2 by FDA under an Emergency Use Authorization (EUA). This EUA will remain in effect (meaning this test can be used) for the duration of the COVID-19 declaration under Section 564(b)(1) of the Act, 21 U.S.C. section 360bbb-3(b)(1), unless the authorization is terminated or revoked.  Performed at [facility], [address] [place], [place] [address]   Respiratory (~20 pathogens) panel by PCR     Status: Abnormal   Collection Time: 04/04/24  6:34 PM   Specimen: Nasopharyngeal Swab; Respiratory  Result Value Ref Range Status   Adenovirus NOT DETECTED NOT DETECTED Final   Coronavirus 229E NOT DETECTED NOT DETECTED Final    Comment: (NOTE) The Coronavirus on the Respiratory Panel, DOES NOT test for the novel  Coronavirus (2019 nCoV)    Coronavirus HKU1 NOT DETECTED NOT DETECTED Final   Coronavirus NL63 NOT DETECTED NOT DETECTED Final   Coronavirus OC43 NOT DETECTED NOT DETECTED Final   Metapneumovirus NOT DETECTED NOT DETECTED Final   Rhinovirus / Enterovirus NOT DETECTED NOT DETECTED Final   Influenza A H3 DETECTED (A) NOT DETECTED Final   Influenza B NOT DETECTED NOT DETECTED Final   Parainfluenza Virus 1 NOT DETECTED NOT DETECTED Final   Parainfluenza Virus 2 NOT DETECTED NOT DETECTED Final   Parainfluenza Virus 3 NOT DETECTED NOT DETECTED Final   Parainfluenza Virus 4 NOT DETECTED NOT DETECTED Final   Respiratory Syncytial Virus NOT DETECTED NOT DETECTED Final   Bordetella pertussis NOT DETECTED NOT DETECTED Final   Bordetella Parapertussis NOT DETECTED NOT DETECTED Final   Chlamydophila pneumoniae NOT DETECTED NOT DETECTED Final   Mycoplasma pneumoniae NOT DETECTED NOT DETECTED Final    Comment: Performed at [facility], [address] [place], [place] [address]  MRSA Next Gen by PCR, Nasal     Status: None   Collection Time: 04/04/24  6:34 PM   Specimen: Nasal Mucosa; Nasal Swab  Result Value Ref Range Status   MRSA by PCR Next Gen NOT DETECTED NOT DETECTED Final    Comment: (NOTE) The GeneXpert MRSA Assay (FDA approved for NASAL specimens only), is one component of a comprehensive MRSA colonization surveillance program. It is not intended to diagnose MRSA infection nor to guide  or monitor treatment for MRSA infections. Test performance is not FDA approved in patients less than 16  years old. Performed at [facility], [address], [place], [place] [address]      Scheduled Meds:  Chlorhexidine  Gluconate Cloth  6 each Topical Daily   diltiazem   180 mg Oral Daily   ipratropium-albuterol   3 mL Nebulization TID   oseltamivir   30 mg Oral BID   Continuous Infusions:  Procedures/Studies: CT HEAD WO CONTRAST ( ) Result Date: 04/08/2024 EXAM: CT HEAD WITHOUT CONTRAST 04/08/2024 10:19:48 PM TECHNIQUE: CT of the head was performed without the administration of intravenous contrast. Automated exposure control, iterative reconstruction, and/or weight based adjustment of the mA/kV was utilized to reduce the radiation dose to as low as reasonably achievable. COMPARISON: None available. CLINICAL HISTORY: fall, witnessed, on Blood thinner FINDINGS: BRAIN AND VENTRICLES: Subcortical and periventricular small vessel ischemic changes. No acute hemorrhage. No evidence of acute infarct. No hydrocephalus. No extra-axial collection. No mass effect or midline shift. ORBITS: No acute abnormality. SINUSES: No acute abnormality. SOFT TISSUES AND SKULL: Soft tissue swelling with moderate hematoma overlying the right parietal vertex (image 67). No skull fracture. IMPRESSION: 1. Soft tissue swelling with moderate hematoma overlying the right parietal vertex. 2. No acute intracranial abnormality. Electronically signed by: Pinkie Pebbles MD MD 04/08/2024 10:22 PM EST RP Workstation: [id number]   DG CHEST PORT 1 VIEW Result Date: 04/05/2024 CLINICAL DATA:  Acute on chronic heart failure. Influenza bronchitis. EXAM: PORTABLE CHEST 1 VIEW COMPARISON:  04/03/2024 FINDINGS: Mildly decreased inspiration. Stable enlarged cardiac silhouette and mildly tortuous thoracic aorta. Mild chronic interstitial prominence. Minimal left lower lobe atelectasis. Diffuse osteopenia. IMPRESSION: 1. Minimal left lower lobe atelectasis. 2. Stable cardiomegaly and mild chronic interstitial disease. Electronically Signed   By:  Elspeth Bathe M.D.   On: 04/05/2024 13:45   CT Angio Chest PE W and/or Wo Contrast Result Date: 04/03/2024 EXAM: CTA CHEST 04/03/2024 06:44:08 PM TECHNIQUE: CTA of the chest was performed without and with the administration of 75 mL of iohexol  (OMNIPAQUE ) 350 MG/ML injection. Multiplanar reformatted images are provided for review. MIP images are provided for review. Automated exposure control, iterative reconstruction, and/or weight based adjustment of the mA/kV was utilized to reduce the radiation dose to as low as reasonably achievable. COMPARISON: None available. CLINICAL HISTORY: Pulmonary embolism (PE) suspected, high prob. FINDINGS: PULMONARY ARTERIES: Pulmonary arteries are adequately opacified for evaluation. No acute pulmonary embolus. Main pulmonary artery is normal in caliber. MEDIASTINUM: The heart and pericardium demonstrate no acute abnormality. There are atherosclerotic calcifications of the aorta. LYMPH NODES: Large right hilar lymph node measuring 14 mm. No mediastinal or axillary lymphadenopathy. LUNGS AND PLEURA: There is a trace of bilateral pleural effusions, left greater than right. No pneumothorax. There is atelectasis in the lingula. There is some scarring in both lung apices. There are calcified granulomas in the right upper lobe. The lungs are otherwise clear. UPPER ABDOMEN: Limited images of the upper abdomen are unremarkable. SOFT TISSUES AND BONES: No acute bone or soft tissue abnormality. IMPRESSION: 1. No pulmonary embolism. 2. Large right hilar lymph node measuring 14 mm. 3. Trace bilateral pleural effusions, left greater than right. Electronically signed by: Greig Pique MD 04/03/2024 07:28 PM EST RP Workstation: [id number]   DG Foot Complete Left Result Date: 04/03/2024 CLINICAL DATA:  Bruising and edema of left foot. EXAM: LEFT FOOT - COMPLETE 3+ VIEW COMPARISON:  None Available. FINDINGS: There is no evidence of acute fracture or dislocation. There is no evidence of  arthropathy  or other focal bone abnormality. Soft tissues are unremarkable. IMPRESSION: Negative. Electronically Signed   By: Marcey Moan M.D.   On: 04/03/2024 16:45   DG Chest 2 View Result Date: 04/03/2024 CLINICAL DATA:  Lower extremity swelling. EXAM: CHEST - 2 VIEW COMPARISON:  March 24, 2024 FINDINGS: The cardiac silhouette is mildly enlarged and unchanged in size. Mild, chronic appearing increased interstitial lung markings are seen without evidence of focal consolidation or pneumothorax. A small left pleural effusion is suspected. A deformity of indeterminate age is seen along the distal right clavicle. Multilevel degenerative changes are present throughout the thoracic spine. IMPRESSION: 1. Chronic appearing increased interstitial lung markings without evidence of acute or active cardiopulmonary disease. 2. Small left pleural effusion. 3. Deformity of indeterminate age along the distal right clavicle. Correlation with physical examination is recommended to determine the presence of point tenderness. Electronically Signed   By: Suzen Dials M.D.   On: 04/03/2024 13:11   US  Venous Img Upper Uni Left (DVT) Result Date: 03/27/2024 CLINICAL DATA:  Left upper extremity edema EXAM: LEFT UPPER EXTREMITY VENOUS DOPPLER ULTRASOUND TECHNIQUE: Gray-scale sonography with graded compression, as well as color Doppler and duplex ultrasound were performed to evaluate the upper extremity deep venous system from the level of the subclavian vein and including the jugular, axillary, basilic, radial, ulnar and upper cephalic vein. Spectral Doppler was utilized to evaluate flow at rest and with distal augmentation maneuvers. COMPARISON:  None Available. FINDINGS: Contralateral Subclavian Vein: Respiratory phasicity is normal and symmetric with the symptomatic side. No evidence of thrombus. Normal compressibility. Internal Jugular Vein: No evidence of thrombus. Normal compressibility, respiratory phasicity and response to  augmentation. Subclavian Vein: No evidence of thrombus. Normal compressibility, respiratory phasicity and response to augmentation. Axillary Vein: No evidence of thrombus. Normal compressibility, respiratory phasicity and response to augmentation. Cephalic Vein: Left cephalic vein at the antecubital fossa demonstrates ill-defined hypoechoic intraluminal thrombus and is noncompressible compatible with superficial thrombosis/thrombophlebitis. Basilic Vein: No evidence of thrombus. Normal compressibility, respiratory phasicity and response to augmentation. Brachial Veins: No evidence of thrombus. Normal compressibility, respiratory phasicity and response to augmentation. Radial Veins: No evidence of thrombus. Normal compressibility, respiratory phasicity and response to augmentation. Ulnar Veins: No evidence of thrombus. Normal compressibility, respiratory phasicity and response to augmentation. IMPRESSION: 1. Negative for left upper extremity DVT. 2. Positive for left cephalic vein superficial thrombosis/thrombophlebitis at the antecubital fossa. Electronically Signed   By: CHRISTELLA.  Shick M.D.   On: 03/27/2024 10:30   ECHOCARDIOGRAM COMPLETE Result Date: 03/25/2024    ECHOCARDIOGRAM REPORT   Patient Name:   Glenda Nguyen Date of Exam: 03/25/2024 Medical Rec #:  [id number]           Height:       67.0 in Accession #:    [id number]          Weight:       188.3 lb Date of Birth:  [birth year]           BSA:          1.971 m Patient Age:    89 years            BP:           126/57 mmHg Patient Gender: F                   HR:           102 bpm. Exam Location:  Inpatient Procedure: 2D Echo, Cardiac  Doppler and Color Doppler (Both Spectral and Color            Flow Doppler were utilized during procedure). Indications:    Atrial fibrillation  History:        Patient has no prior history of Echocardiogram examinations.                 Arrythmias:Atrial Fibrillation; Risk Factors:Hypertension.  Sonographer:    Philomena Daring  Referring Phys: [email or web address] Donja Tipping IMPRESSIONS  1. Left ventricular ejection fraction, by estimation, is 65 to 70%. The left ventricle has normal function. The left ventricle has no regional wall motion abnormalities. There is mild concentric left ventricular hypertrophy. Left ventricular diastolic parameters are indeterminate.  2. Right ventricular systolic function is normal. The right ventricular size is normal.  3. The mitral valve is normal in structure. No evidence of mitral valve regurgitation. No evidence of mitral stenosis.  4. The aortic valve was not well visualized. There is mild calcification of the aortic valve. Aortic valve regurgitation is mild. Aortic valve sclerosis is present, with no evidence of aortic valve stenosis.  5. The inferior vena cava is normal in size with greater than 50% respiratory variability, suggesting right atrial pressure of 3 mmHg. Comparison(s): No prior Echocardiogram. FINDINGS  Left Ventricle: Left ventricular ejection fraction, by estimation, is 65 to 70%. The left ventricle has normal function. The left ventricle has no regional wall motion abnormalities. The left ventricular internal cavity size was normal in size. There is  mild concentric left ventricular hypertrophy. Left ventricular diastolic parameters are indeterminate. Right Ventricle: The right ventricular size is normal. No increase in right ventricular wall thickness. Right ventricular systolic function is normal. Left Atrium: Left atrial size was normal in size. Right Atrium: Right atrial size was normal in size. Pericardium: There is no evidence of pericardial effusion. Mitral Valve: The mitral valve is normal in structure. No evidence of mitral valve regurgitation. No evidence of mitral valve stenosis. Tricuspid Valve: The tricuspid valve is normal in structure. Tricuspid valve regurgitation is mild . No evidence of tricuspid stenosis. Aortic Valve: The aortic valve was not well visualized. There is mild  calcification of the aortic valve. Aortic valve regurgitation is mild. Aortic regurgitation PHT measures 465 msec. Aortic valve sclerosis is present, with no evidence of aortic valve stenosis. Pulmonic Valve: The pulmonic valve was normal in structure. Pulmonic valve regurgitation is mild. No evidence of pulmonic stenosis. Aorta: The aortic root and ascending aorta are structurally normal, with no evidence of dilitation. Venous: The inferior vena cava is normal in size with greater than 50% respiratory variability, suggesting right atrial pressure of 3 mmHg. IAS/Shunts: No atrial level shunt detected by color flow Doppler.  LEFT VENTRICLE PLAX 2D LVIDd:         3.90 cm   Diastology LVIDs:         2.60 cm   LV e' medial:    10.23 cm/s LV PW:         1.10 cm   LV E/e' medial:  9.5 LV IVS:        1.10 cm   LV e' lateral:   14.80 cm/s LVOT diam:     1.80 cm   LV E/e' lateral: 6.5 LV SV:         42 LV SV Index:   22 LVOT Area:     2.54 cm  RIGHT VENTRICLE             IVC RV  Basal diam:  2.90 cm     IVC diam: 1.40 cm RV Mid diam:    2.20 cm RV S prime:     14.00 cm/s TAPSE (M-mode): 2.2 cm LEFT ATRIUM             Index        RIGHT ATRIUM           Index LA diam:        3.30 cm 1.67 cm/m   RA Area:     15.70 cm LA Vol (A2C):   46.8 ml 23.74 ml/m  RA Volume:   37.00 ml  18.77 ml/m LA Vol (A4C):   42.7 ml 21.66 ml/m LA Biplane Vol: 48.7 ml 24.71 ml/m  AORTIC VALVE LVOT Vmax:   129.33 cm/s LVOT Vmean:  83.367 cm/s LVOT VTI:    0.167 m AI PHT:      465 msec  AORTA Ao Root diam: 3.20 cm Ao Asc diam:  3.20 cm MITRAL VALVE               TRICUSPID VALVE MV Area (PHT): 4.67 cm    TR Peak grad:   30.9 mmHg MV Decel Time: 163 msec    TR Vmax:        278.00 cm/s MV E velocity: 96.90 cm/s                            SHUNTS                            Systemic VTI:  0.17 m                            Systemic Diam: 1.80 cm Stanly Leavens MD Electronically signed by Stanly Leavens MD Signature Date/Time:  03/25/2024/12:13:57 PM    Final    DG Chest 1 View Result Date: 03/24/2024 CLINICAL DATA:  Weakness and fall. EXAM: CHEST  1 VIEW COMPARISON:  None available. FINDINGS: The heart is mildly enlarged. No pulmonary vascular congestion. Mediastinal silhouette within normal limits. Lungs are clear. Deformity of the lateral RIGHT clavicle suspicious for fracture. IMPRESSION: 1. Mild cardiomegaly. 2. Deformity of the lateral RIGHT clavicle suspicious for fracture. Please correlate for focal tenderness. Electronically Signed   By: Aliene Lloyd M.D.   On: 03/24/2024 14:04   DG Knee Complete 4 Views Right Result Date: 03/24/2024 EXAM: 4 VIEW(S) XRAY OF THE KNEE 03/24/2024 11:07:00 AM COMPARISON: None available. CLINICAL HISTORY: fall FINDINGS: BONES AND JOINTS: No acute fracture. No malalignment. No significant joint effusion. Mild lateral compartment joint space narrowing. Marginal osteophytosis and subchondral sclerosis of the lateral compartment. Additional subtle marginal osteophytosis of the medial compartment. SOFT TISSUES: Mild diffuse soft tissue swelling. IMPRESSION: 1. No acute fracture or dislocation. 2. Mild diffuse soft tissue swelling. 3. Lateral compartment predominant osteoarthritis. Electronically signed by: Donnice Mania MD 03/24/2024 11:27 AM EST RP Workstation: [id number]   CT Cervical Spine Wo Contrast Result Date: 03/24/2024 EXAM: CT CERVICAL SPINE WITHOUT CONTRAST 03/24/2024 10:59:54 AM TECHNIQUE: CT of the cervical spine was performed without the administration of intravenous contrast. Multiplanar reformatted images are provided for review. Automated exposure control, iterative reconstruction, and/or weight based adjustment of the mA/kV was utilized to reduce the radiation dose to as low as reasonably achievable. COMPARISON: None available. CLINICAL HISTORY: Neck trauma (Age >= 65y) FINDINGS: BONES AND  ALIGNMENT: Straightening of the normal cervical lordosis. Trace degenerative  anterolisthesis of C2 on C3 and C3 on C4. Additional trace degenerative anterolisthesis of C7 on T1. No acute fracture or traumatic malalignment. DEGENERATIVE CHANGES: Moderate disc space narrowing at C4-C5. Additional disc space narrowing at C5-C6 and C6-C7. Mild degenerative endplate osteophytes at multiple levels. There are disc osteophyte complexes at multiple levels without evidence of high grade osseous spinal canal stenosis. Facet arthrosis and uncovertebral hypertrophy at multiple levels. SOFT TISSUES: No prevertebral soft tissue swelling. LUNGS: Scarring and atelectasis in the right lung apex. IMPRESSION: 1. No evidence of acute traumatic injury. 2. Degenerative changes as above. Electronically signed by: Donnice Mania MD 03/24/2024 11:25 AM EST RP Workstation: [id number]   CT Head Wo Contrast Result Date: 03/24/2024 EXAM: CT HEAD WITHOUT CONTRAST 03/24/2024 10:59:54 AM TECHNIQUE: CT of the head was performed without the administration of intravenous contrast. Automated exposure control, iterative reconstruction, and/or weight based adjustment of the mA/kV was utilized to reduce the radiation dose to as low as reasonably achievable. COMPARISON: None available. CLINICAL HISTORY: Head trauma, minor (Age >= 65y). FINDINGS: BRAIN AND VENTRICLES: No acute hemorrhage. 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 Mild chronic microvascular ischemic change. No evidence of acute infarct. No hydrocephalus. No extra-axial collection. No mass effect or midline shift. ORBITS: Bilateral lens replacement noted. SINUSES: Trace right mastoid effusion. SOFT TISSUES AND SKULL: Right periorbital soft tissue swelling. No skull fracture. IMPRESSION: 1. No acute intracranial abnormality. 2. Right periorbital soft tissue swelling. 3. Focal encephalomalacia along the anterolateral left temporal lobe, suggestive of small remote infarct or sequelae  of prior trauma. Electronically signed by: Donnice Mania MD 03/24/2024 11:18 AM EST RP Workstation: [id number]    Alm Schneider, DO  [facility]  If 7PM-7AM, please contact night-coverage [email or web address] Password [email or web address] 04/09/2024, 11:22 AM   LOS: 6 days   "

## 2024-04-10 ENCOUNTER — Other Ambulatory Visit: Payer: Self-pay

## 2024-04-10 DIAGNOSIS — I4891 Unspecified atrial fibrillation: Secondary | ICD-10-CM | POA: Diagnosis not present

## 2024-04-10 DIAGNOSIS — I5033 Acute on chronic diastolic (congestive) heart failure: Secondary | ICD-10-CM | POA: Diagnosis not present

## 2024-04-10 DIAGNOSIS — J09X2 Influenza due to identified novel influenza A virus with other respiratory manifestations: Secondary | ICD-10-CM | POA: Diagnosis not present

## 2024-04-10 LAB — MAGNESIUM: Magnesium: 1.9 mg/dL (ref 1.7–2.4)

## 2024-04-10 LAB — BASIC METABOLIC PANEL WITH GFR
Anion gap: 7 (ref 5–15)
BUN: 17 mg/dL (ref 8–23)
CO2: 32 mmol/L (ref 22–32)
Calcium: 8.9 mg/dL (ref 8.9–10.3)
Chloride: 91 mmol/L — ABNORMAL LOW (ref 98–111)
Creatinine, Ser: 0.81 mg/dL (ref 0.44–1.00)
GFR, Estimated: >60 mL/min
Glucose, Bld: 92 mg/dL (ref 70–99)
Potassium: 3.8 mmol/L (ref 3.5–5.1)
Sodium: 130 mmol/L — ABNORMAL LOW (ref 135–145)

## 2024-04-10 MED ORDER — POTASSIUM CHLORIDE CRYS ER 20 MEQ PO TBCR
20.0000 meq | EXTENDED_RELEASE_TABLET | Freq: Once | ORAL | Status: AC
  Administered 2024-04-10: 20 meq via ORAL
  Filled 2024-04-10: qty 1

## 2024-04-10 MED ORDER — AMIODARONE HCL IN DEXTROSE 360-4.14 MG/200ML-% IV SOLN
30.0000 mg/h | INTRAVENOUS | Status: DC
  Administered 2024-04-10 – 2024-04-12 (×4): 30 mg/h via INTRAVENOUS
  Filled 2024-04-10 (×3): qty 200

## 2024-04-10 MED ORDER — AMIODARONE HCL IN DEXTROSE 360-4.14 MG/200ML-% IV SOLN
30.0000 mg/h | INTRAVENOUS | Status: AC
  Administered 2024-04-10: 30 mg/h via INTRAVENOUS
  Filled 2024-04-10 (×2): qty 200

## 2024-04-10 MED ORDER — AMIODARONE LOAD VIA INFUSION
150.0000 mg | Freq: Once | INTRAVENOUS | Status: AC
  Administered 2024-04-10: 150 mg via INTRAVENOUS
  Filled 2024-04-10: qty 83.34

## 2024-04-10 MED ORDER — AMIODARONE HCL IN DEXTROSE 360-4.14 MG/200ML-% IV SOLN: 30.0000 mg/h | INTRAVENOUS | Status: DC

## 2024-04-10 MED ORDER — TORSEMIDE 20 MG PO TABS
40.0000 mg | ORAL_TABLET | Freq: Every day | ORAL | Status: DC
  Administered 2024-04-11 – 2024-04-14 (×4): 40 mg via ORAL
  Filled 2024-04-10 (×4): qty 2

## 2024-04-10 NOTE — Consult Note (Signed)
 "   CARDIOLOGY CONSULT NOTE    Patient ID: [id number] CRENSHAW; [id number]; [birth year]   Admit date: 04/03/2024 Date of Consult: 04/10/2024  Primary Care Provider: Vick Lurie, FNP Primary Cardiologist:  Primary Electrophysiologist:     Patient Profile:   REIGHLYN ELMES is a 89 y.o. female  who is being seen today for the evaluation of Afib with RVR at the request of Dr Tat.  History of Present Illness:   Ms. Linquist is a 89 year old F known to have HTN, HLD is currently admitted to hospitalist with management of acute bronchitis from influenza A infection.  Hospital course was complicated by intermittent atrial fibrillation with RVR and currently staying persistent with HR between 140 and 150s.  Patient symptomatic with palpitations.  Cardiology is consulted for the management of atrial fibrillation with RVR.  She had a fall prior to arrival to the ER.  CT head without contrast did not reveal any evidence of intracranial hemorrhage however she had soft tissue swelling with moderate hematoma overlying the right parietal vertex.  Eliquis  held.  She reported no dizziness or syncope leading to the fall.  She stated that she fell from the bed in her sleep.  Does not have any other symptoms of angina or DOE.  Echo normal in December 2025.  Past Medical History:  Diagnosis Date   High cholesterol    Hypertension    Urticaria     Past Surgical History:  Procedure Laterality Date   CATARACT EXTRACTION      Inpatient Medications: Scheduled Meds:  Chlorhexidine  Gluconate Cloth  6 each Topical Daily   diltiazem   180 mg Oral Daily   [START ON 04/11/2024] torsemide   40 mg Oral Daily   Continuous Infusions:  amiodarone  30 mg/hr (04/10/24 1101)   PRN Meds: benzonatate , guaiFENesin -dextromethorphan , ipratropium-albuterol , melatonin, mouth rinse, polyethylene glycol, prochlorperazine   Allergies:   Allergies[1]  Social History:   Social History   Socioeconomic History    Marital status: Widowed    Spouse name: Not on file   Number of children: Not on file   Years of education: Not on file   Highest education level: Not on file  Occupational History   Not on file  Tobacco Use   Smoking status: Never   Smokeless tobacco: Never  Vaping Use   Vaping status: Never Used  Substance and Sexual Activity   Alcohol  use: Never   Drug use: Never   Sexual activity: Not on file  Other Topics Concern   Not on file  Social History Narrative   Not on file   Social Drivers of Health   Tobacco Use: Low Risk (04/03/2024)   Patient History    Smoking Tobacco Use: Never    Smokeless Tobacco Use: Never    Passive Exposure: Not on file  Financial Resource Strain: Not on file  Food Insecurity: No Food Insecurity (04/03/2024)   Epic    Worried About Programme Researcher, Broadcasting/film/video in the Last Year: Never true    Ran Out of Food in the Last Year: Never true  Transportation Needs: No Transportation Needs (04/03/2024)   Epic    Lack of Transportation (Medical): No    Lack of Transportation (Non-Medical): No  Physical Activity: Not on file  Stress: Not on file  Social Connections: Unknown (04/03/2024)   Social Connection and Isolation Panel    Frequency of Communication with Friends and Family: More than three times a week    Frequency of Social Gatherings with Friends and  Family: More than three times a week    Attends Religious Services: 1 to 4 times per year    Active Member of Clubs or Organizations: No    Attends Banker Meetings: Never    Marital Status: Patient declined  Intimate Partner Violence: Not At Risk (04/03/2024)   Epic    Fear of Current or Ex-Partner: No    Emotionally Abused: No    Physically Abused: No    Sexually Abused: No  Depression (PHQ2-9): Not on file  Alcohol  Screen: Not on file  Housing: Unknown (04/04/2024)   Epic    Unable to Pay for Housing in the Last Year: Not on file    Number of Times Moved in the Last Year: 0    Homeless in the  Last Year: Not on file  Utilities: Not At Risk (04/03/2024)   Epic    Threatened with loss of utilities: No  Health Literacy: Not on file    Family History:    Family History  Problem Relation Age of Onset   Allergic rhinitis Neg Hx    Asthma Neg Hx    Eczema Neg Hx    Urticaria Neg Hx      ROS:  Please see the history of present illness.  ROS  All other ROS reviewed and negative.     Physical Exam/Data:   Vitals:   04/10/24 1100 04/10/24 1130 04/10/24 1200 04/10/24 1230  BP: 125/75 125/70 (!) 126/100 114/65  Pulse: (!) 109 100 (!) 57 96  Resp: 17 15 17  (!) 25  Temp:      TempSrc:      SpO2: 98% 95% 95% 96%  Weight:      Height:        Intake/Output Summary (Last 24 hours) at 04/10/2024 1309 Last data filed at 04/10/2024 0301 Gross per 24 hour  Intake --  Output 1100 ml  Net -1100 ml   Filed Weights   04/09/24 0430 04/10/24 0300 04/10/24 1000  Weight: 58.5 kg 64 kg 61.9 kg   Body mass index is 21.37 kg/m.  General:  Well nourished, well developed, in no acute distress HEENT: normal Lymph: no adenopathy Neck: JVD Endocrine:  No thryomegaly Vascular: No carotid bruits; FA pulses 2+ bilaterally without bruits  Cardiac: Irregular rate and rhythm, tachycardic Lungs:  clear to auscultation bilaterally, no wheezing, rhonchi or rales  Abd: soft, nontender, no hepatomegaly  Ext: no edema Musculoskeletal:  No deformities, BUE and BLE strength normal and equal Skin: warm and dry  Neuro:  CNs 2-12 intact, no focal abnormalities noted Psych:  Normal affect   Laboratory Data:  Chemistry Recent Labs  Lab 04/08/24 0413 04/09/24 0118 04/10/24 0547  NA 131* 129* 130*  K 4.0 3.8 3.8  CL 91* 88* 91*  CO2 28 25 32  GLUCOSE 90 125* 92  BUN 16 23 17   CREATININE 0.87 1.18* 0.81  CALCIUM 9.1 9.4 8.9  GFRNONAA >60 44* >60  ANIONGAP 13 16* 7    Recent Labs  Lab 04/06/24 0818  PROT 6.9  ALBUMIN 3.5  AST 31  ALT 23  ALKPHOS 86  BILITOT 0.2    Hematology Recent Labs  Lab 04/06/24 0542 04/07/24 0103 04/09/24 0118  WBC 3.8* 4.2 11.2*  RBC 3.38* 3.69* 4.06  HGB 10.4* 11.1* 12.3  HCT 30.3* 33.3* 35.9*  MCV 89.6 90.2 88.4  MCH 30.8 30.1 30.3  MCHC 34.3 33.3 34.3  RDW 14.8 14.9 14.8  PLT 359 375 449*  Cardiac EnzymesNo results for input(s): TROPONINI in the last 168 hours. No results for input(s): TROPIPOC in the last 168 hours.  BNPNo results for input(s): BNP, PROBNP in the last 168 hours.  DDimer No results for input(s): DDIMER in the last 168 hours.  Radiology/Studies:  CT HEAD WO CONTRAST ( ) Result Date: 04/08/2024 EXAM: CT HEAD WITHOUT CONTRAST 04/08/2024 10:19:48 PM TECHNIQUE: CT of the head was performed without the administration of intravenous contrast. Automated exposure control, iterative reconstruction, and/or weight based adjustment of the mA/kV was utilized to reduce the radiation dose to as low as reasonably achievable. COMPARISON: None available. CLINICAL HISTORY: fall, witnessed, on Blood thinner FINDINGS: BRAIN AND VENTRICLES: Subcortical and periventricular small vessel ischemic changes. No acute hemorrhage. No evidence of acute infarct. No hydrocephalus. No extra-axial collection. No mass effect or midline shift. ORBITS: No acute abnormality. SINUSES: No acute abnormality. SOFT TISSUES AND SKULL: Soft tissue swelling with moderate hematoma overlying the right parietal vertex (image 67). No skull fracture. IMPRESSION: 1. Soft tissue swelling with moderate hematoma overlying the right parietal vertex. 2. No acute intracranial abnormality. Electronically signed by: Pinkie Pebbles MD MD 04/08/2024 10:22 PM EST RP Workstation: [id number]    Assessment and Plan:   Atrial fibrillation with RVR - New onset per EKG from 04/04/2024. - Telemetry reviewed, in A-fib with RVR, HR 140 to 150s. - Symptomatic with palpitations intermittently. - Start amiodarone  drip, 150 mg bolus x 1 hour followed by 30 mg/h  maintenance. - Continue diltiazem  180 mg once daily. - On Eliquis  PTA but due to fall, on hold.  CT head without contrast upon arrival to the ER showed soft tissue swelling with moderate hematoma overlying the right parietal vertex.  No intracranial hemorrhage was noted. - Echo normal.  Acute on chronic diastolic heart failure - Volume overloaded based on labs and physical exam on admission. - On IV diuretics, switched to p.o. torsemide  40 mg once daily (on p.o. torsemide  30 mg daily at home).  Nephrology managing fluid status due to severe hyponatremia.  Normal serum creatinine.  Acute bronchitis from influenza A infection - Completed 5-day course of oseltamivir .  Supportive care per primary team.   60 minutes spent in reviewing prior medical records, reports, more than 3 labs, discussion and documentation.  For questions or updates, please contact [facility] Please consult [email or web address] for contact info under Cardiology/STEMI.   Signed, Rosamary Boudreau Priya Wilhelmenia Addis, MD 04/10/2024 1:09 PM     [1]  Allergies Allergen Reactions   Penicillins Rash   "

## 2024-04-10 NOTE — Progress Notes (Signed)
 Patient found to be in Afib RVR this morning,  apical HR check 130-140,tele monitor showed HR 160-170 at times. I checked on patient who states she did feel short of breath at times, denied chest pain. Notified MD. Manual blood pressure checked 118/62, MD came to room, checked on patient advised to move her to ICU. Patient made aware, asked me to call her daughter and leave a message on the machine. I did as asked per patient request, advising she was moved to ICU room 3.   Patient successfully transferred to ICU, no questions or concerns from ICU staff at this time.   Patient thankful for all help given this morning.

## 2024-04-10 NOTE — Progress Notes (Signed)
 "          PROGRESS NOTE  VAEDA WESTALL FMW:[id number] DOB: 09-14-1934 DOA: 04/03/2024 PCP: Vick Lurie, FNP (Inactive)  Brief History:  89 year old female with a history of HFpEF, paroxysmal atrial fibrillation, hypertension, hyperlipidemia, prediabetes presenting with worsening lower extremity edema and coughing.  The patient was recently admitted to the hospital from 03/24/2024 to 03/31/2024 for hyponatremia, acute HFpEF, and new onset atrial fibrillation with RVR.  She was discharged home with torsemide  30 mg daily, Cardizem  CD 120 milligrams daily, and metoprolol  12.5 mg twice daily. The patient returns because of worsening lower extremity edema and cough.  She has had some difficulty ambulating secondary to pain associated with swelling of her legs and feet.  She denies any fevers, chills, chest pain, shortness breath, hemoptysis, nausea, vomiting, diarrhea, abdominal pain.  In the ED, she was afebrile and hemodynamically stable.  Initially she was tachycardic in the 120s.  She was started on IV furosemide . Unfortunately, the patient remained fluid overloaded, and her atrial fibrillation continued with rapid response.  She was placed on IV diltiazem  drip.  She was transition to the stepdown unit.  Her furosemide  IV doses were escalated.  Unfortunately, the patient's sodium decreased down to 122.  Nephrology was consulted.  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  The patient was given tolvaptan  for treatment of SIADH.  Her furosemide  was increased to 80 mg IV twice daily.  She became euvolemic.  Her sodium improved and remained stable.  She was transition to oral torsemide .  She was transition to oral diltiazem .   Unfortunately, on the morning of 04/10/2024, the patient once again developed RVR. Due to repeated RVR episodes, cardiology was consulted to assist with management.  Patient sustained a fall in the evening around 04/08/24 due to orthostasis.  CT  brain showed scalp hematoma.  Apixaban  was held temporarily.   Assessment/Plan: Acute on chronic HFpEF - 04/03/2024 CTA chest--negative PE, trace bilateral pleural effusions, left greater than right, biapical scarring,RUL calcified granuloma - 03/25/2024 echo EF 65 to 70%, no WMA, normal RVF - Continue lasix  to 40 mg IV bid>>increase to 80 mg BID - 04/08/24 transition to torsemide  80 mg daily>>holding 1/12 due to orthostasis symptoms   Persistent atrial fibrillation with RVR - Continue apixaban >>hold temporarily as pt fell has scalp hematoma - 04/04/24--developed RVR>>started IV diltiazem  - 04/05/24--transition back to po diltiazem  - 03/25/24 Echo EF 65-70%, no WMA, normal RVF - 04/08/24--transitioned to Cardizem  CD 180 mg - 04/10/24 went back to RVR>>consult cardiology   Orthostasis/vasovagal syncope -pt had syncopal episode after standing 1/11 eveing - near syncope with PT 1/12 - 04/09/24 holding torsemide  for now   Hyponatremia -04/05/24--Na down 127>>122>>131>>129 (after tolvaptan ) - due to volume overload and SIAHD -required tolvaptan  last hospitalization -nephrology consult appreciated>>redose tovalptan 1/9 -check serum Osm--266 -urine Osm 581 -urine Na 69 - Na stable 129-131 last 24-48 hours   Influenza Bronchitis -1/9 personally reviewed CXR--increased interstitial markings -PCT 0.14 - started oseltamivir  1/8--finished 5 days   Elevated troponin - Secondary to demand ischemia - No chest pain presently - Personally reviewed EKG--atrial fibrillation with nonspecific T changes   Large right hilar lymph node measuring 14 mm -Possibly reactive, will need to be monitored - Outpatient surveillance and possible PET scan   Generalized weakness PT OT evaluation>>HHPT Fall precautions.   anion gap metabolic acidosis Serum bicarb 20, anion gap 17 on day of admission -improved   Hypokalemia - Repleted - Check magnesium  2.0  Family Communication: no  Family at bedside    Consultants:  none   Code Status:  FULL    DVT Prophylaxis:  apixaban --now holding due to fall with scalp hematoma     Procedures: As Listed in Progress Note Above   Antibiotics: Ceftriaxone  1/7>>1/8 Azithro 1/7          Subjective: Patient denies fevers, chills, headache, chest pain, dyspnea, nausea, vomiting, diarrhea, abdominal pain, dysuria, hematuria, hematochezia, and melena. She feels slightly dizzy this morning.   Objective: Vitals:   04/10/24 0049 04/10/24 0300 04/10/24 0729 04/10/24 0835  BP: (!) 100/56 99/68  118/62  Pulse:      Resp: 20 20    Temp: 98 F (36.7 C) 98.4 F (36.9 C)    TempSrc: Oral Oral    SpO2: 92% 92% 96%   Weight:  64 kg    Height:        Intake/Output Summary (Last 24 hours) at 04/10/2024 0917 Last data filed at 04/10/2024 0301 Gross per 24 hour  Intake 240 ml  Output 1100 ml  Net -860 ml   Weight change: 5.5 kg Exam:  General:  Pt is alert, follows commands appropriately, not in acute distress HEENT: No icterus, No thrush, No neck mass, Burns/AT Cardiovascular: IRRR, S1/S2, no rubs, no gallops Respiratory: CTA bilaterally, no wheezing, no crackles, no rhonchi Abdomen: Soft/+BS, non tender, non distended, no guarding Extremities: No edema, No lymphangitis, No petechiae, No rashes, no synovitis   Data Reviewed: I have personally reviewed following labs and imaging studies Basic Metabolic Panel: Recent Labs  Lab 04/04/24 0409 04/05/24 0429 04/05/24 0905 04/06/24 0542 04/06/24 1502 04/07/24 0945 04/07/24 1624 04/08/24 0413 04/09/24 0118 04/10/24 0547  NA 127* 119*   < > 123*   < > 131* 130* 131* 129* 130*  K 3.2* 3.3*   < > 4.7   < > 3.9 3.9 4.0 3.8 3.8  CL 86* 80*   < > 87*   < > 90* 90* 91* 88* 91*  CO2 33* 31   < > 22   < > 33* 26 28 25  32  GLUCOSE 95 116*   < > 81   < > 137* 102* 90 125* 92  BUN 16 15   < > 18   < > 14 19 16 23 17   CREATININE 0.75 0.79   < > 0.72   < > 0.85 0.89 0.87 1.18* 0.81  CALCIUM 8.1*  8.4*   < > 8.6*   < > 9.1 8.9 9.1 9.4 8.9  MG 2.0 2.1  --  2.0  --   --   --  2.0 2.0 1.9  PHOS 3.9  --   --   --   --   --   --   --   --   --    < > = values in this interval not displayed.   Liver Function Tests: Recent Labs  Lab 04/06/24 0818  AST 31  ALT 23  ALKPHOS 86  BILITOT 0.2  PROT 6.9  ALBUMIN 3.5   No results for input(s): LIPASE, AMYLASE in the last 168 hours. No results for input(s): AMMONIA in the last 168 hours. Coagulation Profile: No results for input(s): INR, PROTIME in the last 168 hours. CBC: Recent Labs  Lab 04/03/24 1232 04/04/24 0409 04/06/24 0542 04/07/24 0103 04/09/24 0118  WBC 5.8 5.1 3.8* 4.2 11.2*  NEUTROABS 4.9  --   --   --   --  HGB 10.0* 9.7* 10.4* 11.1* 12.3  HCT 29.2* 27.8* 30.3* 33.3* 35.9*  MCV 90.1 88.3 89.6 90.2 88.4  PLT 368 362 359 375 449*   Cardiac Enzymes: No results for input(s): CKTOTAL, CKMB, CKMBINDEX, TROPONINI in the last 168 hours. BNP: Invalid input(s): POCBNP CBG: Recent Labs  Lab 04/09/24 0109  GLUCAP 117*   HbA1C: No results for input(s): HGBA1C in the last 72 hours. Urine analysis:    Component Value Date/Time   COLORURINE YELLOW 03/24/2024 1038   APPEARANCEUR CLEAR 03/24/2024 1038   LABSPEC 1.020 03/24/2024 1038   PHURINE 6.0 03/24/2024 1038   GLUCOSEU NEGATIVE 03/24/2024 1038   HGBUR MODERATE (A) 03/24/2024 1038   BILIRUBINUR NEGATIVE 03/24/2024 1038   KETONESUR 5 (A) 03/24/2024 1038   PROTEINUR 30 (A) 03/24/2024 1038   NITRITE NEGATIVE 03/24/2024 1038   LEUKOCYTESUR NEGATIVE 03/24/2024 1038   Sepsis Labs: @LABRCNTIP (procalcitonin:4,lacticidven:4) ) Recent Results (from the past 240 hours)  Expectorated Sputum Assessment w Gram Stain, Rflx to Resp Cult     Status: None   Collection Time: 04/04/24  4:30 PM   Specimen: Sputum  Result Value Ref Range Status   Specimen Description SPUTUM  Final   Special Requests Normal  Final   Sputum evaluation   Final    THIS  SPECIMEN IS ACCEPTABLE FOR SPUTUM CULTURE Performed at [facility], [address] [place], [place] [address]    Report Status 04/04/2024 FINAL  Final  Culture, Respiratory w Gram Stain     Status: None   Collection Time: 04/04/24  4:30 PM   Specimen: SPU  Result Value Ref Range Status   Specimen Description   Final    SPUTUM Performed at [facility], [address] [place], [place] [address]    Special Requests   Final    Normal Reflexed from [id number] Performed at [facility], [address] [place], [place] [address]    Gram Stain   Final    ABUNDANT SQUAMOUS EPITHELIAL CELLS PRESENT NO WBC SEEN FEW GRAM POSITIVE RODS RARE YEAST RARE GRAM POSITIVE COCCI RARE GRAM NEGATIVE RODS    Culture   Final    FEW Normal respiratory flora-no Staph aureus or Pseudomonas seen Performed at [facility], [address], [place], [place] [address]    Report Status 04/08/2024 FINAL  Final  Resp panel by RT-PCR (RSV, Flu A&B, Covid) Nasal Mucosa     Status: Abnormal   Collection Time: 04/04/24  6:34 PM   Specimen: Nasal Mucosa; Nasal Swab  Result Value Ref Range Status   SARS Coronavirus 2 by RT PCR NEGATIVE NEGATIVE Final    Comment: (NOTE) SARS-CoV-2 target nucleic acids are NOT DETECTED.  The SARS-CoV-2 RNA is generally detectable in upper respiratory specimens during the acute phase of infection. The lowest concentration of SARS-CoV-2 viral copies this assay can detect is 138 copies/mL. A negative result does not preclude SARS-Cov-2 infection and should not be used as the sole basis for treatment or other patient management decisions. A negative result may occur with  improper specimen collection/handling, submission of specimen other than nasopharyngeal swab, presence of viral mutation(s) within the areas targeted by this assay, and inadequate number of viral copies(<138 copies/mL). A negative result must be combined with clinical observations, patient history, and  epidemiological information. The expected result is Negative.  Fact Sheet for Patients:  bloggercourse.com  Fact Sheet for Healthcare Providers:  seriousbroker.it  This test is no t yet approved or cleared by the [place]   FDA and  has been authorized for detection and/or diagnosis of SARS-CoV-2 by FDA under an Emergency Use Authorization (EUA). This EUA will remain  in effect (meaning this test can be used) for the duration of the COVID-19 declaration under Section 564(b)(1) of the Act, 21 U.S.C.section 360bbb-3(b)(1), unless the authorization is terminated  or revoked sooner.       Influenza A by PCR POSITIVE (A) NEGATIVE Final   Influenza B by PCR NEGATIVE NEGATIVE Final    Comment: (NOTE) The Xpert Xpress SARS-CoV-2/FLU/RSV plus assay is intended as an aid in the diagnosis of influenza from Nasopharyngeal swab specimens and should not be used as a sole basis for treatment. Nasal washings and aspirates are unacceptable for Xpert Xpress SARS-CoV-2/FLU/RSV testing.  Fact Sheet for Patients: bloggercourse.com  Fact Sheet for Healthcare Providers: seriousbroker.it  This test is not yet approved or cleared by the [place]  FDA and has been authorized for detection and/or diagnosis of SARS-CoV-2 by FDA under an Emergency Use Authorization (EUA). This EUA will remain in effect (meaning this test can be used) for the duration of the COVID-19 declaration under Section 564(b)(1) of the Act, 21 U.S.C. section 360bbb-3(b)(1), unless the authorization is terminated or revoked.     Resp Syncytial Virus by PCR NEGATIVE NEGATIVE Final    Comment: (NOTE) Fact Sheet for Patients: bloggercourse.com  Fact Sheet for Healthcare Providers: seriousbroker.it  This test is not yet approved or cleared by the [place]  FDA and has  been authorized for detection and/or diagnosis of SARS-CoV-2 by FDA under an Emergency Use Authorization (EUA). This EUA will remain in effect (meaning this test can be used) for the duration of the COVID-19 declaration under Section 564(b)(1) of the Act, 21 U.S.C. section 360bbb-3(b)(1), unless the authorization is terminated or revoked.  Performed at [facility], [address], [place], [place] [address]   Respiratory (~20 pathogens) panel by PCR     Status: Abnormal   Collection Time: 04/04/24  6:34 PM   Specimen: Nasopharyngeal Swab; Respiratory  Result Value Ref Range Status   Adenovirus NOT DETECTED NOT DETECTED Final   Coronavirus 229E NOT DETECTED NOT DETECTED Final    Comment: (NOTE) The Coronavirus on the Respiratory Panel, DOES NOT test for the novel  Coronavirus (2019 nCoV)    Coronavirus HKU1 NOT DETECTED NOT DETECTED Final   Coronavirus NL63 NOT DETECTED NOT DETECTED Final   Coronavirus OC43 NOT DETECTED NOT DETECTED Final   Metapneumovirus NOT DETECTED NOT DETECTED Final   Rhinovirus / Enterovirus NOT DETECTED NOT DETECTED Final   Influenza A H3 DETECTED (A) NOT DETECTED Final   Influenza B NOT DETECTED NOT DETECTED Final   Parainfluenza Virus 1 NOT DETECTED NOT DETECTED Final   Parainfluenza Virus 2 NOT DETECTED NOT DETECTED Final   Parainfluenza Virus 3 NOT DETECTED NOT DETECTED Final   Parainfluenza Virus 4 NOT DETECTED NOT DETECTED Final   Respiratory Syncytial Virus NOT DETECTED NOT DETECTED Final   Bordetella pertussis NOT DETECTED NOT DETECTED Final   Bordetella Parapertussis NOT DETECTED NOT DETECTED Final   Chlamydophila pneumoniae NOT DETECTED NOT DETECTED Final   Mycoplasma pneumoniae NOT DETECTED NOT DETECTED Final    Comment: Performed at [facility], [address] [place], [place] [address]  MRSA Next Gen by PCR, Nasal     Status: None   Collection Time: 04/04/24  6:34 PM   Specimen: Nasal Mucosa; Nasal Swab  Result Value Ref Range Status    MRSA by PCR Next  Gen NOT DETECTED NOT DETECTED Final    Comment: (NOTE) The GeneXpert MRSA Assay (FDA approved for NASAL specimens only), is one component of a comprehensive MRSA colonization surveillance program. It is not intended to diagnose MRSA infection nor to guide or monitor treatment for MRSA infections. Test performance is not FDA approved in patients less than 2 years old. Performed at [facility], [address], [place], [place] [address]      Scheduled Meds:  Chlorhexidine  Gluconate Cloth  6 each Topical Daily   diltiazem   180 mg Oral Daily   potassium chloride   20 mEq Oral Once   Continuous Infusions:  Procedures/Studies: CT HEAD WO CONTRAST ( ) Result Date: 04/08/2024 EXAM: CT HEAD WITHOUT CONTRAST 04/08/2024 10:19:48 PM TECHNIQUE: CT of the head was performed without the administration of intravenous contrast. Automated exposure control, iterative reconstruction, and/or weight based adjustment of the mA/kV was utilized to reduce the radiation dose to as low as reasonably achievable. COMPARISON: None available. CLINICAL HISTORY: fall, witnessed, on Blood thinner FINDINGS: BRAIN AND VENTRICLES: Subcortical and periventricular small vessel ischemic changes. No acute hemorrhage. No evidence of acute infarct. No hydrocephalus. No extra-axial collection. No mass effect or midline shift. ORBITS: No acute abnormality. SINUSES: No acute abnormality. SOFT TISSUES AND SKULL: Soft tissue swelling with moderate hematoma overlying the right parietal vertex (image 67). No skull fracture. IMPRESSION: 1. Soft tissue swelling with moderate hematoma overlying the right parietal vertex. 2. No acute intracranial abnormality. Electronically signed by: Pinkie Pebbles MD MD 04/08/2024 10:22 PM EST RP Workstation: [id number]   DG CHEST PORT 1 VIEW Result Date: 04/05/2024 CLINICAL DATA:  Acute on chronic heart failure. Influenza bronchitis. EXAM: PORTABLE CHEST 1 VIEW COMPARISON:  04/03/2024  FINDINGS: Mildly decreased inspiration. Stable enlarged cardiac silhouette and mildly tortuous thoracic aorta. Mild chronic interstitial prominence. Minimal left lower lobe atelectasis. Diffuse osteopenia. IMPRESSION: 1. Minimal left lower lobe atelectasis. 2. Stable cardiomegaly and mild chronic interstitial disease. Electronically Signed   By: Elspeth Bathe M.D.   On: 04/05/2024 13:45   CT Angio Chest PE W and/or Wo Contrast Result Date: 04/03/2024 EXAM: CTA CHEST 04/03/2024 06:44:08 PM TECHNIQUE: CTA of the chest was performed without and with the administration of 75 mL of iohexol  (OMNIPAQUE ) 350 MG/ML injection. Multiplanar reformatted images are provided for review. MIP images are provided for review. Automated exposure control, iterative reconstruction, and/or weight based adjustment of the mA/kV was utilized to reduce the radiation dose to as low as reasonably achievable. COMPARISON: None available. CLINICAL HISTORY: Pulmonary embolism (PE) suspected, high prob. FINDINGS: PULMONARY ARTERIES: Pulmonary arteries are adequately opacified for evaluation. No acute pulmonary embolus. Main pulmonary artery is normal in caliber. MEDIASTINUM: The heart and pericardium demonstrate no acute abnormality. There are atherosclerotic calcifications of the aorta. LYMPH NODES: Large right hilar lymph node measuring 14 mm. No mediastinal or axillary lymphadenopathy. LUNGS AND PLEURA: There is a trace of bilateral pleural effusions, left greater than right. No pneumothorax. There is atelectasis in the lingula. There is some scarring in both lung apices. There are calcified granulomas in the right upper lobe. The lungs are otherwise clear. UPPER ABDOMEN: Limited images of the upper abdomen are unremarkable. SOFT TISSUES AND BONES: No acute bone or soft tissue abnormality. IMPRESSION: 1. No pulmonary embolism. 2. Large right hilar lymph node measuring 14 mm. 3. Trace bilateral pleural effusions, left greater than right.  Electronically signed by: Greig Pique MD 04/03/2024 07:28 PM EST RP Workstation: [id number]   DG Foot Complete Left Result Date: 04/03/2024  CLINICAL DATA:  Bruising and edema of left foot. EXAM: LEFT FOOT - COMPLETE 3+ VIEW COMPARISON:  None Available. FINDINGS: There is no evidence of acute fracture or dislocation. There is no evidence of arthropathy or other focal bone abnormality. Soft tissues are unremarkable. IMPRESSION: Negative. Electronically Signed   By: Marcey Moan M.D.   On: 04/03/2024 16:45   DG Chest 2 View Result Date: 04/03/2024 CLINICAL DATA:  Lower extremity swelling. EXAM: CHEST - 2 VIEW COMPARISON:  March 24, 2024 FINDINGS: The cardiac silhouette is mildly enlarged and unchanged in size. Mild, chronic appearing increased interstitial lung markings are seen without evidence of focal consolidation or pneumothorax. A small left pleural effusion is suspected. A deformity of indeterminate age is seen along the distal right clavicle. Multilevel degenerative changes are present throughout the thoracic spine. IMPRESSION: 1. Chronic appearing increased interstitial lung markings without evidence of acute or active cardiopulmonary disease. 2. Small left pleural effusion. 3. Deformity of indeterminate age along the distal right clavicle. Correlation with physical examination is recommended to determine the presence of point tenderness. Electronically Signed   By: Suzen Dials M.D.   On: 04/03/2024 13:11   US  Venous Img Upper Uni Left (DVT) Result Date: 03/27/2024 CLINICAL DATA:  Left upper extremity edema EXAM: LEFT UPPER EXTREMITY VENOUS DOPPLER ULTRASOUND TECHNIQUE: Gray-scale sonography with graded compression, as well as color Doppler and duplex ultrasound were performed to evaluate the upper extremity deep venous system from the level of the subclavian vein and including the jugular, axillary, basilic, radial, ulnar and upper cephalic vein. Spectral Doppler was utilized to evaluate  flow at rest and with distal augmentation maneuvers. COMPARISON:  None Available. FINDINGS: Contralateral Subclavian Vein: Respiratory phasicity is normal and symmetric with the symptomatic side. No evidence of thrombus. Normal compressibility. Internal Jugular Vein: No evidence of thrombus. Normal compressibility, respiratory phasicity and response to augmentation. Subclavian Vein: No evidence of thrombus. Normal compressibility, respiratory phasicity and response to augmentation. Axillary Vein: No evidence of thrombus. Normal compressibility, respiratory phasicity and response to augmentation. Cephalic Vein: Left cephalic vein at the antecubital fossa demonstrates ill-defined hypoechoic intraluminal thrombus and is noncompressible compatible with superficial thrombosis/thrombophlebitis. Basilic Vein: No evidence of thrombus. Normal compressibility, respiratory phasicity and response to augmentation. Brachial Veins: No evidence of thrombus. Normal compressibility, respiratory phasicity and response to augmentation. Radial Veins: No evidence of thrombus. Normal compressibility, respiratory phasicity and response to augmentation. Ulnar Veins: No evidence of thrombus. Normal compressibility, respiratory phasicity and response to augmentation. IMPRESSION: 1. Negative for left upper extremity DVT. 2. Positive for left cephalic vein superficial thrombosis/thrombophlebitis at the antecubital fossa. Electronically Signed   By: CHRISTELLA.  Shick M.D.   On: 03/27/2024 10:30   ECHOCARDIOGRAM COMPLETE Result Date: 03/25/2024    ECHOCARDIOGRAM REPORT   Patient Name:   KATILYN MILTENBERGER Date of Exam: 03/25/2024 Medical Rec #:  [id number]           Height:       67.0 in Accession #:    [id number]          Weight:       188.3 lb Date of Birth:  1934-05-10           BSA:          1.971 m Patient Age:    89 years            BP:           126/57 mmHg Patient Gender: F  HR:           102 bpm. Exam Location:  Inpatient  Procedure: 2D Echo, Cardiac Doppler and Color Doppler (Both Spectral and Color            Flow Doppler were utilized during procedure). Indications:    Atrial fibrillation  History:        Patient has no prior history of Echocardiogram examinations.                 Arrythmias:Atrial Fibrillation; Risk Factors:Hypertension.  Sonographer:    Philomena Daring Referring Phys: [email or web address] Muhannad Bignell IMPRESSIONS  1. Left ventricular ejection fraction, by estimation, is 65 to 70%. The left ventricle has normal function. The left ventricle has no regional wall motion abnormalities. There is mild concentric left ventricular hypertrophy. Left ventricular diastolic parameters are indeterminate.  2. Right ventricular systolic function is normal. The right ventricular size is normal.  3. The mitral valve is normal in structure. No evidence of mitral valve regurgitation. No evidence of mitral stenosis.  4. The aortic valve was not well visualized. There is mild calcification of the aortic valve. Aortic valve regurgitation is mild. Aortic valve sclerosis is present, with no evidence of aortic valve stenosis.  5. The inferior vena cava is normal in size with greater than 50% respiratory variability, suggesting right atrial pressure of 3 mmHg. Comparison(s): No prior Echocardiogram. FINDINGS  Left Ventricle: Left ventricular ejection fraction, by estimation, is 65 to 70%. The left ventricle has normal function. The left ventricle has no regional wall motion abnormalities. The left ventricular internal cavity size was normal in size. There is  mild concentric left ventricular hypertrophy. Left ventricular diastolic parameters are indeterminate. Right Ventricle: The right ventricular size is normal. No increase in right ventricular wall thickness. Right ventricular systolic function is normal. Left Atrium: Left atrial size was normal in size. Right Atrium: Right atrial size was normal in size. Pericardium: There is no evidence of pericardial  effusion. Mitral Valve: The mitral valve is normal in structure. No evidence of mitral valve regurgitation. No evidence of mitral valve stenosis. Tricuspid Valve: The tricuspid valve is normal in structure. Tricuspid valve regurgitation is mild . No evidence of tricuspid stenosis. Aortic Valve: The aortic valve was not well visualized. There is mild calcification of the aortic valve. Aortic valve regurgitation is mild. Aortic regurgitation PHT measures 465 msec. Aortic valve sclerosis is present, with no evidence of aortic valve stenosis. Pulmonic Valve: The pulmonic valve was normal in structure. Pulmonic valve regurgitation is mild. No evidence of pulmonic stenosis. Aorta: The aortic root and ascending aorta are structurally normal, with no evidence of dilitation. Venous: The inferior vena cava is normal in size with greater than 50% respiratory variability, suggesting right atrial pressure of 3 mmHg. IAS/Shunts: No atrial level shunt detected by color flow Doppler.  LEFT VENTRICLE PLAX 2D LVIDd:         3.90 cm   Diastology LVIDs:         2.60 cm   LV e' medial:    10.23 cm/s LV PW:         1.10 cm   LV E/e' medial:  9.5 LV IVS:        1.10 cm   LV e' lateral:   14.80 cm/s LVOT diam:     1.80 cm   LV E/e' lateral: 6.5 LV SV:         42 LV SV Index:   22 LVOT Area:  2.54 cm  RIGHT VENTRICLE             IVC RV Basal diam:  2.90 cm     IVC diam: 1.40 cm RV Mid diam:    2.20 cm RV S prime:     14.00 cm/s TAPSE (M-mode): 2.2 cm LEFT ATRIUM             Index        RIGHT ATRIUM           Index LA diam:        3.30 cm 1.67 cm/m   RA Area:     15.70 cm LA Vol (A2C):   46.8 ml 23.74 ml/m  RA Volume:   37.00 ml  18.77 ml/m LA Vol (A4C):   42.7 ml 21.66 ml/m LA Biplane Vol: 48.7 ml 24.71 ml/m  AORTIC VALVE LVOT Vmax:   129.33 cm/s LVOT Vmean:  83.367 cm/s LVOT VTI:    0.167 m AI PHT:      465 msec  AORTA Ao Root diam: 3.20 cm Ao Asc diam:  3.20 cm MITRAL VALVE               TRICUSPID VALVE MV Area (PHT): 4.67 cm     TR Peak grad:   30.9 mmHg MV Decel Time: 163 msec    TR Vmax:        278.00 cm/s MV E velocity: 96.90 cm/s                            SHUNTS                            Systemic VTI:  0.17 m                            Systemic Diam: 1.80 cm Stanly Leavens MD Electronically signed by Stanly Leavens MD Signature Date/Time: 03/25/2024/12:13:57 PM    Final    DG Chest 1 View Result Date: 03/24/2024 CLINICAL DATA:  Weakness and fall. EXAM: CHEST  1 VIEW COMPARISON:  None available. FINDINGS: The heart is mildly enlarged. No pulmonary vascular congestion. Mediastinal silhouette within normal limits. Lungs are clear. Deformity of the lateral RIGHT clavicle suspicious for fracture. IMPRESSION: 1. Mild cardiomegaly. 2. Deformity of the lateral RIGHT clavicle suspicious for fracture. Please correlate for focal tenderness. Electronically Signed   By: Aliene Lloyd M.D.   On: 03/24/2024 14:04   DG Knee Complete 4 Views Right Result Date: 03/24/2024 EXAM: 4 VIEW(S) XRAY OF THE KNEE 03/24/2024 11:07:00 AM COMPARISON: None available. CLINICAL HISTORY: fall FINDINGS: BONES AND JOINTS: No acute fracture. No malalignment. No significant joint effusion. Mild lateral compartment joint space narrowing. Marginal osteophytosis and subchondral sclerosis of the lateral compartment. Additional subtle marginal osteophytosis of the medial compartment. SOFT TISSUES: Mild diffuse soft tissue swelling. IMPRESSION: 1. No acute fracture or dislocation. 2. Mild diffuse soft tissue swelling. 3. Lateral compartment predominant osteoarthritis. Electronically signed by: Donnice Mania MD 03/24/2024 11:27 AM EST RP Workstation: [id number]   CT Cervical Spine Wo Contrast Result Date: 03/24/2024 EXAM: CT CERVICAL SPINE WITHOUT CONTRAST 03/24/2024 10:59:54 AM TECHNIQUE: CT of the cervical spine was performed without the administration of intravenous contrast. Multiplanar reformatted images are provided for review. Automated exposure  control, iterative reconstruction, and/or weight based adjustment of the mA/kV was utilized to reduce the radiation  dose to as low as reasonably achievable. COMPARISON: None available. CLINICAL HISTORY: Neck trauma (Age >= 65y) FINDINGS: BONES AND ALIGNMENT: Straightening of the normal cervical lordosis. Trace degenerative anterolisthesis of C2 on C3 and C3 on C4. Additional trace degenerative anterolisthesis of C7 on T1. No acute fracture or traumatic malalignment. DEGENERATIVE CHANGES: Moderate disc space narrowing at C4-C5. Additional disc space narrowing at C5-C6 and C6-C7. Mild degenerative endplate osteophytes at multiple levels. There are disc osteophyte complexes at multiple levels without evidence of high grade osseous spinal canal stenosis. Facet arthrosis and uncovertebral hypertrophy at multiple levels. SOFT TISSUES: No prevertebral soft tissue swelling. LUNGS: Scarring and atelectasis in the right lung apex. IMPRESSION: 1. No evidence of acute traumatic injury. 2. Degenerative changes as above. Electronically signed by: Donnice Mania MD 03/24/2024 11:25 AM EST RP Workstation: [id number]   CT Head Wo Contrast Result Date: 03/24/2024 EXAM: CT HEAD WITHOUT CONTRAST 03/24/2024 10:59:54 AM TECHNIQUE: CT of the head was performed without the administration of intravenous contrast. Automated exposure control, iterative reconstruction, and/or weight based adjustment of the mA/kV was utilized to reduce the radiation dose to as low as reasonably achievable. COMPARISON: None available. CLINICAL HISTORY: Head trauma, minor (Age >= 65y). FINDINGS: BRAIN AND VENTRICLES: No acute hemorrhage. 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 Mild chronic microvascular ischemic change. No evidence of acute infarct. No hydrocephalus. No extra-axial collection. No mass effect or midline shift. ORBITS: Bilateral lens replacement noted. SINUSES:  Trace right mastoid effusion. SOFT TISSUES AND SKULL: Right periorbital soft tissue swelling. No skull fracture. IMPRESSION: 1. No acute intracranial abnormality. 2. Right periorbital soft tissue swelling. 3. Focal encephalomalacia along the anterolateral left temporal lobe, suggestive of small remote infarct or sequelae of prior trauma. Electronically signed by: Donnice Mania MD 03/24/2024 11:18 AM EST RP Workstation: [id number]    Alm Schneider, DO  [facility]  If 7PM-7AM, please contact night-coverage [email or web address] Password [facility] 04/10/2024, 9:17 AM   LOS: 7 days   "

## 2024-04-10 NOTE — Progress Notes (Signed)
 [facility] NEPHROLOGY PROGRESS NOTE  Assessment/ Plan: Pt is a 89 y.o. yo female with past medical history significant for hypertension, dyslipidemia, prediabetes, A-fib, CHF with preserved EF was recently hospitalized from 12/27 - 03/31/2024 for hyponatremia, CHF and A-fib with RVR presents now with worsening lower extremity edema and coughing. Our team saw the patient for hyponatremia which was treated with diuretics and a dose of tolvaptan  with improvement of sodium to 130.  Now we are reconsulted for worsening hyponatremia.  # Acute on chronic hyponatremia: Longstanding issue and historical problem.  Does have chronic lower extremity edema.  Hard to tell if this is lymphedema.  Does probably have some predisposition for volume overload related to heart failure with preserved ejection fraction.  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  Has responded to tolvaptan  in the past and got earlier this hospitalization.  Likely worse at this time related to viral illness - She was recently switched to Torsemide  - which we have held with concern for depletion.  Resume torsemide  tomorrow  at 40 mg daily dose  - no tolvaptan  today  - She would benefit from outpatient nephrology follow-up on discharge   # Hypokalemia: improved   # Acute on chronic CHF with preserved EF: diuretics as above - I have asked that her weight be repeated for accuracy  # Influenza/acute bronchitis: Supportive treatment per primary team. - see PRN is ordered for cough - RN to give  # Atrial fibrillation with RVR - per primary team   # HTN/volume: BP acceptable. Volume as above  Disposition - continue inpatient monitoring     Subjective:  Patient with moved to the ICU this AM for afib with RVR per charting.  She had 1.1 liters UOP over 1/12 charted. Weight jumped up to 64 kg this AM - felt not accurate.  She states not on torsemide  at home - listed as an unreviewed home med.  Assess again  tomorrow   Review of systems:    Denies shortness of breath or chest pain; reports a cough  Denies n/v; not much appetite     Objective Vital signs in last 24 hours: Vitals:   04/10/24 0729 04/10/24 0835 04/10/24 0938 04/10/24 1000  BP:  118/62 (!) 129/56 (!) 96/58  Pulse:   (!) 138 (!) 123  Resp:   20 17  Temp:      TempSrc:      SpO2: 96%  97% 96%  Weight:      Height:       Weight change: 5.5 kg  Intake/Output Summary (Last 24 hours) at 04/10/2024 1018 Last data filed at 04/10/2024 0301 Gross per 24 hour  Intake --  Output 1100 ml  Net -1100 ml       Labs: RENAL PANEL Recent Labs  Lab 04/04/24 0409 04/05/24 0429 04/05/24 0905 04/06/24 0542 04/06/24 0818 04/06/24 1502 04/07/24 0945 04/07/24 1624 04/08/24 0413 04/09/24 0118 04/10/24 0547  NA 127* 119*   < > 123*  --    < > 131* 130* 131* 129* 130*  K 3.2* 3.3*   < > 4.7  --    < > 3.9 3.9 4.0 3.8 3.8  CL 86* 80*   < > 87*  --    < > 90* 90* 91* 88* 91*  CO2 33* 31   < > 22  --    < > 33* 26 28 25  32  GLUCOSE 95 116*   < > 81  --    < >  137* 102* 90 125* 92  BUN 16 15   < > 18  --    < > 14 19 16 23 17   CREATININE 0.75 0.79   < > 0.72  --    < > 0.85 0.89 0.87 1.18* 0.81  CALCIUM 8.1* 8.4*   < > 8.6*  --    < > 9.1 8.9 9.1 9.4 8.9  MG 2.0 2.1  --  2.0  --   --   --   --  2.0 2.0 1.9  PHOS 3.9  --   --   --   --   --   --   --   --   --   --   ALBUMIN  --   --   --   --  3.5  --   --   --   --   --   --    < > = values in this interval not displayed.    Liver Function Tests: Recent Labs  Lab 04/06/24 0818  AST 31  ALT 23  ALKPHOS 86  BILITOT 0.2  PROT 6.9  ALBUMIN 3.5   No results for input(s): LIPASE, AMYLASE in the last 168 hours. No results for input(s): AMMONIA in the last 168 hours. CBC: Recent Labs    03/24/24 1118 03/24/24 1723 03/25/24 0552 03/25/24 1118 04/03/24 1232 04/04/24 0409 04/06/24 0542 04/07/24 0103 04/09/24 0118  HGB  --   --    < >  --  10.0* 9.7* 10.4*  11.1* 12.3  MCV  --   --    < >  --  90.1 88.3 89.6 90.2 88.4  VITAMINB12  --  1,127*  --  1,182*  --   --   --   --   --   FOLATE 7.6 11.6  --   --   --   --   --   --   --   FERRITIN  --   --   --  301  --   --   --   --   --   TIBC  --   --   --  248*  --   --   --   --   --   IRON  --   --   --  16*  --   --   --   --   --    < > = values in this interval not displayed.    Cardiac Enzymes: No results for input(s): CKTOTAL, CKMB, CKMBINDEX, TROPONINI in the last 168 hours. CBG: Recent Labs  Lab 04/09/24 0109  GLUCAP 117*    Iron Studies: No results for input(s): IRON, TIBC, TRANSFERRIN, FERRITIN in the last 72 hours. Studies/Results: CT HEAD WO CONTRAST ( ) Result Date: 04/08/2024 EXAM: CT HEAD WITHOUT CONTRAST 04/08/2024 10:19:48 PM TECHNIQUE: CT of the head was performed without the administration of intravenous contrast. Automated exposure control, iterative reconstruction, and/or weight based adjustment of the mA/kV was utilized to reduce the radiation dose to as low as reasonably achievable. COMPARISON: None available. CLINICAL HISTORY: fall, witnessed, on Blood thinner FINDINGS: BRAIN AND VENTRICLES: Subcortical and periventricular small vessel ischemic changes. No acute hemorrhage. No evidence of acute infarct. No hydrocephalus. No extra-axial collection. No mass effect or midline shift. ORBITS: No acute abnormality. SINUSES: No acute abnormality. SOFT TISSUES AND SKULL: Soft tissue swelling with moderate hematoma overlying the right parietal vertex (image 67). No skull fracture. IMPRESSION: 1. Soft  tissue swelling with moderate hematoma overlying the right parietal vertex. 2. No acute intracranial abnormality. Electronically signed by: Pinkie Pebbles MD MD 04/08/2024 10:22 PM EST RP Workstation: [id number]    Medications: Infusions:    Scheduled Medications:  Chlorhexidine  Gluconate Cloth  6 each Topical Daily   diltiazem   180 mg Oral Daily    have  reviewed scheduled and prn medications.  Physical Exam:      General elderly female in bed in no acute distress HEENT normocephalic atraumatic extraocular movements intact sclera anicteric Neck supple trachea midline Lungs clear to auscultation bilaterally normal work of breathing at rest; with occ wet cough; on room air  Heart S1S2 no rub Abdomen soft nontender nondistended Extremities no edema appreciated; no cyanosis or clubbing  Psych normal mood and affect Neuro alert and conversant; provides hx and follows commands  Glenda Nguyen Glenda Nguyen 04/10/2024,10:36 AM  LOS: 7 days

## 2024-04-10 NOTE — Plan of Care (Signed)
  Problem: Education: Goal: Knowledge of General Education information will improve Description: Including pain rating scale, medication(s)/side effects and non-pharmacologic comfort measures Outcome: Progressing   Problem: Health Behavior/Discharge Planning: Goal: Ability to manage health-related needs will improve Outcome: Progressing   Problem: Clinical Measurements: Goal: Ability to maintain clinical measurements within normal limits will improve Outcome: Progressing Goal: Will remain free from infection Outcome: Progressing Goal: Diagnostic test results will improve Outcome: Progressing Goal: Respiratory complications will improve Outcome: Progressing Goal: Cardiovascular complication will be avoided Outcome: Not Progressing   Problem: Activity: Goal: Risk for activity intolerance will decrease Outcome: Not Progressing   Problem: Nutrition: Goal: Adequate nutrition will be maintained Outcome: Progressing   Problem: Coping: Goal: Level of anxiety will decrease Outcome: Progressing   Problem: Elimination: Goal: Will not experience complications related to bowel motility Outcome: Progressing Goal: Will not experience complications related to urinary retention Outcome: Progressing   Problem: Pain Managment: Goal: General experience of comfort will improve and/or be controlled Outcome: Progressing   Problem: Safety: Goal: Ability to remain free from injury will improve Outcome: Progressing   Problem: Skin Integrity: Goal: Risk for impaired skin integrity will decrease Outcome: Progressing

## 2024-04-10 NOTE — Progress Notes (Signed)
 Physical Therapy Treatment Patient Details Name: Glenda Nguyen MRN: [id number] DOB: 12/08/1934 Today's Date: 04/10/2024   History of Present Illness Glenda Nguyen is a 89 y.o. female with medical history significant for hypertension, hyperlipidemia, recently admitted on 03/24/2024 and discharged home on 03/31/2024 with new diagnoses of atrial fibrillation and hyponatremia, who presents to the ER with complaints of legs swelling and >2 weeks cough.  Denies any recent injury.  Has had difficulty ambulating due to the pain associated with the swelling of her feet and legs.     In the ER, tachycardic and tachypneic.  Chest x-ray was nonacute.  proBNP 6228.  CTA was negative for pulmonary embolism however showed a large right hilar lymph node measuring 14 mm.  Trace bilateral pleural effusions, left greater than right.  Volume overload on exam.       Has a dorsal left foot scar that has been present for months and is non tender without significant erythema, doubt cellulitis.  The patient received IV Lasix  in the ER.  Due to concerns for left dorsal foot cellulitis the patient also received IV clindamycin  by EDP.     Admitted by [facility], hospitalist service, for further management of acute on chronic HFpEF and questionable left foot cellulitis.    PT Comments  Patient presented lying in bed and agreeable to participation with therapy today.  Pt denies any symptoms or shortness of breath and remained that way throughout session today.  Pt does complete mobility slow but cautious, min assist for all for safety.  PT stood with cues for UE placement and ambulated a few more feet today (limited by equipment and tendency for syncopal episodes).  Pt did stand for nearly 2 minutes and completed marching, heelraises to help improve LE strength and mobility.  Pt ambulated to bedside chair, nursing notified. Patient will benefit from continued skilled physical therapy in hospital and recommended venue below to increase  strength, balance, endurance for safe ADLs and gait.        If plan is discharge home, recommend the following: A lot of help with bathing/dressing/bathroom;A lot of help with walking and/or transfers;Assistance with cooking/housework;Assist for transportation;Help with stairs or ramp for entrance   Can travel by private vehicle     Yes  Equipment Recommendations  Rolling walker (2 wheels)       Precautions / Restrictions Precautions Precautions: Fall Recall of Precautions/Restrictions: Intact Restrictions Weight Bearing Restrictions Per Provider Order: No     Mobility  Bed Mobility Overal bed mobility: Needs Assistance Bed Mobility: Supine to Sit, Sit to Supine     Supine to sit: Contact guard, Min assist Sit to supine: Contact guard assist, Min assist   General bed mobility comments: increased time, labored movement    Transfers Overall transfer level: Needs assistance Equipment used: Rolling walker (2 wheels) Transfers: Sit to/from Stand, Bed to chair/wheelchair/BSC Sit to Stand: Min assist           General transfer comment: unsteady labored movement    Ambulation/Gait Ambulation/Gait assistance: Mod assist Gait Distance (Feet): 5 Feet Assistive device: Rolling walker (2 wheels) Gait Pattern/deviations: Decreased step length - left, Decreased stance time - right, Decreased stride length Gait velocity: decreased     General Gait Details: much improvement, no dizziness, feeling stronger.  Stood in walker for 2 minutes before sitting down         Communication Communication Communication: No apparent difficulties  Cognition Arousal: Alert Behavior During Therapy: [facility] for tasks assessed/performed  Following commands: Intact      Cueing Cueing Techniques: Verbal cues, Tactile cues, Visual cues  Exercises General Exercises - Lower Extremity Hip Flexion/Marching: AROM, 10 reps, Standing Mini-Sqauts: AROM, 10  reps, Standing    General Comments        Pertinent Vitals/Pain Pain Assessment Pain Assessment: No/denies pain        PT Goals (current goals can now be found in the care plan section) Acute Rehab PT Goals Patient Stated Goal: return home PT Goal Formulation: With patient Time For Goal Achievement: 04/23/24 Potential to Achieve Goals: Good    Frequency    Min 3X/week       AM-PAC PT 6 Clicks Mobility   Outcome Measure  Help needed turning from your back to your side while in a flat bed without using bedrails?: A Little Help needed moving from lying on your back to sitting on the side of a flat bed without using bedrails?: A Little Help needed moving to and from a bed to a chair (including a wheelchair)?: A Little Help needed standing up from a chair using your arms (e.g., wheelchair or bedside chair)?: A Little Help needed to walk in hospital room?: A Little Help needed climbing 3-5 steps with a railing? : A Lot 6 Click Score: 17    End of Session Equipment Utilized During Treatment: Gait belt Activity Tolerance: Patient tolerated treatment well;Patient limited by fatigue Patient left: with call bell/phone within reach;in chair Nurse Communication: Mobility status PT Visit Diagnosis: Unsteadiness on feet (R26.81);Other abnormalities of gait and mobility (R26.89);History of falling (Z91.81);Muscle weakness (generalized) (M62.81)     Time: 1350-1415 PT Time Calculation (min) (ACUTE ONLY): 25 min  Charges:    $Gait Training: 8-22 mins $Therapeutic Activity: 8-22 mins PT General Charges $$ ACUTE PT VISIT: 1 Visit                    Greig KATHEE Fuse, PTA/CLT [facility] Ph: [phone]    Fuse Greig KATHEE 04/10/2024, 3:01 PM

## 2024-04-11 DIAGNOSIS — I5033 Acute on chronic diastolic (congestive) heart failure: Secondary | ICD-10-CM | POA: Diagnosis not present

## 2024-04-11 DIAGNOSIS — I4891 Unspecified atrial fibrillation: Secondary | ICD-10-CM | POA: Diagnosis not present

## 2024-04-11 LAB — BASIC METABOLIC PANEL WITH GFR
Anion gap: 10 (ref 5–15)
BUN: 15 mg/dL (ref 8–23)
CO2: 28 mmol/L (ref 22–32)
Calcium: 8.8 mg/dL — ABNORMAL LOW (ref 8.9–10.3)
Chloride: 92 mmol/L — ABNORMAL LOW (ref 98–111)
Creatinine, Ser: 0.8 mg/dL (ref 0.44–1.00)
GFR, Estimated: >60 mL/min
Glucose, Bld: 88 mg/dL (ref 70–99)
Potassium: 3.7 mmol/L (ref 3.5–5.1)
Sodium: 131 mmol/L — ABNORMAL LOW (ref 135–145)

## 2024-04-11 LAB — MAGNESIUM: Magnesium: 2.2 mg/dL (ref 1.7–2.4)

## 2024-04-11 MED ORDER — METOPROLOL TARTRATE 25 MG PO TABS
25.0000 mg | ORAL_TABLET | Freq: Two times a day (BID) | ORAL | Status: DC
  Administered 2024-04-11 – 2024-04-14 (×6): 25 mg via ORAL
  Filled 2024-04-11 (×6): qty 1

## 2024-04-11 MED ORDER — SODIUM CHLORIDE 0.9 % IV BOLUS
250.0000 mL | Freq: Once | INTRAVENOUS | Status: AC
  Administered 2024-04-11: 250 mL via INTRAVENOUS

## 2024-04-11 MED ORDER — ACETAMINOPHEN 500 MG PO TABS
1000.0000 mg | ORAL_TABLET | Freq: Four times a day (QID) | ORAL | Status: DC | PRN
  Administered 2024-04-11 – 2024-04-13 (×3): 1000 mg via ORAL
  Filled 2024-04-11 (×3): qty 2

## 2024-04-11 NOTE — Progress Notes (Addendum)
 At 1130: Worked with PT and is up in the chair eating lunch. Used incentive spirometer and got up to 750. Given prn tessalon  and robitussin dm for congested cough  At 1319: Made Dr Maree aware BP 75/49 after metoprolol  po given at 1230 and BP 126/72 prior to this med being given. Patient states is dizzy and nauseas as result. HR 70s to 80s afib.  Dr. Maree ordered NS 250 ml bolus IV and BP responded to this BP became 105/59 map 73 HR 65 at 1415

## 2024-04-11 NOTE — Evaluation (Signed)
 Occupational Therapy Evaluation Patient Details Name: Glenda Nguyen MRN: [id number] DOB: 1934/04/13 Today's Date: 04/11/2024   History of Present Illness   Glenda Nguyen is a 89 y.o. female with medical history significant for hypertension, hyperlipidemia, recently admitted on 03/24/2024 and discharged home on 03/31/2024 with new diagnoses of atrial fibrillation and hyponatremia, who presents to the ER with complaints of legs swelling and >2 weeks cough.  Denies any recent injury.  Has had difficulty ambulating due to the pain associated with the swelling of her feet and legs.     In the ER, tachycardic and tachypneic.  Chest x-ray was nonacute.  proBNP 6228.  CTA was negative for pulmonary embolism however showed a large right hilar lymph node measuring 14 mm.  Trace bilateral pleural effusions, left greater than right.  Volume overload on exam.       Has a dorsal left foot scar that has been present for months and is non tender without significant erythema, doubt cellulitis.  The patient received IV Lasix  in the ER.  Due to concerns for left dorsal foot cellulitis the patient also received IV clindamycin  by EDP.     Admitted by [facility], hospitalist service, for further management of acute on chronic HFpEF and questionable left foot cellulitis.     Clinical Impressions Pt agreeable to OT and PT co-evaluation/treatment. Pt is independent at baseline with use of walking stick, but today pt required min A without use of RW for step pivot transfer. Pt demonstrates level of CGA to set up assist for seated ADL's, but is unsteady in standing and needing use of RW today. Pt is also generally weak in B UE. HR noted to increase to ~130 BPM during the session. See below for more information on O2 status. Pt left in the chair with call bell within reach. Pt will benefit from continued OT in the hospital to increase strength, balance, and endurance for safe ADL's.        If plan is discharge home,  recommend the following:   A little help with walking and/or transfers;A little help with bathing/dressing/bathroom;Assistance with cooking/housework;Assist for transportation;Help with stairs or ramp for entrance     Functional Status Assessment   Patient has had a recent decline in their functional status and demonstrates the ability to make significant improvements in function in a reasonable and predictable amount of time.     Equipment Recommendations   None recommended by OT             Precautions/Restrictions   Precautions Precautions: Fall Recall of Precautions/Restrictions: Intact Restrictions Weight Bearing Restrictions Per Provider Order: No     Mobility Bed Mobility Overal bed mobility: Needs Assistance Bed Mobility: Supine to Sit     Supine to sit: Contact guard, Supervision     General bed mobility comments: Mild labored effort.    Transfers Overall transfer level: Needs assistance   Transfers: Sit to/from Stand, Bed to chair/wheelchair/BSC Sit to Stand: Min assist, Contact guard assist     Step pivot transfers: Min assist     General transfer comment: EOB to chair without AD with near loss of balance needing assist to steady.      Balance Overall balance assessment: Needs assistance Sitting-balance support: Feet supported, No upper extremity supported Sitting balance-Leahy Scale: Good Sitting balance - Comments: seated at EOB   Standing balance support: No upper extremity supported Standing balance-Leahy Scale: Poor Standing balance comment: poor without RW; fair with RW.  ADL either performed or assessed with clinical judgement   ADL Overall ADL's : Needs assistance/impaired     Grooming: Set up;Sitting       Lower Body Bathing: Set up;Sitting/lateral leans       Lower Body Dressing: Set up;Sitting/lateral leans   Toilet Transfer: Minimal assistance;Stand-pivot Toilet Transfer  Details (indicate cue type and reason): EOB to chair without AD Toileting- Clothing Manipulation and Hygiene: Set up;Contact guard assist;Sitting/lateral lean;Sit to/from stand Toileting - Clothing Manipulation Details (indicate cue type and reason): Able to complete peri-care seated at the toilet.     Functional mobility during ADLs: Minimal assistance;Rolling walker (2 wheels);Contact guard assist General ADL Comments: Able to ambualte a short dsitance with RW.     Vision Baseline Vision/History: 1 Wears glasses Ability to See in Adequate Light: 1 Impaired Patient Visual Report: No change from baseline Vision Assessment?: No apparent visual deficits;Wears glasses for reading     Perception Perception: Not tested       Praxis Praxis: Not tested       Pertinent Vitals/Pain Pain Assessment Pain Assessment: No/denies pain     Extremity/Trunk Assessment Upper Extremity Assessment Upper Extremity Assessment: Generalized weakness (Mild A/ROM deficit for shoulder flexion bilaterally.)   Lower Extremity Assessment Lower Extremity Assessment: Defer to PT evaluation   Cervical / Trunk Assessment Cervical / Trunk Assessment: Kyphotic   Communication Communication Communication: No apparent difficulties   Cognition Arousal: Alert Behavior During Therapy: WFL for tasks assessed/performed Cognition: No apparent impairments                               Following commands: Intact       Cueing  General Comments   Cueing Techniques: Verbal cues;Tactile cues;Visual cues  HR increased to high of 130 BPM at one point. Saturation also seemed to dip to 72% at one point but went back up >90% evenetually.              Home Living Family/patient expects to be discharged to:: Private residence Living Arrangements: Children;Other relatives Available Help at Discharge: Family;Available 24 hours/day;Friend(s) Type of Home: House Home Access: Stairs to enter [employer] of Steps: 3 Entrance Stairs-Rails: Right;Left;Can reach both Home Layout: One level     Bathroom Shower/Tub: Chief Strategy Officer: Standard Bathroom Accessibility: Yes How Accessible: Accessible via wheelchair;Accessible via walker            Prior Functioning/Environment Prior Level of Function : Independent/Modified Independent;Driving;History of Falls (last six months)             Mobility Comments: information from recent admission, Community ambulator without AD; drives; reported using walking stick as needed to help reach things and boost from a low surfaces such as toilet reports no falls or instances since being discharged last ADLs Comments: Independent.    OT Problem List: Decreased strength;Decreased range of motion;Decreased activity tolerance;Impaired balance (sitting and/or standing);Pain   OT Treatment/Interventions: Self-care/ADL training;Therapeutic exercise;DME and/or AE instruction;Therapeutic activities;Patient/family education;Balance training      OT Goals(Current goals can be found in the care plan section)   Acute Rehab OT Goals Patient Stated Goal: Improve function. OT Goal Formulation: With patient Time For Goal Achievement: 04/25/24 Potential to Achieve Goals: Good   OT Frequency:  Min 2X/week    Co-evaluation PT/OT/SLP Co-Evaluation/Treatment: Yes Reason for Co-Treatment: To address functional/ADL transfers   OT goals addressed during session: ADL's and self-care  End of Session Equipment Utilized During Treatment: Rolling walker (2 wheels)  Activity Tolerance: Patient tolerated treatment well Patient left: in chair;with call bell/phone within reach  OT Visit Diagnosis: Unsteadiness on feet (R26.81);Other abnormalities of gait and mobility (R26.89);Muscle weakness (generalized) (M62.81);History of falling (Z91.81)                Time: 8890-8872 OT Time Calculation (min):  18 min Charges:  OT General Charges $OT Visit: 1 Visit OT Evaluation $OT Eval Low Complexity: 1 Low  Glenda Nguyen OT, MOT  Jayson Person 04/11/2024, 12:53 PM

## 2024-04-11 NOTE — Progress Notes (Signed)
 " PROGRESS NOTE    Glenda Nguyen  FMW:[id number] DOB: 24-Jun-1934 DOA: 04/03/2024 PCP: Vick Lurie, FNP   Brief Narrative:   89 year old female with a history of HFpEF, paroxysmal atrial fibrillation, hypertension, hyperlipidemia, prediabetes presenting with worsening lower extremity edema and coughing.  The patient was recently admitted to the hospital from 03/24/2024 to 03/31/2024 for hyponatremia, acute HFpEF, and new onset atrial fibrillation with RVR.  She was discharged home with torsemide  30 mg daily, Cardizem  CD 120 milligrams daily, and metoprolol  12.5 mg twice daily. The patient returns because of worsening lower extremity edema and cough.  She has had some difficulty ambulating secondary to pain associated with swelling of her legs and feet.  She was admitted for acute on chronic HFpEF and persistent atrial fibrillation with RVR and was started on diuresis as well as IV amiodarone  drip that is now being transition to oral dosing per cardiology.  She is also noted to have hyponatremia due to volume overload as well as SIADH and is being followed by nephrology.   Assessment & Plan:   Principal Problem:   Acute on chronic diastolic heart failure (HCC) Active Problems:   Hyponatremia   Atrial fibrillation with RVR (HCC)   Hypokalemia   Bronchitis with influenza   Novel influenza A respiratory infection  Assessment and Plan:  Acute on chronic HFpEF - 04/03/2024 CTA chest--negative PE, trace bilateral pleural effusions, left greater than right, biapical scarring,RUL calcified granuloma - 03/25/2024 echo EF 65 to 70%, no WMA, normal RVF - Continue lasix  to 40 mg IV bid>>increase to 80 mg BID - Continue on torsemide  40 mg daily per cardiology   Persistent atrial fibrillation with RVR - apixaban >>hold temporarily as pt fell has scalp hematoma - 04/04/24--developed RVR>>started IV diltiazem  - 04/05/24--transition back to po diltiazem  - Echo within normal limits -  04/08/24--transitioned to Cardizem  CD 180 mg - 04/10/24 went back to RVR now weaning off of IV amiodarone  to oral dosing by this evening   Orthostasis/vasovagal syncope -pt had syncopal episode after standing 1/11 eveing - near syncope with PT 1/12 - 04/09/24 holding torsemide  for now   Hyponatremia-improving -Sodium is up to 131 on 1/14 - due to volume overload and SIAHD -required tolvaptan  last hospitalization -nephrology consult appreciated>>redose tovalptan 1/9 -check serum Osm--266 -urine Osm 581 -urine Na 69 - Na stable 129-131 last 24-48 hours   Influenza Bronchitis -1/9 personally reviewed CXR--increased interstitial markings -PCT 0.14 - started oseltamivir  1/8--finished 5 days   Elevated troponin - Secondary to demand ischemia - No chest pain presently - Personally reviewed EKG--atrial fibrillation with nonspecific T changes   Large right hilar lymph node measuring 14 mm -Possibly reactive, will need to be monitored - Outpatient surveillance and possible PET scan   Generalized weakness PT OT evaluation>>SNF; needs to have P2P Fall precautions.    DVT prophylaxis:SCDs, apixaban  held Code Status: Full Family Communication: None at bedside Disposition Plan:  Status is: Inpatient Remains inpatient appropriate because: Need for IV medications.   Consultants:  Cardiology Nephrology  Procedures:  None  Antimicrobials:  Anti-infectives (From admission, onward)    Start     Dose/Rate Route Frequency Ordered Stop   04/05/24 1515  oseltamivir  (TAMIFLU ) capsule 30 mg        30 mg Oral 2 times daily 04/05/24 1420 04/09/24 2107   04/05/24 1000  azithromycin  (ZITHROMAX ) tablet 250 mg  Status:  Discontinued       Placed in Followed by Linked Group   250 mg Oral  Daily 04/04/24 0620 04/05/24 0732   04/04/24 0715  azithromycin  (ZITHROMAX ) tablet 500 mg       Placed in Followed by Linked Group   500 mg Oral  Once 04/04/24 0620 04/04/24 0629   04/04/24 0618   cefTRIAXone  (ROCEPHIN ) 1 g in sodium chloride  0.9 % 100 mL IVPB  Status:  Discontinued        1 g 200 mL/hr over 30 Minutes Intravenous Every 24 hours 04/04/24 0618 04/06/24 1715   04/04/24 0618  azithromycin  (ZITHROMAX ) 500 mg in sodium chloride  0.9 % 250 mL IVPB  Status:  Discontinued        500 mg 250 mL/hr over 60 Minutes Intravenous Every 24 hours 04/04/24 0618 04/04/24 0620   04/03/24 1615  clindamycin  (CLEOCIN ) IVPB 600 mg        600 mg 100 mL/hr over 30 Minutes Intravenous  Once 04/03/24 1600 04/03/24 1703      Subjective: Patient seen and evaluated today with no new acute complaints or concerns. No acute concerns or events noted overnight.  Heart rates are improved on IV amiodarone  drip.  Objective: Vitals:   04/11/24 1030 04/11/24 1100 04/11/24 1130 04/11/24 1159  BP: 111/65 (!) 116/58 126/72   Pulse: 86 86 78   Resp: 19 19 20    Temp:    97.8 F (36.6 C)  TempSrc:    Oral  SpO2: 99% 98% (!) 83%   Weight:      Height:        Intake/Output Summary (Last 24 hours) at 04/11/2024 1246 Last data filed at 04/11/2024 1138 Gross per 24 hour  Intake 133.42 ml  Output 2075 ml  Net -1941.58 ml   Filed Weights   04/10/24 0300 04/10/24 1000 04/11/24 0500  Weight: 64 kg 61.9 kg 62.3 kg    Examination:  General exam: Appears calm and comfortable  Respiratory system: Clear to auscultation. Respiratory effort normal. Cardiovascular system: S1 & S2 heard, irregular and tachycardic. Gastrointestinal system: Abdomen is soft Central nervous system: Alert and awake Extremities: No edema Skin: No significant lesions noted Psychiatry: Flat affect.    Data Reviewed: I have personally reviewed following labs and imaging studies  CBC: Recent Labs  Lab 04/06/24 0542 04/07/24 0103 04/09/24 0118  WBC 3.8* 4.2 11.2*  HGB 10.4* 11.1* 12.3  HCT 30.3* 33.3* 35.9*  MCV 89.6 90.2 88.4  PLT 359 375 449*   Basic Metabolic Panel: Recent Labs  Lab 04/06/24 0542 04/06/24 1502  04/07/24 1624 04/08/24 0413 04/09/24 0118 04/10/24 0547 04/11/24 0417  NA 123*   < > 130* 131* 129* 130* 131*  K 4.7   < > 3.9 4.0 3.8 3.8 3.7  CL 87*   < > 90* 91* 88* 91* 92*  CO2 22   < > 26 28 25  32 28  GLUCOSE 81   < > 102* 90 125* 92 88  BUN 18   < > 19 16 23 17 15   CREATININE 0.72   < > 0.89 0.87 1.18* 0.81 0.80  CALCIUM 8.6*   < > 8.9 9.1 9.4 8.9 8.8*  MG 2.0  --   --  2.0 2.0 1.9 2.2   < > = values in this interval not displayed.   GFR: Estimated Creatinine Clearance: 48.2 mL/min (by C-G formula based on SCr of 0.8 mg/dL). Liver Function Tests: Recent Labs  Lab 04/06/24 0818  AST 31  ALT 23  ALKPHOS 86  BILITOT 0.2  PROT 6.9  ALBUMIN 3.5  No results for input(s): LIPASE, AMYLASE in the last 168 hours. No results for input(s): AMMONIA in the last 168 hours. Coagulation Profile: No results for input(s): INR, PROTIME in the last 168 hours. Cardiac Enzymes: No results for input(s): CKTOTAL, CKMB, CKMBINDEX, TROPONINI in the last 168 hours. BNP (last 3 results) Recent Labs    03/24/24 1110 04/03/24 1232  PROBNP 2,551.0* 6,228.0*   HbA1C: No results for input(s): HGBA1C in the last 72 hours. CBG: Recent Labs  Lab 04/09/24 0109  GLUCAP 117*   Lipid Profile: No results for input(s): CHOL, HDL, LDLCALC, TRIG, CHOLHDL, LDLDIRECT in the last 72 hours. Thyroid Function Tests: No results for input(s): TSH, T4TOTAL, FREET4, T3FREE, THYROIDAB in the last 72 hours. Anemia Panel: No results for input(s): VITAMINB12, FOLATE, FERRITIN, TIBC, IRON, RETICCTPCT in the last 72 hours. Sepsis Labs: Recent Labs  Lab 04/05/24 0429  PROCALCITON 0.14    Recent Results (from the past 240 hours)  Expectorated Sputum Assessment w Gram Stain, Rflx to Resp Cult     Status: None   Collection Time: 04/04/24  4:30 PM   Specimen: Sputum  Result Value Ref Range Status   Specimen Description SPUTUM  Final   Special Requests  Normal  Final   Sputum evaluation   Final    THIS SPECIMEN IS ACCEPTABLE FOR SPUTUM CULTURE Performed at [facility], [address] [place], [place] [address]    Report Status 04/04/2024 FINAL  Final  Culture, Respiratory w Gram Stain     Status: None   Collection Time: 04/04/24  4:30 PM   Specimen: SPU  Result Value Ref Range Status   Specimen Description   Final    SPUTUM Performed at [facility], [address] [place], [place] [address]    Special Requests   Final    Normal Reflexed from [phone] Performed at [facility], [address] [place], [place] [address]    Gram Stain   Final    ABUNDANT SQUAMOUS EPITHELIAL CELLS PRESENT NO WBC SEEN FEW GRAM POSITIVE RODS RARE YEAST RARE GRAM POSITIVE COCCI RARE GRAM NEGATIVE RODS    Culture   Final    FEW Normal respiratory flora-no Staph aureus or Pseudomonas seen Performed at [facility], [address], [place], [place] [address]    Report Status 04/08/2024 FINAL  Final  Resp panel by RT-PCR (RSV, Flu A&B, Covid) Nasal Mucosa     Status: Abnormal   Collection Time: 04/04/24  6:34 PM   Specimen: Nasal Mucosa; Nasal Swab  Result Value Ref Range Status   SARS Coronavirus 2 by RT PCR NEGATIVE NEGATIVE Final    Comment: (NOTE) SARS-CoV-2 target nucleic acids are NOT DETECTED.  The SARS-CoV-2 RNA is generally detectable in upper respiratory specimens during the acute phase of infection. The lowest concentration of SARS-CoV-2 viral copies this assay can detect is 138 copies/mL. A negative result does not preclude SARS-Cov-2 infection and should not be used as the sole basis for treatment or other patient management decisions. A negative result may occur with  improper specimen collection/handling, submission of specimen other than nasopharyngeal swab, presence of viral mutation(s) within the areas targeted by this assay, and inadequate number of viral copies(<138 copies/mL). A negative result must be combined  with clinical observations, patient history, and epidemiological information. The expected result is Negative.  Fact Sheet for Patients:  bloggercourse.com  Fact Sheet for Healthcare Providers:  seriousbroker.it  This test is no t yet approved or cleared by the  [place]  FDA and  has been authorized for detection and/or diagnosis of SARS-CoV-2 by FDA under an Emergency Use Authorization (EUA). This EUA will remain  in effect (meaning this test can be used) for the duration of the COVID-19 declaration under Section 564(b)(1) of the Act, 21 U.S.C.section 360bbb-3(b)(1), unless the authorization is terminated  or revoked sooner.       Influenza A by PCR POSITIVE (A) NEGATIVE Final   Influenza B by PCR NEGATIVE NEGATIVE Final    Comment: (NOTE) The Xpert Xpress SARS-CoV-2/FLU/RSV plus assay is intended as an aid in the diagnosis of influenza from Nasopharyngeal swab specimens and should not be used as a sole basis for treatment. Nasal washings and aspirates are unacceptable for Xpert Xpress SARS-CoV-2/FLU/RSV testing.  Fact Sheet for Patients: bloggercourse.com  Fact Sheet for Healthcare Providers: seriousbroker.it  This test is not yet approved or cleared by the [place]  FDA and has been authorized for detection and/or diagnosis of SARS-CoV-2 by FDA under an Emergency Use Authorization (EUA). This EUA will remain in effect (meaning this test can be used) for the duration of the COVID-19 declaration under Section 564(b)(1) of the Act, 21 U.S.C. section 360bbb-3(b)(1), unless the authorization is terminated or revoked.     Resp Syncytial Virus by PCR NEGATIVE NEGATIVE Final    Comment: (NOTE) Fact Sheet for Patients: bloggercourse.com  Fact Sheet for Healthcare Providers: seriousbroker.it  This test is not yet  approved or cleared by the [place]  FDA and has been authorized for detection and/or diagnosis of SARS-CoV-2 by FDA under an Emergency Use Authorization (EUA). This EUA will remain in effect (meaning this test can be used) for the duration of the COVID-19 declaration under Section 564(b)(1) of the Act, 21 U.S.C. section 360bbb-3(b)(1), unless the authorization is terminated or revoked.  Performed at [facility], [address] [place], [place] [address]   Respiratory (~20 pathogens) panel by PCR     Status: Abnormal   Collection Time: 04/04/24  6:34 PM   Specimen: Nasopharyngeal Swab; Respiratory  Result Value Ref Range Status   Adenovirus NOT DETECTED NOT DETECTED Final   Coronavirus 229E NOT DETECTED NOT DETECTED Final    Comment: (NOTE) The Coronavirus on the Respiratory Panel, DOES NOT test for the novel  Coronavirus (2019 nCoV)    Coronavirus HKU1 NOT DETECTED NOT DETECTED Final   Coronavirus NL63 NOT DETECTED NOT DETECTED Final   Coronavirus OC43 NOT DETECTED NOT DETECTED Final   Metapneumovirus NOT DETECTED NOT DETECTED Final   Rhinovirus / Enterovirus NOT DETECTED NOT DETECTED Final   Influenza A H3 DETECTED (A) NOT DETECTED Final   Influenza B NOT DETECTED NOT DETECTED Final   Parainfluenza Virus 1 NOT DETECTED NOT DETECTED Final   Parainfluenza Virus 2 NOT DETECTED NOT DETECTED Final   Parainfluenza Virus 3 NOT DETECTED NOT DETECTED Final   Parainfluenza Virus 4 NOT DETECTED NOT DETECTED Final   Respiratory Syncytial Virus NOT DETECTED NOT DETECTED Final   Bordetella pertussis NOT DETECTED NOT DETECTED Final   Bordetella Parapertussis NOT DETECTED NOT DETECTED Final   Chlamydophila pneumoniae NOT DETECTED NOT DETECTED Final   Mycoplasma pneumoniae NOT DETECTED NOT DETECTED Final    Comment: Performed at [facility], [address] [place], [place] [address]  MRSA Next Gen by PCR, Nasal     Status: None   Collection Time: 04/04/24  6:34 PM   Specimen:  Nasal Mucosa; Nasal Swab  Result Value Ref Range Status   MRSA by  PCR Next Gen NOT DETECTED NOT DETECTED Final    Comment: (NOTE) The GeneXpert MRSA Assay (FDA approved for NASAL specimens only), is one component of a comprehensive MRSA colonization surveillance program. It is not intended to diagnose MRSA infection nor to guide or monitor treatment for MRSA infections. Test performance is not FDA approved in patients less than 72 years old. Performed at [facility], [address] [place], [place] [address]          Radiology Studies: No results found.      Scheduled Meds:  Chlorhexidine  Gluconate Cloth  6 each Topical Daily   diltiazem   180 mg Oral Daily   metoprolol  tartrate  25 mg Oral BID   torsemide   40 mg Oral Daily   Continuous Infusions:  amiodarone  30 mg/hr (04/11/24 0741)     LOS: 8 days    Time spent: 55 minutes    Yoshi Mancillas D Maree, DO [facility]  If 7PM-7AM, please contact night-coverage [email or web address] 04/11/2024, 12:46 PM   "

## 2024-04-11 NOTE — Progress Notes (Signed)
 [facility] NEPHROLOGY PROGRESS NOTE  Assessment/ Plan: Pt is a 89 y.o. yo female with past medical history significant for hypertension, dyslipidemia, prediabetes, A-fib, CHF with preserved EF was recently hospitalized from 12/27 - 03/31/2024 for hyponatremia, CHF and A-fib with RVR presents now with worsening lower extremity edema and coughing. Our team saw the patient for hyponatremia which was treated with diuretics and a dose of tolvaptan  with improvement of sodium to 130.  Now we are reconsulted for worsening hyponatremia.  # Acute on chronic hyponatremia: Longstanding issue and historical problem.  Does have chronic lower extremity edema.  Hard to tell if this is lymphedema.  Does probably have some predisposition for volume overload related to heart failure with preserved ejection fraction.  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  Has responded to tolvaptan  in the past and got earlier this hospitalization.  Likely worse at this time related to viral illness - She was recently switched to orals diuretics - we briefly held due to concern for depletion.  Resumed torsemide  today at 40 mg daily dose  - She would benefit from outpatient nephrology follow-up on discharge   # Hypokalemia: improved   # Acute on chronic CHF with preserved EF: diuretics as above - daily weights and strict ins/outs  # Influenza/acute bronchitis:  - Supportive treatment per primary team.  # Atrial fibrillation with RVR - per primary team   # HTN/volume: BP acceptable. Volume as above  Disposition - continue inpatient monitoring     Subjective: She has continued in the ICU.  She had 1.3 liters UOP over 1/13 charted as well as one unmeasured urine void. She states that she took meds at home but didn't know that she was taking water pills.    Review of systems:     Denies shortness of breath or chest pain; reports a cough  Mild nausea; not much appetite  Doesn't feel great Got up  and walked today     Objective Vital signs in last 24 hours: Vitals:   04/11/24 1030 04/11/24 1100 04/11/24 1130 04/11/24 1159  BP: 111/65 (!) 116/58 126/72   Pulse: 86 86 78   Resp: 19 19 20    Temp:    97.8 F (36.6 C)  TempSrc:    Oral  SpO2: 99% 98% (!) 83%   Weight:      Height:       Weight change: -2.1 kg  Intake/Output Summary (Last 24 hours) at 04/11/2024 1302 Last data filed at 04/11/2024 1138 Gross per 24 hour  Intake 133.42 ml  Output 2075 ml  Net -1941.58 ml       Labs: RENAL PANEL Recent Labs  Lab 04/06/24 0542 04/06/24 0818 04/06/24 1502 04/07/24 1624 04/08/24 0413 04/09/24 0118 04/10/24 0547 04/11/24 0417  NA 123*  --    < > 130* 131* 129* 130* 131*  K 4.7  --    < > 3.9 4.0 3.8 3.8 3.7  CL 87*  --    < > 90* 91* 88* 91* 92*  CO2 22  --    < > 26 28 25  32 28  GLUCOSE 81  --    < > 102* 90 125* 92 88  BUN 18  --    < > 19 16 23 17 15   CREATININE 0.72  --    < > 0.89 0.87 1.18* 0.81 0.80  CALCIUM 8.6*  --    < > 8.9 9.1 9.4 8.9 8.8*  MG  2.0  --   --   --  2.0 2.0 1.9 2.2  ALBUMIN  --  3.5  --   --   --   --   --   --    < > = values in this interval not displayed.    Liver Function Tests: Recent Labs  Lab 04/06/24 0818  AST 31  ALT 23  ALKPHOS 86  BILITOT 0.2  PROT 6.9  ALBUMIN 3.5   No results for input(s): LIPASE, AMYLASE in the last 168 hours. No results for input(s): AMMONIA in the last 168 hours. CBC: Recent Labs    03/24/24 1118 03/24/24 1723 03/25/24 0552 03/25/24 1118 04/03/24 1232 04/04/24 0409 04/06/24 0542 04/07/24 0103 04/09/24 0118  HGB  --   --    < >  --  10.0* 9.7* 10.4* 11.1* 12.3  MCV  --   --    < >  --  90.1 88.3 89.6 90.2 88.4  VITAMINB12  --  1,127*  --  1,182*  --   --   --   --   --   FOLATE 7.6 11.6  --   --   --   --   --   --   --   FERRITIN  --   --   --  301  --   --   --   --   --   TIBC  --   --   --  248*  --   --   --   --   --   IRON  --   --   --  16*  --   --   --   --   --     < > = values in this interval not displayed.    Cardiac Enzymes: No results for input(s): CKTOTAL, CKMB, CKMBINDEX, TROPONINI in the last 168 hours. CBG: Recent Labs  Lab 04/09/24 0109  GLUCAP 117*    Iron Studies: No results for input(s): IRON, TIBC, TRANSFERRIN, FERRITIN in the last 72 hours. Studies/Results: No results found.   Medications: Infusions:  amiodarone  30 mg/hr (04/11/24 1239)     Scheduled Medications:  Chlorhexidine  Gluconate Cloth  6 each Topical Daily   diltiazem   180 mg Oral Daily   metoprolol  tartrate  25 mg Oral BID   torsemide   40 mg Oral Daily    have reviewed scheduled and prn medications.  Physical Exam:       General elderly female in bed in no acute distress HEENT normocephalic atraumatic extraocular movements intact sclera anicteric Neck supple trachea midline Lungs clear to auscultation bilaterally normal work of breathing at rest; with occ wet cough; on room air  Heart S1S2 no rub Abdomen soft nontender nondistended Extremities no edema appreciated; no cyanosis or clubbing  Psych normal mood and affect Neuro alert and conversant; provides hx and follows commands  Glenda Nguyen 04/11/2024,1:11 PM  LOS: 8 days

## 2024-04-11 NOTE — Progress Notes (Signed)
 Physical Therapy Treatment Patient Details Name: Glenda Nguyen MRN: [id number] DOB: April 01, 1934 Today's Date: 04/11/2024   History of Present Illness Glenda Nguyen is a 89 y.o. female with medical history significant for hypertension, hyperlipidemia, recently admitted on 03/24/2024 and discharged home on 03/31/2024 with new diagnoses of atrial fibrillation and hyponatremia, who presents to the ER with complaints of legs swelling and >2 weeks cough.  Denies any recent injury.  Has had difficulty ambulating due to the pain associated with the swelling of her feet and legs.     In the ER, tachycardic and tachypneic.  Chest x-ray was nonacute.  proBNP 6228.  CTA was negative for pulmonary embolism however showed a large right hilar lymph node measuring 14 mm.  Trace bilateral pleural effusions, left greater than right.  Volume overload on exam.       Has a dorsal left foot scar that has been present for months and is non tender without significant erythema, doubt cellulitis.  The patient received IV Lasix  in the ER.  Due to concerns for left dorsal foot cellulitis the patient also received IV clindamycin  by EDP.     Admitted by [facility], hospitalist service, for further management of acute on chronic HFpEF and questionable left foot cellulitis.    PT Comments  REASSESSMENT:  Patient demonstrates labored movement for sitting up at bedside, very unsteady on feet during transfer without AD, safer using RW and tolerated ambulating in room without loss of balance, but limited mostly due to fatigue, generalized weakness and increasing HR up to 130's. Patient tolerated sitting up in chair after therapy. Patient will benefit from continued skilled physical therapy in hospital and recommended venue below to increase strength, balance, endurance for safe ADLs and gait.      If plan is discharge home, recommend the following: A lot of help with bathing/dressing/bathroom;A lot of help with walking and/or  transfers;Assistance with cooking/housework;Assist for transportation;Help with stairs or ramp for entrance   Can travel by private vehicle     Yes  Equipment Recommendations  Rolling walker (2 wheels)    Recommendations for Other Services       Precautions / Restrictions Precautions Precautions: Fall Recall of Precautions/Restrictions: Intact Restrictions Weight Bearing Restrictions Per Provider Order: No     Mobility  Bed Mobility Overal bed mobility: Needs Assistance Bed Mobility: Supine to Sit     Supine to sit: Contact guard, Supervision     General bed mobility comments: increased time, labored movement    Transfers Overall transfer level: Needs assistance Equipment used: Rolling walker (2 wheels) Transfers: Sit to/from Stand, Bed to chair/wheelchair/BSC Sit to Stand: Min assist, Contact guard assist   Step pivot transfers: Min assist       General transfer comment: increase time, labored movment, difficulty transferring to/from commode due to BLE weakness    Ambulation/Gait Ambulation/Gait assistance: Min assist, Mod assist Gait Distance (Feet): 20 Feet Assistive device: Rolling walker (2 wheels) Gait Pattern/deviations: Decreased step length - right, Decreased stride length, Decreased step length - left, Decreased stance time - right Gait velocity: decreased     General Gait Details: increased endurance/distance for ambulation in room with slow labored movement, limited mostly due to fatigue and HR increasing from 110 bpm up to 133 bpm   Stairs             Wheelchair Mobility     Tilt Bed    Modified Rankin (Stroke Patients Only)       Balance Overall  balance assessment: Needs assistance Sitting-balance support: Feet supported, No upper extremity supported Sitting balance-Leahy Scale: Good Sitting balance - Comments: seated at EOB   Standing balance support: No upper extremity supported Standing balance-Leahy Scale:  Poor Standing balance comment: fair using RW                            Communication Communication Communication: No apparent difficulties  Cognition Arousal: Alert Behavior During Therapy: WFL for tasks assessed/performed   PT - Cognitive impairments: No apparent impairments                         Following commands: Intact      Cueing Cueing Techniques: Verbal cues, Tactile cues, Visual cues  Exercises      General Comments General comments (skin integrity, edema, etc.): HR increased to high of 130 BPM at one point. Saturation also seemed to dip to 72% at one point but went back up >90% evenetually.      Pertinent Vitals/Pain Pain Assessment Pain Assessment: No/denies pain    Home Living Family/patient expects to be discharged to:: Private residence Living Arrangements: Children;Other relatives Available Help at Discharge: Family;Available 24 hours/day;Friend(s) Type of Home: House Home Access: Stairs to enter Entrance Stairs-Rails: Right;Left;Can reach both Entrance Stairs-Number of Steps: 3   Home Layout: One level        Prior Function            PT Goals (current goals can now be found in the care plan section) Acute Rehab PT Goals Patient Stated Goal: return home PT Goal Formulation: With patient Time For Goal Achievement: 04/23/24 Potential to Achieve Goals: Good Progress towards PT goals: Progressing toward goals    Frequency    Min 3X/week      PT Plan      Co-evaluation PT/OT/SLP Co-Evaluation/Treatment: Yes Reason for Co-Treatment: To address functional/ADL transfers PT goals addressed during session: Mobility/safety with mobility;Balance;Proper use of DME OT goals addressed during session: ADL's and self-care      AM-PAC PT 6 Clicks Mobility   Outcome Measure  Help needed turning from your back to your side while in a flat bed without using bedrails?: A Little Help needed moving from lying on your back to  sitting on the side of a flat bed without using bedrails?: A Little Help needed moving to and from a bed to a chair (including a wheelchair)?: A Little Help needed standing up from a chair using your arms (e.g., wheelchair or bedside chair)?: A Little Help needed to walk in hospital room?: A Little Help needed climbing 3-5 steps with a railing? : A Lot 6 Click Score: 17    End of Session   Activity Tolerance: Patient tolerated treatment well;Patient limited by fatigue Patient left: in chair;with call bell/phone within reach Nurse Communication: Mobility status PT Visit Diagnosis: Unsteadiness on feet (R26.81);Other abnormalities of gait and mobility (R26.89);History of falling (Z91.81);Muscle weakness (generalized) (M62.81)     Time: 8893-8873 PT Time Calculation (min) (ACUTE ONLY): 20 min  Charges:      PT General Charges $$ ACUTE PT VISIT: 1 Visit                     2:31 PM, 04/11/2024 Lynwood Music, MPT Physical Therapist with [facility] [phone] office [phone] mobile phone

## 2024-04-11 NOTE — Progress Notes (Signed)
 "   Progress Note  Patient Name: Glenda Nguyen Date of Encounter: 04/11/2024  Primary Cardiologist: None  Subjective   Rates controlled.  No symptoms.  Inpatient Medications    Scheduled Meds:  Chlorhexidine  Gluconate Cloth  6 each Topical Daily   diltiazem   180 mg Oral Daily   metoprolol  tartrate  25 mg Oral BID   torsemide   40 mg Oral Daily   Continuous Infusions:  amiodarone  30 mg/hr (04/11/24 0741)   PRN Meds: benzonatate , guaiFENesin -dextromethorphan , ipratropium-albuterol , melatonin, mouth rinse, polyethylene glycol, prochlorperazine    Vital Signs    Vitals:   04/11/24 1000 04/11/24 1030 04/11/24 1100 04/11/24 1130  BP: 95/74 111/65 (!) 116/58 126/72  Pulse: 98 86 86 78  Resp: (!) 22 19 19 20   Temp:      TempSrc:      SpO2: 100% 99% 98% (!) 83%  Weight:      Height:        Intake/Output Summary (Last 24 hours) at 04/11/2024 1152 Last data filed at 04/11/2024 1138 Gross per 24 hour  Intake 133.42 ml  Output 2075 ml  Net -1941.58 ml   Filed Weights   04/10/24 0300 04/10/24 1000 04/11/24 0500  Weight: 64 kg 61.9 kg 62.3 kg    Telemetry     Personally reviewed.  In A-fib, HR 70-100s.  ECG    Not performed today.  Physical Exam   GEN: No acute distress.   Neck: No JVD. Cardiac: RRR, no murmur, rub, or gallop.  Respiratory: Nonlabored. Clear to auscultation bilaterally. GI: Soft, nontender, bowel sounds present. MS: No edema; No deformity. Neuro:  Nonfocal. Psych: Alert and oriented x 3. Normal affect.  Labs    Chemistry Recent Labs  Lab 04/06/24 0818 04/06/24 1502 04/09/24 0118 04/10/24 0547 04/11/24 0417  NA  --    < > 129* 130* 131*  K  --    < > 3.8 3.8 3.7  CL  --    < > 88* 91* 92*  CO2  --    < > 25 32 28  GLUCOSE  --    < > 125* 92 88  BUN  --    < > 23 17 15   CREATININE  --    < > 1.18* 0.81 0.80  CALCIUM  --    < > 9.4 8.9 8.8*  PROT 6.9  --   --   --   --   ALBUMIN 3.5  --   --   --   --   AST 31  --   --   --    --   ALT 23  --   --   --   --   ALKPHOS 86  --   --   --   --   BILITOT 0.2  --   --   --   --   GFRNONAA  --    < > 44* >60 >60  ANIONGAP  --    < > 16* 7 10   < > = values in this interval not displayed.     Hematology Recent Labs  Lab 04/06/24 0542 04/07/24 0103 04/09/24 0118  WBC 3.8* 4.2 11.2*  RBC 3.38* 3.69* 4.06  HGB 10.4* 11.1* 12.3  HCT 30.3* 33.3* 35.9*  MCV 89.6 90.2 88.4  MCH 30.8 30.1 30.3  MCHC 34.3 33.3 34.3  RDW 14.8 14.9 14.8  PLT 359 375 449*    Cardiac EnzymesNo results for input(s): TROPONINIHS in  the last 720 hours.  BNPNo results for input(s): BNP, PROBNP in the last 168 hours.   DDimerNo results for input(s): DDIMER in the last 168 hours.   Radiology    No results found.  Cardiac Studies     Assessment & Plan   Atrial fibrillation with RVR - New onset per EKG from 04/04/2024. - Telemetry reviewed, in A-fib, HR 70-100s. - Symptomatic with palpitations intermittently that improved now. - Continue amiodarone  drip till this evening and then switch to p.o. amiodarone  200 mg once daily. - Start metoprolol  tartrate 25 mg twice daily. - Continue diltiazem  180 mg once daily. - On Eliquis  PTA but due to fall, on hold.  CT head without contrast upon arrival to the ER showed soft tissue swelling with moderate hematoma overlying the right parietal vertex.  No intracranial hemorrhage was noted. - Echo normal.   Acute on chronic diastolic heart failure - Volume overloaded based on labs and physical exam on admission. - On IV diuretics, switched to p.o. torsemide  40 mg once daily (on p.o. torsemide  30 mg daily at home).  Nephrology managing fluid status due to severe hyponatremia.  Normal serum creatinine.   Acute bronchitis from influenza A infection - Completed 5-day course of oseltamivir .  Supportive care per primary team.   30-minute spent in reviewing prior medical records, reports, more than 3 labs, discussion and  documentation.  Signed, Diannah SHAUNNA Maywood, MD  04/11/2024, 11:52 AM    "

## 2024-04-11 NOTE — Plan of Care (Signed)

## 2024-04-11 NOTE — TOC Progression Note (Signed)
 Transition of Care [facility]) - Progression Note    Patient Details  Name: Glenda Nguyen MRN: [id number] Date of Birth: 1935-02-03  Transition of Care [facility]) CM/SW Contact  [place] Saddie Kim, [place] Phone Number: 04/11/2024, 2:05 PM  Clinical Narrative:  Pt would like to stay in  for SNF, but aware no beds at this time. She accepts bed at [facility]. Facility notified. Peer to peer completed and per MD, will need updated notes and evals closer to d/c to continue with auth. TOC will follow.        Barriers to Discharge: Continued Medical Work up               Expected Discharge Plan and Services                                   HH Arranged: OT, PT [facility] Agency: [facility] Date [facility] Agency Contacted: 04/04/24 Time HH Agency Contacted: 1247 Representative spoke with at [facility] Agency: [place]   Social Drivers of Health (SDOH) Interventions SDOH Screenings   Food Insecurity: No Food Insecurity (04/03/2024)  Housing: Unknown (04/04/2024)  Transportation Needs: No Transportation Needs (04/03/2024)  Utilities: Not At Risk (04/03/2024)  Social Connections: Unknown (04/03/2024)  Tobacco Use: Low Risk (04/03/2024)    Readmission Risk Interventions    03/29/2024   11:22 AM  Readmission Risk Prevention Plan  Post Dischage Appt Complete  Medication Screening Complete  Transportation Screening Complete

## 2024-04-11 NOTE — Plan of Care (Signed)
" °  Problem: Acute Rehab OT Goals (only OT should resolve) Goal: Pt. Will Perform Grooming Flowsheets (Taken 04/11/2024 1255) Pt Will Perform Grooming:  with modified independence  standing Goal: Pt. Will Perform Lower Body Bathing Flowsheets (Taken 04/11/2024 1255) Pt Will Perform Lower Body Bathing: with modified independence Goal: Pt. Will Perform Lower Body Dressing Flowsheets (Taken 04/11/2024 1255) Pt Will Perform Lower Body Dressing: with modified independence Goal: Pt. Will Transfer To Toilet Flowsheets (Taken 04/11/2024 1255) Pt Will Transfer to Toilet: with modified independence Goal: Pt. Will Perform Toileting-Clothing Manipulation Flowsheets (Taken 04/11/2024 1255) Pt Will Perform Toileting - Clothing Manipulation and hygiene: with modified independence Goal: Pt/Caregiver Will Perform Home Exercise Program Flowsheets (Taken 04/11/2024 1255) Pt/caregiver will Perform Home Exercise Program:  Increased strength  Both right and left upper extremity  Increased ROM  Independently  Coryn Mosso OT, MOT  "

## 2024-04-12 DIAGNOSIS — I5033 Acute on chronic diastolic (congestive) heart failure: Secondary | ICD-10-CM | POA: Diagnosis not present

## 2024-04-12 DIAGNOSIS — I4891 Unspecified atrial fibrillation: Secondary | ICD-10-CM | POA: Diagnosis not present

## 2024-04-12 LAB — HEPATIC FUNCTION PANEL
ALT: 14 U/L (ref 0–44)
AST: 19 U/L (ref 15–41)
Albumin: 3.2 g/dL — ABNORMAL LOW (ref 3.5–5.0)
Alkaline Phosphatase: 82 U/L (ref 38–126)
Bilirubin, Direct: 0.1 mg/dL (ref 0.0–0.2)
Indirect Bilirubin: 0.1 mg/dL — ABNORMAL LOW (ref 0.3–0.9)
Total Bilirubin: 0.3 mg/dL (ref 0.0–1.2)
Total Protein: 6.3 g/dL — ABNORMAL LOW (ref 6.5–8.1)

## 2024-04-12 LAB — CBC
HCT: 31.7 % — ABNORMAL LOW (ref 36.0–46.0)
Hemoglobin: 10.6 g/dL — ABNORMAL LOW (ref 12.0–15.0)
MCH: 29.9 pg (ref 26.0–34.0)
MCHC: 33.4 g/dL (ref 30.0–36.0)
MCV: 89.3 fL (ref 80.0–100.0)
Platelets: 384 K/uL (ref 150–400)
RBC: 3.55 MIL/uL — ABNORMAL LOW (ref 3.87–5.11)
RDW: 14.8 % (ref 11.5–15.5)
WBC: 6 K/uL (ref 4.0–10.5)
nRBC: 0 % (ref 0.0–0.2)

## 2024-04-12 LAB — BASIC METABOLIC PANEL WITH GFR
Anion gap: 11 (ref 5–15)
BUN: 16 mg/dL (ref 8–23)
CO2: 28 mmol/L (ref 22–32)
Calcium: 8.5 mg/dL — ABNORMAL LOW (ref 8.9–10.3)
Chloride: 89 mmol/L — ABNORMAL LOW (ref 98–111)
Creatinine, Ser: 0.94 mg/dL (ref 0.44–1.00)
GFR, Estimated: 58 mL/min — ABNORMAL LOW
Glucose, Bld: 83 mg/dL (ref 70–99)
Potassium: 3.4 mmol/L — ABNORMAL LOW (ref 3.5–5.1)
Sodium: 129 mmol/L — ABNORMAL LOW (ref 135–145)

## 2024-04-12 LAB — MAGNESIUM: Magnesium: 1.9 mg/dL (ref 1.7–2.4)

## 2024-04-12 MED ORDER — POTASSIUM CHLORIDE CRYS ER 20 MEQ PO TBCR
40.0000 meq | EXTENDED_RELEASE_TABLET | Freq: Two times a day (BID) | ORAL | Status: AC
  Administered 2024-04-12 (×2): 40 meq via ORAL
  Filled 2024-04-12 (×2): qty 2

## 2024-04-12 MED ORDER — AMIODARONE HCL 200 MG PO TABS
200.0000 mg | ORAL_TABLET | Freq: Every day | ORAL | Status: DC
  Administered 2024-04-12 – 2024-04-14 (×3): 200 mg via ORAL
  Filled 2024-04-12 (×3): qty 1

## 2024-04-12 NOTE — Progress Notes (Signed)
 "   Progress Note  Patient Name: Glenda Nguyen Date of Encounter: 04/12/2024  Primary Cardiologist: None  Subjective   Rates controlled.  No symptoms.  Inpatient Medications    Scheduled Meds:  Chlorhexidine  Gluconate Cloth  6 each Topical Daily   diltiazem   180 mg Oral Daily   metoprolol  tartrate  25 mg Oral BID   potassium chloride   40 mEq Oral BID   torsemide   40 mg Oral Daily   Continuous Infusions:  amiodarone  30 mg/hr (04/12/24 0835)   PRN Meds: acetaminophen , benzonatate , guaiFENesin -dextromethorphan , ipratropium-albuterol , melatonin, mouth rinse, polyethylene glycol, prochlorperazine    Vital Signs    Vitals:   04/12/24 0833 04/12/24 0930 04/12/24 1000 04/12/24 1030  BP: (!) 119/50 103/63 95/65 109/79  Pulse: 87 80 (!) 41 77  Resp:  16 19 19   Temp:      TempSrc:      SpO2:  97% 99% 99%  Weight:      Height:        Intake/Output Summary (Last 24 hours) at 04/12/2024 1048 Last data filed at 04/12/2024 0420 Gross per 24 hour  Intake 1188.34 ml  Output 1750 ml  Net -561.66 ml   Filed Weights   04/10/24 1000 04/11/24 0500 04/12/24 0500  Weight: 61.9 kg 62.3 kg 65 kg    Telemetry     Personally reviewed.  In A-fib, HR 70-80s  ECG    Not performed today.  Physical Exam   GEN: No acute distress.   Neck: No JVD. Cardiac: RRR, no murmur, rub, or gallop.  Respiratory: Nonlabored. Clear to auscultation bilaterally. GI: Soft, nontender, bowel sounds present. MS: No edema; No deformity. Neuro:  Nonfocal. Psych: Alert and oriented x 3. Normal affect.  Labs    Chemistry Recent Labs  Lab 04/06/24 0818 04/06/24 1502 04/10/24 0547 04/11/24 0417 04/12/24 0423  NA  --    < > 130* 131* 129*  K  --    < > 3.8 3.7 3.4*  CL  --    < > 91* 92* 89*  CO2  --    < > 32 28 28  GLUCOSE  --    < > 92 88 83  BUN  --    < > 17 15 16   CREATININE  --    < > 0.81 0.80 0.94  CALCIUM  --    < > 8.9 8.8* 8.5*  PROT 6.9  --   --   --  6.3*  ALBUMIN 3.5  --    --   --  3.2*  AST 31  --   --   --  19  ALT 23  --   --   --  14  ALKPHOS 86  --   --   --  82  BILITOT 0.2  --   --   --  0.3  GFRNONAA  --    < > >60 >60 58*  ANIONGAP  --    < > 7 10 11    < > = values in this interval not displayed.     Hematology Recent Labs  Lab 04/07/24 0103 04/09/24 0118 04/12/24 0423  WBC 4.2 11.2* 6.0  RBC 3.69* 4.06 3.55*  HGB 11.1* 12.3 10.6*  HCT 33.3* 35.9* 31.7*  MCV 90.2 88.4 89.3  MCH 30.1 30.3 29.9  MCHC 33.3 34.3 33.4  RDW 14.9 14.8 14.8  PLT 375 449* 384    Cardiac EnzymesNo results for input(s): TROPONINIHS in the last 720  hours.  BNPNo results for input(s): BNP, PROBNP in the last 168 hours.   DDimerNo results for input(s): DDIMER in the last 168 hours.   Radiology    No results found.   Assessment & Plan   Atrial fibrillation with RVR - New onset per EKG from 04/04/2024. - Telemetry reviewed, in A-fib, HR 70-80s. - Symptomatic with palpitations intermittently that improved now. - Stop amiodarone  drip, switch to p.o. amiodarone  200 mg once daily. - Continue metoprolol  tartrate 25 mg twice daily. - Continue diltiazem  180 mg once daily. - On Eliquis  PTA but due to fall, on hold.  CT head without contrast upon arrival to the ER showed soft tissue swelling with moderate hematoma overlying the right parietal vertex.  No intracranial hemorrhage was noted.  Continue to hold Eliquis  on discharge.  If she does not have any falls in the next 3 months, can consider resuming in the outpatient setting. - Echo normal.   Acute on chronic diastolic heart failure - Volume overloaded based on labs and physical exam on admission. - On IV diuretics, switched to p.o. torsemide  40 mg once daily (on p.o. torsemide  30 mg daily at home).  Nephrology managing fluid status due to severe hyponatremia.  Normal serum creatinine.   Acute bronchitis from influenza A infection - Completed 5-day course of oseltamivir .  Supportive care per primary  team.  [facility] will sign off.   Medication Recommendations: Continue above medications Other recommendations (labs, testing, etc): None Follow up as an outpatient: Cardiology outpatient follow-up in 2-4 weeks.   30-minute spent in reviewing prior medical records, reports, more than 3 labs, discussion and documentation.  Signed, Diannah SHAUNNA Maywood, MD  04/12/2024, 10:48 AM    "

## 2024-04-12 NOTE — Progress Notes (Addendum)
 [facility] NEPHROLOGY PROGRESS NOTE  Assessment/ Plan: Pt is a 89 y.o. yo female with past medical history significant for hypertension, dyslipidemia, prediabetes, A-fib, CHF with preserved EF was recently hospitalized from 12/27 - 03/31/2024 for hyponatremia, CHF and A-fib with RVR presents now with worsening lower extremity edema and coughing. Our team saw the patient for hyponatremia which was treated with diuretics and a dose of tolvaptan  with improvement of sodium to 130.  Now we are reconsulted for worsening hyponatremia.  # Acute on chronic hyponatremia: Longstanding issue and historical problem.  Does have chronic lower extremity edema.  Hard to tell if this is lymphedema.  Does probably have some predisposition for volume overload related to heart failure with preserved ejection fraction.  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  Has responded to tolvaptan  in the past and got earlier this hospitalization.  Likely worse at this time related to viral illness - She was recently switched to orals diuretics - we briefly held due to concern for depletion.   - Resumed torsemide  on 1/14 at 40 mg daily dose.  Weights variable but up if accurate.  Continue torsemide    - Defer tolvaptan  today  - She would benefit from outpatient nephrology follow-up on discharge  - addendum - fluid restrict to 1.2 liters a day   # Hypokalemia: see repletion is ordered   # Acute on chronic CHF with preserved EF: diuretics as above - daily weights and strict ins/outs  # Influenza/acute bronchitis:  - Supportive treatment per primary team.  # Atrial fibrillation with RVR - per primary team   # HTN/volume: BP acceptable. Volume as above  Disposition - continue inpatient monitoring     Subjective: She has continued in the ICU.  She had 1.8 liters UOP over 1/14 charted.  She states that she doesn't feel well today.      Review of systems:       Denies shortness of breath or chest  pain; reports a cough  She reports nausea; doesn't have an appetite - doesn't want to eat lunch     Objective Vital signs in last 24 hours: Vitals:   04/12/24 1030 04/12/24 1123 04/12/24 1133 04/12/24 1200  BP: 109/79   99/67  Pulse: 77   73  Resp: 19   18  Temp:   98 F (36.7 C)   TempSrc:   Oral   SpO2: 99% 100%  98%  Weight:      Height:       Weight change: 3.1 kg  Intake/Output Summary (Last 24 hours) at 04/12/2024 1220 Last data filed at 04/12/2024 0420 Gross per 24 hour  Intake 1188.34 ml  Output 950 ml  Net 238.34 ml       Labs: RENAL PANEL Recent Labs  Lab 04/06/24 0818 04/06/24 1502 04/08/24 0413 04/09/24 0118 04/10/24 0547 04/11/24 0417 04/12/24 0423  NA  --    < > 131* 129* 130* 131* 129*  K  --    < > 4.0 3.8 3.8 3.7 3.4*  CL  --    < > 91* 88* 91* 92* 89*  CO2  --    < > 28 25 32 28 28  GLUCOSE  --    < > 90 125* 92 88 83  BUN  --    < > 16 23 17 15 16   CREATININE  --    < > 0.87 1.18* 0.81 0.80 0.94  CALCIUM  --    < >  9.1 9.4 8.9 8.8* 8.5*  MG  --   --  2.0 2.0 1.9 2.2 1.9  ALBUMIN 3.5  --   --   --   --   --  3.2*   < > = values in this interval not displayed.    Liver Function Tests: Recent Labs  Lab 04/06/24 0818 04/12/24 0423  AST 31 19  ALT 23 14  ALKPHOS 86 82  BILITOT 0.2 0.3  PROT 6.9 6.3*  ALBUMIN 3.5 3.2*   No results for input(s): LIPASE, AMYLASE in the last 168 hours. No results for input(s): AMMONIA in the last 168 hours. CBC: Recent Labs    03/24/24 1118 03/24/24 1723 03/25/24 0552 03/25/24 1118 04/03/24 1232 04/04/24 0409 04/06/24 0542 04/07/24 0103 04/09/24 0118 04/12/24 0423  HGB  --   --    < >  --    < > 9.7* 10.4* 11.1* 12.3 10.6*  MCV  --   --    < >  --    < > 88.3 89.6 90.2 88.4 89.3  VITAMINB12  --  1,127*  --  1,182*  --   --   --   --   --   --   FOLATE 7.6 11.6  --   --   --   --   --   --   --   --   FERRITIN  --   --   --  301  --   --   --   --   --   --   TIBC  --   --   --  248*   --   --   --   --   --   --   IRON  --   --   --  16*  --   --   --   --   --   --    < > = values in this interval not displayed.    Cardiac Enzymes: No results for input(s): CKTOTAL, CKMB, CKMBINDEX, TROPONINI in the last 168 hours. CBG: Recent Labs  Lab 04/09/24 0109  GLUCAP 117*    Iron Studies: No results for input(s): IRON, TIBC, TRANSFERRIN, FERRITIN in the last 72 hours. Studies/Results: No results found.   Medications: Infusions:     Scheduled Medications:  amiodarone   200 mg Oral Daily   Chlorhexidine  Gluconate Cloth  6 each Topical Daily   diltiazem   180 mg Oral Daily   metoprolol  tartrate  25 mg Oral BID   potassium chloride   40 mEq Oral BID   torsemide   40 mg Oral Daily    have reviewed scheduled and prn medications.  Physical Exam:        General elderly female in chair in no acute distress HEENT normocephalic atraumatic extraocular movements intact sclera anicteric Neck supple trachea midline Lungs clear to auscultation bilaterally normal work of breathing at rest; with occ wet cough; on room air  Heart S1S2 no rub Abdomen soft nontender nondistended Extremities no edema appreciated; no cyanosis or clubbing  Psych normal mood and affect Neuro alert and conversant; provides hx and follows commands  Katheryn JAYSON Saba 04/12/2024,12:34 PM  LOS: 9 days

## 2024-04-12 NOTE — Plan of Care (Signed)

## 2024-04-12 NOTE — Plan of Care (Signed)
   Problem: Activity: Goal: Risk for activity intolerance will decrease Outcome: Progressing   Problem: Coping: Goal: Level of anxiety will decrease Outcome: Progressing

## 2024-04-12 NOTE — Plan of Care (Signed)

## 2024-04-12 NOTE — Progress Notes (Signed)
 " PROGRESS NOTE    Glenda Nguyen  FMW:[id number] DOB: [birth year] DOA: 04/03/2024 PCP: Vick Lurie, FNP   Brief Narrative:   89 year old female with a history of HFpEF, paroxysmal atrial fibrillation, hypertension, hyperlipidemia, prediabetes presenting with worsening lower extremity edema and coughing.  The patient was recently admitted to the hospital from 03/24/2024 to 03/31/2024 for hyponatremia, acute HFpEF, and new onset atrial fibrillation with RVR.  She was discharged home with torsemide  30 mg daily, Cardizem  CD 120 milligrams daily, and metoprolol  12.5 mg twice daily. The patient returns because of worsening lower extremity edema and cough.  She has had some difficulty ambulating secondary to pain associated with swelling of her legs and feet.  She was admitted for acute on chronic HFpEF and persistent atrial fibrillation with RVR and was started on diuresis as well as IV amiodarone  drip that is now being transition to oral dosing per cardiology.  She is also noted to have hyponatremia due to volume overload as well as SIADH and is being followed by nephrology with plans for fluid restriction of 1200 mL starting 1/15.  She has been transitioned off of IV amiodarone  drip to oral dosing per cardiology.  Repeat PT/OT evaluation ordered and pending.   Assessment & Plan:   Principal Problem:   Acute on chronic diastolic heart failure (HCC) Active Problems:   Hyponatremia   Atrial fibrillation with RVR (HCC)   Hypokalemia   Bronchitis with influenza   Novel influenza A respiratory infection  Assessment and Plan:  Acute on chronic HFpEF - 04/03/2024 CTA chest--negative PE, trace bilateral pleural effusions, left greater than right, biapical scarring,RUL calcified granuloma - 03/25/2024 echo EF 65 to 70%, no WMA, normal RVF - Continue lasix  to 40 mg IV bid>>increase to 80 mg BID - Continue on torsemide  40 mg daily per cardiology   Persistent atrial fibrillation with  RVR-resolved - apixaban  hold for now given recurrent falls and may consider resumption over the next 3 months if no further falls noted - 04/04/24--developed RVR>>started IV diltiazem  - 04/05/24--transition back to po diltiazem  - Echo within normal limits - 04/08/24--transitioned to Cardizem  CD 180 mg - 1/15-transitioned off of IV amiodarone  to oral dosing and okay to transfer to telemetry   Orthostasis/vasovagal syncope -pt had syncopal episode after standing 1/11 eveing - near syncope with PT 1/12 - Continue torsemide  as ordered   Hyponatremia -Sodium is 129 and plans are for 1200 mL fluid restriction per nephrology - due to volume overload and SIADH -required tolvaptan  last hospitalization -nephrology consult appreciated>>redose tovalptan 1/9 -check serum Osm--266 -urine Osm 581 -urine Na 69   Influenza Bronchitis -1/9 personally reviewed CXR--increased interstitial markings -PCT 0.14 - started oseltamivir  1/8--finished 5 days   Elevated troponin - Secondary to demand ischemia - No chest pain presently - Personally reviewed EKG--atrial fibrillation with nonspecific T changes   Large right hilar lymph node measuring 14 mm -Possibly reactive, will need to be monitored - Outpatient surveillance and possible PET scan   Generalized weakness PT OT evaluation>>SNF; needs to have more clinicals resubmitted once medically stable and reassessed Fall precautions.    DVT prophylaxis:SCDs, apixaban  held Code Status: Full Family Communication: None at bedside Disposition Plan:  Status is: Inpatient Remains inpatient appropriate because: Need for IV medications.   Consultants:  Cardiology Nephrology  Procedures:  None  Antimicrobials:  Anti-infectives (From admission, onward)    Start     Dose/Rate Route Frequency Ordered Stop   04/05/24 1515  oseltamivir  (TAMIFLU ) capsule 30 mg  30 mg Oral 2 times daily 04/05/24 1420 04/09/24 2107   04/05/24 1000  azithromycin   (ZITHROMAX ) tablet 250 mg  Status:  Discontinued       Placed in Followed by Linked Group   250 mg Oral Daily 04/04/24 0620 04/05/24 0732   04/04/24 0715  azithromycin  (ZITHROMAX ) tablet 500 mg       Placed in Followed by Linked Group   500 mg Oral  Once 04/04/24 0620 04/04/24 0629   04/04/24 0618  cefTRIAXone  (ROCEPHIN ) 1 g in sodium chloride  0.9 % 100 mL IVPB  Status:  Discontinued        1 g 200 mL/hr over 30 Minutes Intravenous Every 24 hours 04/04/24 0618 04/06/24 1715   04/04/24 0618  azithromycin  (ZITHROMAX ) 500 mg in sodium chloride  0.9 % 250 mL IVPB  Status:  Discontinued        500 mg 250 mL/hr over 60 Minutes Intravenous Every 24 hours 04/04/24 0618 04/04/24 0620   04/03/24 1615  clindamycin  (CLEOCIN ) IVPB 600 mg        600 mg 100 mL/hr over 30 Minutes Intravenous  Once 04/03/24 1600 04/03/24 1703      Subjective: Patient seen and evaluated today with no new acute complaints or concerns. No acute concerns or events noted overnight.  Heart rates are overall improved and patient has been transition to oral amiodarone .  Objective: Vitals:   04/12/24 1130 04/12/24 1133 04/12/24 1200 04/12/24 1230  BP: (!) 105/53  99/67 124/60  Pulse: (!) 55  73 85  Resp: (!) 22  18 (!) 22  Temp:  98 F (36.7 C)    TempSrc:  Oral    SpO2: 92%  98% 98%  Weight:      Height:        Intake/Output Summary (Last 24 hours) at 04/12/2024 1258 Last data filed at 04/12/2024 1239 Gross per 24 hour  Intake 1588.34 ml  Output 950 ml  Net 638.34 ml   Filed Weights   04/10/24 1000 04/11/24 0500 04/12/24 0500  Weight: 61.9 kg 62.3 kg 65 kg    Examination:  General exam: Appears calm and comfortable  Respiratory system: Clear to auscultation. Respiratory effort normal. Cardiovascular system: S1 & S2 heard, irregular and rate controlled Gastrointestinal system: Abdomen is soft Central nervous system: Alert and awake Extremities: No edema Skin: No significant lesions noted Psychiatry:  Flat affect.    Data Reviewed: I have personally reviewed following labs and imaging studies  CBC: Recent Labs  Lab 04/06/24 0542 04/07/24 0103 04/09/24 0118 04/12/24 0423  WBC 3.8* 4.2 11.2* 6.0  HGB 10.4* 11.1* 12.3 10.6*  HCT 30.3* 33.3* 35.9* 31.7*  MCV 89.6 90.2 88.4 89.3  PLT 359 375 449* 384   Basic Metabolic Panel: Recent Labs  Lab 04/08/24 0413 04/09/24 0118 04/10/24 0547 04/11/24 0417 04/12/24 0423  NA 131* 129* 130* 131* 129*  K 4.0 3.8 3.8 3.7 3.4*  CL 91* 88* 91* 92* 89*  CO2 28 25 32 28 28  GLUCOSE 90 125* 92 88 83  BUN 16 23 17 15 16   CREATININE 0.87 1.18* 0.81 0.80 0.94  CALCIUM 9.1 9.4 8.9 8.8* 8.5*  MG 2.0 2.0 1.9 2.2 1.9   GFR: Estimated Creatinine Clearance: 41 mL/min (by C-G formula based on SCr of 0.94 mg/dL). Liver Function Tests: Recent Labs  Lab 04/06/24 0818 04/12/24 0423  AST 31 19  ALT 23 14  ALKPHOS 86 82  BILITOT 0.2 0.3  PROT 6.9 6.3*  ALBUMIN  3.5 3.2*   No results for input(s): LIPASE, AMYLASE in the last 168 hours. No results for input(s): AMMONIA in the last 168 hours. Coagulation Profile: No results for input(s): INR, PROTIME in the last 168 hours. Cardiac Enzymes: No results for input(s): CKTOTAL, CKMB, CKMBINDEX, TROPONINI in the last 168 hours. BNP (last 3 results) Recent Labs    03/24/24 1110 04/03/24 1232  PROBNP 2,551.0* 6,228.0*   HbA1C: No results for input(s): HGBA1C in the last 72 hours. CBG: Recent Labs  Lab 04/09/24 0109  GLUCAP 117*   Lipid Profile: No results for input(s): CHOL, HDL, LDLCALC, TRIG, CHOLHDL, LDLDIRECT in the last 72 hours. Thyroid Function Tests: No results for input(s): TSH, T4TOTAL, FREET4, T3FREE, THYROIDAB in the last 72 hours. Anemia Panel: No results for input(s): VITAMINB12, FOLATE, FERRITIN, TIBC, IRON, RETICCTPCT in the last 72 hours. Sepsis Labs: No results for input(s): PROCALCITON, LATICACIDVEN in the last  168 hours.   Recent Results (from the past 240 hours)  Expectorated Sputum Assessment w Gram Stain, Rflx to Resp Cult     Status: None   Collection Time: 04/04/24  4:30 PM   Specimen: Sputum  Result Value Ref Range Status   Specimen Description SPUTUM  Final   Special Requests Normal  Final   Sputum evaluation   Final    THIS SPECIMEN IS ACCEPTABLE FOR SPUTUM CULTURE Performed at [facility], [address] [place], [place] [address]    Report Status 04/04/2024 FINAL  Final  Culture, Respiratory w Gram Stain     Status: None   Collection Time: 04/04/24  4:30 PM   Specimen: SPU  Result Value Ref Range Status   Specimen Description   Final    SPUTUM Performed at [facility], [address] [place], [place] [address]    Special Requests   Final    Normal Reflexed from [id number] Performed at [facility], [address], [place], [place] [address]    Gram Stain   Final    ABUNDANT SQUAMOUS EPITHELIAL CELLS PRESENT NO WBC SEEN FEW GRAM POSITIVE RODS RARE YEAST RARE GRAM POSITIVE COCCI RARE GRAM NEGATIVE RODS    Culture   Final    FEW Normal respiratory flora-no Staph aureus or Pseudomonas seen Performed at [facility], [address] [place], [place] [address]    Report Status 04/08/2024 FINAL  Final  Resp panel by RT-PCR (RSV, Flu A&B, Covid) Nasal Mucosa     Status: Abnormal   Collection Time: 04/04/24  6:34 PM   Specimen: Nasal Mucosa; Nasal Swab  Result Value Ref Range Status   SARS Coronavirus 2 by RT PCR NEGATIVE NEGATIVE Final    Comment: (NOTE) SARS-CoV-2 target nucleic acids are NOT DETECTED.  The SARS-CoV-2 RNA is generally detectable in upper respiratory specimens during the acute phase of infection. The lowest concentration of SARS-CoV-2 viral copies this assay can detect is 138 copies/mL. A negative result does not preclude SARS-Cov-2 infection and should not be used as the sole basis for treatment or other patient management decisions. A negative  result may occur with  improper specimen collection/handling, submission of specimen other than nasopharyngeal swab, presence of viral mutation(s) within the areas targeted by this assay, and inadequate number of viral copies(<138 copies/mL). A negative result must be combined with clinical observations, patient history, and epidemiological information. The expected result is Negative.  Fact Sheet for Patients:  bloggercourse.com  Fact Sheet for Healthcare Providers:  seriousbroker.it  This test is no t yet  approved or cleared by the [place]  FDA and  has been authorized for detection and/or diagnosis of SARS-CoV-2 by FDA under an Emergency Use Authorization (EUA). This EUA will remain  in effect (meaning this test can be used) for the duration of the COVID-19 declaration under Section 564(b)(1) of the Act, 21 U.S.C.section 360bbb-3(b)(1), unless the authorization is terminated  or revoked sooner.       Influenza A by PCR POSITIVE (A) NEGATIVE Final   Influenza B by PCR NEGATIVE NEGATIVE Final    Comment: (NOTE) The Xpert Xpress SARS-CoV-2/FLU/RSV plus assay is intended as an aid in the diagnosis of influenza from Nasopharyngeal swab specimens and should not be used as a sole basis for treatment. Nasal washings and aspirates are unacceptable for Xpert Xpress SARS-CoV-2/FLU/RSV testing.  Fact Sheet for Patients: bloggercourse.com  Fact Sheet for Healthcare Providers: seriousbroker.it  This test is not yet approved or cleared by the [place]  FDA and has been authorized for detection and/or diagnosis of SARS-CoV-2 by FDA under an Emergency Use Authorization (EUA). This EUA will remain in effect (meaning this test can be used) for the duration of the COVID-19 declaration under Section 564(b)(1) of the Act, 21 U.S.C. section 360bbb-3(b)(1), unless the authorization is  terminated or revoked.     Resp Syncytial Virus by PCR NEGATIVE NEGATIVE Final    Comment: (NOTE) Fact Sheet for Patients: bloggercourse.com  Fact Sheet for Healthcare Providers: seriousbroker.it  This test is not yet approved or cleared by the [place]  FDA and has been authorized for detection and/or diagnosis of SARS-CoV-2 by FDA under an Emergency Use Authorization (EUA). This EUA will remain in effect (meaning this test can be used) for the duration of the COVID-19 declaration under Section 564(b)(1) of the Act, 21 U.S.C. section 360bbb-3(b)(1), unless the authorization is terminated or revoked.  Performed at [facility], [address], [place], [place] [address]   Respiratory (~20 pathogens) panel by PCR     Status: Abnormal   Collection Time: 04/04/24  6:34 PM   Specimen: Nasopharyngeal Swab; Respiratory  Result Value Ref Range Status   Adenovirus NOT DETECTED NOT DETECTED Final   Coronavirus 229E NOT DETECTED NOT DETECTED Final    Comment: (NOTE) The Coronavirus on the Respiratory Panel, DOES NOT test for the novel  Coronavirus (2019 nCoV)    Coronavirus HKU1 NOT DETECTED NOT DETECTED Final   Coronavirus NL63 NOT DETECTED NOT DETECTED Final   Coronavirus OC43 NOT DETECTED NOT DETECTED Final   Metapneumovirus NOT DETECTED NOT DETECTED Final   Rhinovirus / Enterovirus NOT DETECTED NOT DETECTED Final   Influenza A H3 DETECTED (A) NOT DETECTED Final   Influenza B NOT DETECTED NOT DETECTED Final   Parainfluenza Virus 1 NOT DETECTED NOT DETECTED Final   Parainfluenza Virus 2 NOT DETECTED NOT DETECTED Final   Parainfluenza Virus 3 NOT DETECTED NOT DETECTED Final   Parainfluenza Virus 4 NOT DETECTED NOT DETECTED Final   Respiratory Syncytial Virus NOT DETECTED NOT DETECTED Final   Bordetella pertussis NOT DETECTED NOT DETECTED Final   Bordetella Parapertussis NOT DETECTED NOT DETECTED Final   Chlamydophila pneumoniae  NOT DETECTED NOT DETECTED Final   Mycoplasma pneumoniae NOT DETECTED NOT DETECTED Final    Comment: Performed at [facility], [address], [place], [place] [address]  MRSA Next Gen by PCR, Nasal     Status: None   Collection Time: 04/04/24  6:34 PM   Specimen: Nasal Mucosa; Nasal Swab  Result Value Ref Range  Status   MRSA by PCR Next Gen NOT DETECTED NOT DETECTED Final    Comment: (NOTE) The GeneXpert MRSA Assay (FDA approved for NASAL specimens only), is one component of a comprehensive MRSA colonization surveillance program. It is not intended to diagnose MRSA infection nor to guide or monitor treatment for MRSA infections. Test performance is not FDA approved in patients less than 73 years old. Performed at [facility], [address], [place], [place] [address]          Radiology Studies: No results found.      Scheduled Meds:  amiodarone   200 mg Oral Daily   Chlorhexidine  Gluconate Cloth  6 each Topical Daily   diltiazem   180 mg Oral Daily   metoprolol  tartrate  25 mg Oral BID   potassium chloride   40 mEq Oral BID   torsemide   40 mg Oral Daily     LOS: 9 days    Time spent: 55 minutes    Lesley Galentine JONETTA Fairly, DO [facility]  If 7PM-7AM, please contact night-coverage [email or web address] 04/12/2024, 12:58 PM   "

## 2024-04-13 DIAGNOSIS — I5033 Acute on chronic diastolic (congestive) heart failure: Secondary | ICD-10-CM | POA: Diagnosis not present

## 2024-04-13 LAB — BASIC METABOLIC PANEL WITH GFR
Anion gap: 11 (ref 5–15)
BUN: 17 mg/dL (ref 8–23)
CO2: 26 mmol/L (ref 22–32)
Calcium: 8.8 mg/dL — ABNORMAL LOW (ref 8.9–10.3)
Chloride: 92 mmol/L — ABNORMAL LOW (ref 98–111)
Creatinine, Ser: 0.96 mg/dL (ref 0.44–1.00)
GFR, Estimated: 57 mL/min — ABNORMAL LOW
Glucose, Bld: 81 mg/dL (ref 70–99)
Potassium: 4.2 mmol/L (ref 3.5–5.1)
Sodium: 129 mmol/L — ABNORMAL LOW (ref 135–145)

## 2024-04-13 LAB — CBC
HCT: 32.3 % — ABNORMAL LOW (ref 36.0–46.0)
Hemoglobin: 10.8 g/dL — ABNORMAL LOW (ref 12.0–15.0)
MCH: 30 pg (ref 26.0–34.0)
MCHC: 33.4 g/dL (ref 30.0–36.0)
MCV: 89.7 fL (ref 80.0–100.0)
Platelets: 408 K/uL — ABNORMAL HIGH (ref 150–400)
RBC: 3.6 MIL/uL — ABNORMAL LOW (ref 3.87–5.11)
RDW: 14.7 % (ref 11.5–15.5)
WBC: 5.9 K/uL (ref 4.0–10.5)
nRBC: 0 % (ref 0.0–0.2)

## 2024-04-13 LAB — MAGNESIUM: Magnesium: 2 mg/dL (ref 1.7–2.4)

## 2024-04-13 MED ORDER — LOPERAMIDE HCL 2 MG PO CAPS
2.0000 mg | ORAL_CAPSULE | Freq: Once | ORAL | Status: AC
  Administered 2024-04-13: 2 mg via ORAL
  Filled 2024-04-13: qty 1

## 2024-04-13 NOTE — Progress Notes (Signed)
 Physical Therapy Treatment Patient Details Name: Glenda Nguyen MRN: [id number] DOB: 07/30/1934 Today's Date: 04/13/2024   History of Present Illness Glenda Nguyen is a 89 y.o. female with medical history significant for hypertension, hyperlipidemia, recently admitted on 03/24/2024 and discharged home on 03/31/2024 with new diagnoses of atrial fibrillation and hyponatremia, who presents to the ER with complaints of legs swelling and >2 weeks cough.  Denies any recent injury.  Has had difficulty ambulating due to the pain associated with the swelling of her feet and legs.     In the ER, tachycardic and tachypneic.  Chest x-ray was nonacute.  proBNP 6228.  CTA was negative for pulmonary embolism however showed a large right hilar lymph node measuring 14 mm.  Trace bilateral pleural effusions, left greater than right.  Volume overload on exam.       Has a dorsal left foot scar that has been present for months and is non tender without significant erythema, doubt cellulitis.  The patient received IV Lasix  in the ER.  Due to concerns for left dorsal foot cellulitis the patient also received IV clindamycin  by EDP.     Admitted by [facility], hospitalist service, for further management of acute on chronic HFpEF and questionable left foot cellulitis.    PT Comments  Patient was agreeable to PT treatment session. Patient was received sitting in chair, requesting to use restroom. Patient was able to stand and ambulate into restroom without AD but close CGA. Pt demonstrated some mild unsteadiness but no overt LOB. Supervision during toileting needs with pt able to stand from toilet using grab bars and perform pericare independently in standing. Pt loses balance slightly when ambulating to sink but is able to self correct without any external assistance. RW is used to ambulate into hallway, CGA given for safety. Improved balance of note with RW. Pt returns to chair at end of session, with chair alarm set. Pt is given  print out of seated HEP for LE strengthening to do while seated in bed or chair. Pt reports understanding. Pt daughter in law present at end of session. Patient is making good progress towards goals and return to PLOF. Updated post-acute discharge recommendation. SW aware. Patient will benefit from continued skilled physical therapy acutely in order to continue progress towards independence.     If plan is discharge home, recommend the following: A lot of help with bathing/dressing/bathroom;A lot of help with walking and/or transfers;Assistance with cooking/housework;Assist for transportation;Help with stairs or ramp for entrance   Can travel by private vehicle        Equipment Recommendations  Rolling walker (2 wheels)    Recommendations for Other Services       Precautions / Restrictions Precautions Precautions: Fall Recall of Precautions/Restrictions: Intact Restrictions Weight Bearing Restrictions Per Provider Order: No     Mobility  Bed Mobility               General bed mobility comments: pt was received sitting in chair at start of session    Transfers Overall transfer level: Needs assistance Equipment used: None Transfers: Sit to/from Stand Sit to Stand: Supervision, Contact guard assist           General transfer comment: STS from chair and toilet during session, CGA for first STS and supervision for STS form toilet with pt using rails to assist indicating LE weakness    Ambulation/Gait Ambulation/Gait assistance: Contact guard assist Gait Distance (Feet): 80 Feet Assistive device: Rolling walker (2 wheels) Gait  Pattern/deviations: Decreased step length - right, Decreased stride length, Decreased step length - left, Step-through pattern Gait velocity: decreased     General Gait Details: pt ambulates in room, and into restroom w/ RW and close CGA, one quick instance of unsteadiness when leaving out of restroom but pt able to self correct without outside  assistance, ambulates into halls with RW and CGA for safety, no overt LOB or unsteadiness througout, limited by fatigue   Stairs             Wheelchair Mobility     Tilt Bed    Modified Rankin (Stroke Patients Only)       Balance Overall balance assessment: Needs assistance Sitting-balance support: Feet supported, No upper extremity supported Sitting balance-Leahy Scale: Good Sitting balance - Comments: seated in chair   Standing balance support: No upper extremity supported, During functional activity, Bilateral upper extremity supported Standing balance-Leahy Scale: Fair Standing balance comment: fair without RW, good with RW          Communication Communication Communication: No apparent difficulties  Cognition Arousal: Alert Behavior During Therapy: WFL for tasks assessed/performed   PT - Cognitive impairments: No apparent impairments       Following commands: Intact      Cueing Cueing Techniques: Verbal cues, Visual cues  Exercises      General Comments        Pertinent Vitals/Pain Pain Assessment Pain Assessment: No/denies pain    Home Living                          Prior Function            PT Goals (current goals can now be found in the care plan section) Acute Rehab PT Goals Patient Stated Goal: return home PT Goal Formulation: With patient Time For Goal Achievement: 04/23/24 Potential to Achieve Goals: Good Progress towards PT goals: Progressing toward goals    Frequency    Min 3X/week      PT Plan      Co-evaluation              AM-PAC PT 6 Clicks Mobility   Outcome Measure  Help needed turning from your back to your side while in a flat bed without using bedrails?: A Little Help needed moving from lying on your back to sitting on the side of a flat bed without using bedrails?: A Little Help needed moving to and from a bed to a chair (including a wheelchair)?: A Little Help needed standing up  from a chair using your arms (e.g., wheelchair or bedside chair)?: A Little Help needed to walk in hospital room?: A Little Help needed climbing 3-5 steps with a railing? : A Little 6 Click Score: 18    End of Session   Activity Tolerance: Patient tolerated treatment well;Patient limited by fatigue Patient left: in chair;with call bell/phone within reach;with family/visitor present;with chair alarm set Nurse Communication: Mobility status PT Visit Diagnosis: Unsteadiness on feet (R26.81);Other abnormalities of gait and mobility (R26.89);History of falling (Z91.81);Muscle weakness (generalized) (M62.81)     Time: 8489-8474 PT Time Calculation (min) (ACUTE ONLY): 15 min  Charges:    $Therapeutic Activity: 8-22 mins PT General Charges $$ ACUTE PT VISIT: 1 Visit                     4:02 PM, 04/13/24 Glenda Nguyen, PT, DPT [place] with [facility]

## 2024-04-13 NOTE — Care Management Important Message (Signed)
 Important Message  Patient Details  Name: Glenda Nguyen MRN: [id number] Date of Birth: Feb 02, 1935   Important Message Given:  Yes - Medicare IM     Analena Gama L Parthena Fergeson 04/13/2024, 5:04 PM

## 2024-04-13 NOTE — TOC Progression Note (Signed)
 Transition of Care [facility]) - Progression Note    Patient Details  Name: Glenda Nguyen MRN: [id number] Date of Birth: 1934/09/02  Transition of Care [facility]) CM/SW Contact  Hoy DELENA Bigness, LCSW Phone Number: 04/13/2024, 3:44 PM  Clinical Narrative:    Per PT pt did well during session and is now being recommended for [facility]. Met with pt/daughter at bedside to discuss change in recommendation. Pt agreeable to return home with HH through [facility]. HHPT/OT arranged with [facility]. HH order will need to be placed prior to discharge.    Expected Discharge Plan: Skilled Nursing Facility Barriers to Discharge: Continued Medical Work up               Expected Discharge Plan and Services                                   HH Arranged: OT, PT [facility] Agency: [facility] Date [facility] Agency Contacted: 04/13/24 Time HH Agency Contacted: [phone] Representative spoke with at [facility] Agency: [place]   Social Drivers of Health (SDOH) Interventions SDOH Screenings   Food Insecurity: No Food Insecurity (04/03/2024)  Housing: Unknown (04/04/2024)  Transportation Needs: No Transportation Needs (04/03/2024)  Utilities: Not At Risk (04/03/2024)  Social Connections: Unknown (04/03/2024)  Tobacco Use: Low Risk (04/03/2024)    Readmission Risk Interventions    04/13/2024    3:43 PM 04/13/2024    3:03 PM 03/29/2024   11:22 AM  Readmission Risk Prevention Plan  Post Dischage Appt   Complete  Medication Screening   Complete  Transportation Screening Complete Complete Complete  PCP or Specialist Appt within 5-7 Days  Complete   Home Care Screening  Complete   Medication Review (RN CM)  Complete

## 2024-04-13 NOTE — Progress Notes (Signed)
 " PROGRESS NOTE    Glenda Nguyen  FMW:[id number] DOB: 05/24/1934 DOA: 04/03/2024 PCP: Vick Lurie, FNP   Brief Narrative:   89 year old female with a history of HFpEF, paroxysmal atrial fibrillation, hypertension, hyperlipidemia, prediabetes presenting with worsening lower extremity edema and coughing.  The patient was recently admitted to the hospital from 03/24/2024 to 03/31/2024 for hyponatremia, acute HFpEF, and new onset atrial fibrillation with RVR.  She was discharged home with torsemide  30 mg daily, Cardizem  CD 120 milligrams daily, and metoprolol  12.5 mg twice daily. The patient returns because of worsening lower extremity edema and cough.  She has had some difficulty ambulating secondary to pain associated with swelling of her legs and feet.  She was admitted for acute on chronic HFpEF and persistent atrial fibrillation with RVR and was started on diuresis as well as IV amiodarone  drip that is now being transition to oral dosing per cardiology.  She is also noted to have hyponatremia due to volume overload as well as SIADH and is being followed by nephrology with plans for fluid restriction of 1200 mL starting 1/15.  She has been transitioned off of IV amiodarone  drip to oral dosing per cardiology.  Repeat PT/OT evaluation ordered and pending.  Following sodium levels.   Assessment & Plan:   Principal Problem:   Acute on chronic diastolic heart failure (HCC) Active Problems:   Hyponatremia   Atrial fibrillation with RVR (HCC)   Hypokalemia   Bronchitis with influenza   Novel influenza A respiratory infection  Assessment and Plan:  Acute on chronic HFpEF - 04/03/2024 CTA chest--negative PE, trace bilateral pleural effusions, left greater than right, biapical scarring,RUL calcified granuloma - 03/25/2024 echo EF 65 to 70%, no WMA, normal RVF - Continue lasix  to 40 mg IV bid>>increase to 80 mg BID - Continue on torsemide  40 mg daily per cardiology   Persistent atrial  fibrillation with RVR-resolved - apixaban  hold for now given recurrent falls and may consider resumption over the next 3 months if no further falls noted - 04/04/24--developed RVR>>started IV diltiazem  - 04/05/24--transition back to po diltiazem  - Echo within normal limits - 04/08/24--transitioned to Cardizem  CD 180 mg - 1/15-transitioned off of IV amiodarone  to oral dosing and okay to transfer to telemetry   Orthostasis/vasovagal syncope -pt had syncopal episode after standing 1/11 eveing - near syncope with PT 1/12 - Continue torsemide  as ordered   Hyponatremia -Sodium is 129 and plans are for 1200 mL fluid restriction per nephrology - due to volume overload and SIADH -required tolvaptan  last hospitalization, may require redosing of tolvaptan  and needs further monitoring in inpatient setting -check serum Osm--266 -urine Osm 581 -urine Na 69   Influenza Bronchitis -1/9 personally reviewed CXR--increased interstitial markings -PCT 0.14 - started oseltamivir  1/8--finished 5 days   Elevated troponin - Secondary to demand ischemia - No chest pain presently - Personally reviewed EKG--atrial fibrillation with nonspecific T changes   Large right hilar lymph node measuring 14 mm -Possibly reactive, will need to be monitored - Outpatient surveillance and possible PET scan   Generalized weakness PT OT evaluation>>SNF; needs to have more clinicals resubmitted once medically stable and reassessed Fall precautions.    DVT prophylaxis:SCDs, apixaban  held Code Status: Full Family Communication: None at bedside Disposition Plan:  Status is: Inpatient Remains inpatient appropriate because: Need for IV medications.   Consultants:  Cardiology Nephrology  Procedures:  None  Antimicrobials:  Anti-infectives (From admission, onward)    Start     Dose/Rate Route Frequency Ordered Stop  04/05/24 1515  oseltamivir  (TAMIFLU ) capsule 30 mg        30 mg Oral 2 times daily 04/05/24 1420  04/09/24 2107   04/05/24 1000  azithromycin  (ZITHROMAX ) tablet 250 mg  Status:  Discontinued       Placed in Followed by Linked Group   250 mg Oral Daily 04/04/24 0620 04/05/24 0732   04/04/24 0715  azithromycin  (ZITHROMAX ) tablet 500 mg       Placed in Followed by Linked Group   500 mg Oral  Once 04/04/24 0620 04/04/24 0629   04/04/24 0618  cefTRIAXone  (ROCEPHIN ) 1 g in sodium chloride  0.9 % 100 mL IVPB  Status:  Discontinued        1 g 200 mL/hr over 30 Minutes Intravenous Every 24 hours 04/04/24 0618 04/06/24 1715   04/04/24 0618  azithromycin  (ZITHROMAX ) 500 mg in sodium chloride  0.9 % 250 mL IVPB  Status:  Discontinued        500 mg 250 mL/hr over 60 Minutes Intravenous Every 24 hours 04/04/24 0618 04/04/24 0620   04/03/24 1615  clindamycin  (CLEOCIN ) IVPB 600 mg        600 mg 100 mL/hr over 30 Minutes Intravenous  Once 04/03/24 1600 04/03/24 1703      Subjective: Patient seen and evaluated today with no new acute complaints or concerns. No acute concerns or events noted overnight.  Overall appears well with stable heart rates.  Sodium level still remain low.  Objective: Vitals:   04/12/24 1815 04/12/24 2008 04/13/24 0131 04/13/24 0900  BP: 128/65 109/63 103/62 102/64  Pulse: 72 80 71 89  Resp:  18 17 20   Temp: 98.7 F (37.1 C) (!) 97.4 F (36.3 C) (!) 97.4 F (36.3 C) 98.2 F (36.8 C)  TempSrc: Oral Oral Oral Oral  SpO2: 93% 99% 94% 96%  Weight:   62.4 kg   Height:        Intake/Output Summary (Last 24 hours) at 04/13/2024 1244 Last data filed at 04/13/2024 0900 Gross per 24 hour  Intake 720 ml  Output --  Net 720 ml   Filed Weights   04/11/24 0500 04/12/24 0500 04/13/24 0131  Weight: 62.3 kg 65 kg 62.4 kg    Examination:  General exam: Appears calm and comfortable  Respiratory system: Clear to auscultation. Respiratory effort normal. Cardiovascular system: S1 & S2 heard, irregular and rate controlled Gastrointestinal system: Abdomen is soft Central  nervous system: Alert and awake Extremities: No edema Skin: No significant lesions noted Psychiatry: Flat affect.    Data Reviewed: I have personally reviewed following labs and imaging studies  CBC: Recent Labs  Lab 04/07/24 0103 04/09/24 0118 04/12/24 0423 04/13/24 0414  WBC 4.2 11.2* 6.0 5.9  HGB 11.1* 12.3 10.6* 10.8*  HCT 33.3* 35.9* 31.7* 32.3*  MCV 90.2 88.4 89.3 89.7  PLT 375 449* 384 408*   Basic Metabolic Panel: Recent Labs  Lab 04/09/24 0118 04/10/24 0547 04/11/24 0417 04/12/24 0423 04/13/24 0414  NA 129* 130* 131* 129* 129*  K 3.8 3.8 3.7 3.4* 4.2  CL 88* 91* 92* 89* 92*  CO2 25 32 28 28 26   GLUCOSE 125* 92 88 83 81  BUN 23 17 15 16 17   CREATININE 1.18* 0.81 0.80 0.94 0.96  CALCIUM 9.4 8.9 8.8* 8.5* 8.8*  MG 2.0 1.9 2.2 1.9 2.0   GFR: Estimated Creatinine Clearance: 40.1 mL/min (by C-G formula based on SCr of 0.96 mg/dL). Liver Function Tests: Recent Labs  Lab 04/12/24 0423  AST  19  ALT 14  ALKPHOS 82  BILITOT 0.3  PROT 6.3*  ALBUMIN 3.2*   No results for input(s): LIPASE, AMYLASE in the last 168 hours. No results for input(s): AMMONIA in the last 168 hours. Coagulation Profile: No results for input(s): INR, PROTIME in the last 168 hours. Cardiac Enzymes: No results for input(s): CKTOTAL, CKMB, CKMBINDEX, TROPONINI in the last 168 hours. BNP (last 3 results) Recent Labs    03/24/24 1110 04/03/24 1232  PROBNP 2,551.0* 6,228.0*   HbA1C: No results for input(s): HGBA1C in the last 72 hours. CBG: Recent Labs  Lab 04/09/24 0109  GLUCAP 117*   Lipid Profile: No results for input(s): CHOL, HDL, LDLCALC, TRIG, CHOLHDL, LDLDIRECT in the last 72 hours. Thyroid Function Tests: No results for input(s): TSH, T4TOTAL, FREET4, T3FREE, THYROIDAB in the last 72 hours. Anemia Panel: No results for input(s): VITAMINB12, FOLATE, FERRITIN, TIBC, IRON, RETICCTPCT in the last 72 hours. Sepsis  Labs: No results for input(s): PROCALCITON, LATICACIDVEN in the last 168 hours.   Recent Results (from the past 240 hours)  Expectorated Sputum Assessment w Gram Stain, Rflx to Resp Cult     Status: None   Collection Time: 04/04/24  4:30 PM   Specimen: Sputum  Result Value Ref Range Status   Specimen Description SPUTUM  Final   Special Requests Normal  Final   Sputum evaluation   Final    THIS SPECIMEN IS ACCEPTABLE FOR SPUTUM CULTURE Performed at [facility], [address], [place], [place] [address]    Report Status 04/04/2024 FINAL  Final  Culture, Respiratory w Gram Stain     Status: None   Collection Time: 04/04/24  4:30 PM   Specimen: SPU  Result Value Ref Range Status   Specimen Description   Final    SPUTUM Performed at [facility], [address], [place], [place] [address]    Special Requests   Final    Normal Reflexed from [id number] Performed at [facility], [address], [place], [place] [address]    Gram Stain   Final    ABUNDANT SQUAMOUS EPITHELIAL CELLS PRESENT NO WBC SEEN FEW GRAM POSITIVE RODS RARE YEAST RARE GRAM POSITIVE COCCI RARE GRAM NEGATIVE RODS    Culture   Final    FEW Normal respiratory flora-no Staph aureus or Pseudomonas seen Performed at [facility], [address], [place], [place] [address]    Report Status 04/08/2024 FINAL  Final  Resp panel by RT-PCR (RSV, Flu A&B, Covid) Nasal Mucosa     Status: Abnormal   Collection Time: 04/04/24  6:34 PM   Specimen: Nasal Mucosa; Nasal Swab  Result Value Ref Range Status   SARS Coronavirus 2 by RT PCR NEGATIVE NEGATIVE Final    Comment: (NOTE) SARS-CoV-2 target nucleic acids are NOT DETECTED.  The SARS-CoV-2 RNA is generally detectable in upper respiratory specimens during the acute phase of infection. The lowest concentration of SARS-CoV-2 viral copies this assay can detect is 138 copies/mL. A negative result does not preclude SARS-Cov-2 infection and should not be used as the sole  basis for treatment or other patient management decisions. A negative result may occur with  improper specimen collection/handling, submission of specimen other than nasopharyngeal swab, presence of viral mutation(s) within the areas targeted by this assay, and inadequate number of viral copies(<138 copies/mL). A negative result must be combined with clinical observations, patient history, and epidemiological information. The expected result is Negative.  Fact Sheet for Patients:  bloggercourse.com  Fact Sheet for Healthcare Providers:  seriousbroker.it  This test is no t yet approved or cleared by the [place]  FDA and  has been authorized for detection and/or diagnosis of SARS-CoV-2 by FDA under an Emergency Use Authorization (EUA). This EUA will remain  in effect (meaning this test can be used) for the duration of the COVID-19 declaration under Section 564(b)(1) of the Act, 21 U.S.C.section 360bbb-3(b)(1), unless the authorization is terminated  or revoked sooner.       Influenza A by PCR POSITIVE (A) NEGATIVE Final   Influenza B by PCR NEGATIVE NEGATIVE Final    Comment: (NOTE) The Xpert Xpress SARS-CoV-2/FLU/RSV plus assay is intended as an aid in the diagnosis of influenza from Nasopharyngeal swab specimens and should not be used as a sole basis for treatment. Nasal washings and aspirates are unacceptable for Xpert Xpress SARS-CoV-2/FLU/RSV testing.  Fact Sheet for Patients: bloggercourse.com  Fact Sheet for Healthcare Providers: seriousbroker.it  This test is not yet approved or cleared by the [place]  FDA and has been authorized for detection and/or diagnosis of SARS-CoV-2 by FDA under an Emergency Use Authorization (EUA). This EUA will remain in effect (meaning this test can be used) for the duration of the COVID-19 declaration under Section 564(b)(1) of the  Act, 21 U.S.C. section 360bbb-3(b)(1), unless the authorization is terminated or revoked.     Resp Syncytial Virus by PCR NEGATIVE NEGATIVE Final    Comment: (NOTE) Fact Sheet for Patients: bloggercourse.com  Fact Sheet for Healthcare Providers: seriousbroker.it  This test is not yet approved or cleared by the [place]  FDA and has been authorized for detection and/or diagnosis of SARS-CoV-2 by FDA under an Emergency Use Authorization (EUA). This EUA will remain in effect (meaning this test can be used) for the duration of the COVID-19 declaration under Section 564(b)(1) of the Act, 21 U.S.C. section 360bbb-3(b)(1), unless the authorization is terminated or revoked.  Performed at [facility], [address] [place], [place] [address]   Respiratory (~20 pathogens) panel by PCR     Status: Abnormal   Collection Time: 04/04/24  6:34 PM   Specimen: Nasopharyngeal Swab; Respiratory  Result Value Ref Range Status   Adenovirus NOT DETECTED NOT DETECTED Final   Coronavirus 229E NOT DETECTED NOT DETECTED Final    Comment: (NOTE) The Coronavirus on the Respiratory Panel, DOES NOT test for the novel  Coronavirus (2019 nCoV)    Coronavirus HKU1 NOT DETECTED NOT DETECTED Final   Coronavirus NL63 NOT DETECTED NOT DETECTED Final   Coronavirus OC43 NOT DETECTED NOT DETECTED Final   Metapneumovirus NOT DETECTED NOT DETECTED Final   Rhinovirus / Enterovirus NOT DETECTED NOT DETECTED Final   Influenza A H3 DETECTED (A) NOT DETECTED Final   Influenza B NOT DETECTED NOT DETECTED Final   Parainfluenza Virus 1 NOT DETECTED NOT DETECTED Final   Parainfluenza Virus 2 NOT DETECTED NOT DETECTED Final   Parainfluenza Virus 3 NOT DETECTED NOT DETECTED Final   Parainfluenza Virus 4 NOT DETECTED NOT DETECTED Final   Respiratory Syncytial Virus NOT DETECTED NOT DETECTED Final   Bordetella pertussis NOT DETECTED NOT DETECTED Final   Bordetella  Parapertussis NOT DETECTED NOT DETECTED Final   Chlamydophila pneumoniae NOT DETECTED NOT DETECTED Final   Mycoplasma pneumoniae NOT DETECTED NOT DETECTED Final    Comment: Performed at [facility], [address], [place], [place] [address]  MRSA Next Gen by PCR, Nasal     Status: None   Collection Time: 04/04/24  6:34 PM   Specimen: Nasal Mucosa; Nasal Swab  Result Value Ref Range Status   MRSA by PCR Next Gen NOT DETECTED NOT DETECTED Final    Comment: (NOTE) The GeneXpert MRSA Assay (FDA approved for NASAL specimens only), is one component of a comprehensive MRSA colonization surveillance program. It is not intended to diagnose MRSA infection nor to guide or monitor treatment for MRSA infections. Test performance is not FDA approved in patients less than 83 years old. Performed at [facility], [address], [place], [place] [address]          Radiology Studies: No results found.      Scheduled Meds:  amiodarone   200 mg Oral Daily   Chlorhexidine  Gluconate Cloth  6 each Topical Daily   diltiazem   180 mg Oral Daily   metoprolol  tartrate  25 mg Oral BID   torsemide   40 mg Oral Daily     LOS: 10 days    Time spent: 55 minutes    Svetlana Bagby JONETTA Fairly, DO [facility]  If 7PM-7AM, please contact night-coverage [email or web address] 04/13/2024, 12:44 PM   "

## 2024-04-13 NOTE — Progress Notes (Signed)
 Mobility Specialist Progress Note:    04/13/24 1235  Mobility  Activity Ambulated with assistance  Level of Assistance Contact guard assist, steadying assist  Assistive Device None  Distance Ambulated (ft) 12 ft  Range of Motion/Exercises Active;All extremities  Activity Response Tolerated well  Mobility Referral Yes  Mobility visit 1 Mobility  Mobility Specialist Start Time (ACUTE ONLY) 1235  Mobility Specialist Stop Time (ACUTE ONLY) 1250  Mobility Specialist Time Calculation (min) (ACUTE ONLY) 15 min   Pt received in chair, requesting assistance to bathroom. Required CGA to stand and ambulate with no AD. Tolerated well, denies dizziness. Left at sink with RN, all needs met.  Glenda Nguyen Mobility Specialist Please contact via Special Educational Needs Teacher or  Rehab office at [phone]

## 2024-04-13 NOTE — Progress Notes (Signed)
 [facility] NEPHROLOGY PROGRESS NOTE  Assessment/ Plan: Pt is a 89 y.o. yo female with past medical history significant for hypertension, dyslipidemia, prediabetes, A-fib, CHF with preserved EF was recently hospitalized from 12/27 - 03/31/2024 for hyponatremia, CHF and A-fib with RVR presents now with worsening lower extremity edema and coughing. Our team saw the patient for hyponatremia which was treated with diuretics and a dose of tolvaptan  with improvement of sodium to 130.  Now we are reconsulted for worsening hyponatremia.  # Acute on chronic hyponatremia: Longstanding issue and historical problem.  Does have chronic lower extremity edema.  Hard to tell if this is lymphedema.  Does probably have some predisposition for volume overload related to heart failure with preserved ejection fraction.  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  Has responded to tolvaptan  in the past and got earlier this hospitalization.  Likely worse at this time related to viral illness.  Back on oral diuretics at 40 mg daily (resumed no 1/14) ------------------ - Continue torsemide  at 40 mg daily and would plan to continue this dose on discharge - BMP daily x 5 days ordered - Defer tolvaptan  today though may benefit from one more dose if sodium worsens.  Her appetite is improving so she would soon be better able to respond to thirst if overcorrecting    - She would benefit from outpatient nephrology follow-up on discharge  - fluid restrict to 1.2 liters a day   # Hypokalemia: improved with repletion  # Acute on chronic CHF with preserved EF: diuretics as above - daily weights and strict ins/outs - home regimen is not verified and she states she doesn't know what pills she takes; listed as 30 mg daily torsemide    # Influenza/acute bronchitis:  - Supportive treatment per primary team.  # Atrial fibrillation with RVR  - per primary team   # HTN/volume:  - BP acceptable but low on  agents for afib per primary team  - diuretics as above   # Diarrhea - Immodium once today   Disposition - continue inpatient monitoring.  Nephrology will review the patient's chart over the weekend.  Please reach out to nephrology on call for [facility] with questions or concerns      Subjective:  She moved to the floor.  She had 300 mL UOP over 1/15 as well as 3 unmeasured urine voids.  She states that she is eating a little more - had some breakfast and is expecting lunch soon.  She feels better today.   Review of systems:   Denies shortness of breath or chest pain; reports a cough  Nausea better; doesn't have a great appetite but getting better; has had diarrhea and asks for a PRN.  States she has diarrhea whenever she eats now     Objective Vital signs in last 24 hours: Vitals:   04/12/24 1815 04/12/24 2008 04/13/24 0131 04/13/24 0900  BP: 128/65 109/63 103/62 102/64  Pulse: 72 80 71 89  Resp:  18 17 20   Temp: 98.7 F (37.1 C) (!) 97.4 F (36.3 C) (!) 97.4 F (36.3 C) 98.2 F (36.8 C)  TempSrc: Oral Oral Oral Oral  SpO2: 93% 99% 94% 96%  Weight:   62.4 kg   Height:       Weight change: -2.6 kg  Intake/Output Summary (Last 24 hours) at 04/13/2024 1104 Last data filed at 04/13/2024 0500 Gross per 24 hour  Intake 880 ml  Output 300 ml  Net 580  ml       Labs: RENAL PANEL Recent Labs  Lab 04/09/24 0118 04/10/24 0547 04/11/24 0417 04/12/24 0423 04/13/24 0414  NA 129* 130* 131* 129* 129*  K 3.8 3.8 3.7 3.4* 4.2  CL 88* 91* 92* 89* 92*  CO2 25 32 28 28 26   GLUCOSE 125* 92 88 83 81  BUN 23 17 15 16 17   CREATININE 1.18* 0.81 0.80 0.94 0.96  CALCIUM 9.4 8.9 8.8* 8.5* 8.8*  MG 2.0 1.9 2.2 1.9 2.0  ALBUMIN  --   --   --  3.2*  --     Liver Function Tests: Recent Labs  Lab 04/12/24 0423  AST 19  ALT 14  ALKPHOS 82  BILITOT 0.3  PROT 6.3*  ALBUMIN 3.2*   No results for input(s): LIPASE, AMYLASE in the last 168 hours. No results for input(s):  AMMONIA in the last 168 hours. CBC: Recent Labs    03/24/24 1118 03/24/24 1723 03/25/24 0552 03/25/24 1118 04/03/24 1232 04/06/24 0542 04/07/24 0103 04/09/24 0118 04/12/24 0423 04/13/24 0414  HGB  --   --    < >  --    < > 10.4* 11.1* 12.3 10.6* 10.8*  MCV  --   --    < >  --    < > 89.6 90.2 88.4 89.3 89.7  VITAMINB12  --  1,127*  --  1,182*  --   --   --   --   --   --   FOLATE 7.6 11.6  --   --   --   --   --   --   --   --   FERRITIN  --   --   --  301  --   --   --   --   --   --   TIBC  --   --   --  248*  --   --   --   --   --   --   IRON  --   --   --  16*  --   --   --   --   --   --    < > = values in this interval not displayed.    Cardiac Enzymes: No results for input(s): CKTOTAL, CKMB, CKMBINDEX, TROPONINI in the last 168 hours. CBG: Recent Labs  Lab 04/09/24 0109  GLUCAP 117*    Iron Studies: No results for input(s): IRON, TIBC, TRANSFERRIN, FERRITIN in the last 72 hours. Studies/Results: No results found.   Medications: Infusions:     Scheduled Medications:  amiodarone   200 mg Oral Daily   Chlorhexidine  Gluconate Cloth  6 each Topical Daily   diltiazem   180 mg Oral Daily   metoprolol  tartrate  25 mg Oral BID   torsemide   40 mg Oral Daily    have reviewed scheduled and prn medications.  Physical Exam:          General elderly female in chair in no acute distress HEENT normocephalic atraumatic extraocular movements intact sclera anicteric Neck supple trachea midline Lungs right basilar crackles otherwise auscultation bilaterally normal work of breathing at rest; with occ wet cough; on room air  Heart S1S2 no rub Abdomen soft nontender nondistended Extremities no to trace edema appreciated; no cyanosis or clubbing  Psych normal mood and affect Neuro alert and conversant; provides hx and follows commands   Katheryn BROCKS Yariel Ferraris 04/13/2024,11:27 AM  LOS: 10 days

## 2024-04-13 NOTE — Care Management Important Message (Signed)
 Important Message  Patient Details  Name: Glenda Nguyen MRN: [id number] Date of Birth: [birth year]   Important Message Given:  Yes - Medicare IM     Doyne Micke L Tarnesha Ulloa 04/13/2024, 4:38 PM

## 2024-04-14 DIAGNOSIS — I5033 Acute on chronic diastolic (congestive) heart failure: Secondary | ICD-10-CM | POA: Diagnosis not present

## 2024-04-14 LAB — BASIC METABOLIC PANEL WITH GFR
Anion gap: 13 (ref 5–15)
BUN: 22 mg/dL (ref 8–23)
CO2: 25 mmol/L (ref 22–32)
Calcium: 8.8 mg/dL — ABNORMAL LOW (ref 8.9–10.3)
Chloride: 92 mmol/L — ABNORMAL LOW (ref 98–111)
Creatinine, Ser: 1.11 mg/dL — ABNORMAL HIGH (ref 0.44–1.00)
GFR, Estimated: 48 mL/min — ABNORMAL LOW
Glucose, Bld: 93 mg/dL (ref 70–99)
Potassium: 4 mmol/L (ref 3.5–5.1)
Sodium: 130 mmol/L — ABNORMAL LOW (ref 135–145)

## 2024-04-14 LAB — MAGNESIUM: Magnesium: 2.1 mg/dL (ref 1.7–2.4)

## 2024-04-14 MED ORDER — AMIODARONE HCL 200 MG PO TABS: 200.0000 mg | ORAL_TABLET | Freq: Every day | ORAL | 1 refills | Status: AC

## 2024-04-14 MED ORDER — BENZONATATE 100 MG PO CAPS: 100.0000 mg | ORAL_CAPSULE | Freq: Two times a day (BID) | ORAL | 0 refills | Status: AC | PRN

## 2024-04-14 MED ORDER — GUAIFENESIN-DM 100-10 MG/5ML PO SYRP: 5.0000 mL | ORAL_SOLUTION | ORAL | 0 refills | Status: AC | PRN

## 2024-04-14 MED ORDER — DILTIAZEM HCL ER COATED BEADS 180 MG PO CP24: 180.0000 mg | ORAL_CAPSULE | Freq: Every day | ORAL | 1 refills | Status: AC

## 2024-04-14 MED ORDER — METOPROLOL TARTRATE 25 MG PO TABS: 25.0000 mg | ORAL_TABLET | Freq: Two times a day (BID) | ORAL | 2 refills | Status: AC

## 2024-04-14 MED ORDER — TORSEMIDE 40 MG PO TABS: 40.0000 mg | ORAL_TABLET | Freq: Every day | ORAL | 2 refills | Status: AC

## 2024-04-14 NOTE — Progress Notes (Signed)
 AM labs reviewed remotely. Na up to 130. Stay the course with fluid restriction of 1.2L and torsemide  40mg  daily. No indication to re-dose tolvaptan . Monitor Cr for now. Will continue to follow chart. Please call with any q's/concerns in the interim.  Ephriam Stank, MD [facility]

## 2024-04-14 NOTE — Plan of Care (Signed)
   Problem: Clinical Measurements: Goal: Cardiovascular complication will be avoided Outcome: Progressing   Problem: Activity: Goal: Risk for activity intolerance will decrease Outcome: Progressing   Problem: Safety: Goal: Ability to remain free from injury will improve Outcome: Progressing

## 2024-04-14 NOTE — TOC Transition Note (Signed)
 Transition of Care [facility]) - Discharge Note   Patient Details  Name: Glenda Nguyen MRN: [id number] Date of Birth: January 15, 1935  [facility]) CM/SW Contact:  Ronnald MARLA Sil, RN Phone Number: 04/14/2024, 10:47 AM   Clinical Narrative:    Patient discussed during morning's Progression rounds with plan to discharge home today with [facility] HHPT/OT/RN, CM confirmed orders placed and contacted agency liaison - CJennifer to notify of patient's DC.   Patient with no additional needs at this time, however CM will continue to follow along and assist as appropriate    Final next level of care: Home w Home Health Services Barriers to Discharge: No Barriers Identified   Patient Goals and CMS Choice Patient states their goals for this hospitalization and ongoing recovery are:: Return home with HHPT, OT, & RN          Discharge Placement                       Discharge Plan and Services Additional resources added to the After Visit Summary for                            HH Arranged: PT, OT, RN [facility] Agency: [facility] Date [facility] Agency Contacted: 04/14/24 Time HH Agency Contacted: 1047 Representative spoke with at [facility] Agency: [place] via Text  Social Drivers of Health (SDOH) Interventions SDOH Screenings   Food Insecurity: No Food Insecurity (04/03/2024)  Housing: Unknown (04/04/2024)  Transportation Needs: No Transportation Needs (04/03/2024)  Utilities: Not At Risk (04/03/2024)  Social Connections: Unknown (04/03/2024)  Tobacco Use: Low Risk (04/03/2024)     Readmission Risk Interventions    04/13/2024    3:43 PM 04/13/2024    3:03 PM 03/29/2024   11:22 AM  Readmission Risk Prevention Plan  Post Dischage Appt   Complete  Medication Screening   Complete  Transportation Screening Complete Complete Complete  PCP or Specialist Appt within 5-7 Days  Complete   Home Care Screening  Complete   Medication Review (RN CM)  Complete

## 2024-04-14 NOTE — Progress Notes (Signed)
 Patient has discharge orders, discharge teaching given to patient and no further questions at this time. Awaiting arrival of daughter.

## 2024-04-14 NOTE — Discharge Summary (Signed)
 Physician Discharge Summary  Glenda Nguyen FMW:[id number] DOB: Sep 26, 1934 DOA: 04/03/2024  PCP: Vick Lurie, FNP  Admit date: 04/03/2024  Discharge date: 04/14/2024  Admitted From:Home  Disposition:  Home  Recommendations for Outpatient Follow-up:  Follow up with PCP in 1-2 weeks, recommend follow-up of large right hilar lymph node measuring 14 mm with outpatient surveillance is possible PET scan Follow-up with cardiology as scheduled on 2//26 at 1 PM Remain on amiodarone , diltiazem , and metoprolol  as ordered per cardiology Follow-up with nephrology with referral sent regarding hyponatremia Remain on torsemide  40 mg daily as recommended as well as 1200 mL fluid restriction Continue other home medications as prior  Home Health: Yes with PT/OT/RN  Equipment/Devices: None  Discharge Condition:Stable  CODE STATUS: Full  Diet recommendation: Heart Healthy with 1200 mL fluid restriction  Brief/Interim Summary: 89 year old female with a history of HFpEF, paroxysmal atrial fibrillation, hypertension, hyperlipidemia, prediabetes presenting with worsening lower extremity edema and coughing.  The patient was recently admitted to the hospital from 03/24/2024 to 03/31/2024 for hyponatremia, acute HFpEF, and new onset atrial fibrillation with RVR.  She was discharged home with torsemide  30 mg daily, Cardizem  CD 120 milligrams daily, and metoprolol  12.5 mg twice daily. The patient returns because of worsening lower extremity edema and cough.  She has had some difficulty ambulating secondary to pain associated with swelling of her legs and feet.  She was admitted for acute on chronic HFpEF and persistent atrial fibrillation with RVR and was started on diuresis as well as IV amiodarone  drip that is now being transition to oral dosing per cardiology.  She is also noted to have hyponatremia due to volume overload as well as SIADH and is being followed by nephrology with plans for fluid restriction of  1200 mL starting 1/15.  She has been transitioned off of IV amiodarone  drip to oral dosing per cardiology.  Repeat PT/OT evaluation, now with recommendations for home health PT.  Sodium levels have stabilized and are slowly improving with last sodium level of 130.  Case discussed with nephrology with recommendations to discharge on torsemide  as ordered as well as 1200 mL fluid restriction and follow-up outpatient.  She remains asymptomatic from this standpoint and heart rates have remained stable.  She has outpatient cardiology appointment already scheduled.  Discharge Diagnoses:  Principal Problem:   Acute on chronic diastolic heart failure (HCC) Active Problems:   Hyponatremia   Atrial fibrillation with RVR (HCC)   Hypokalemia   Bronchitis with influenza   Novel influenza A respiratory infection  Principal discharge diagnosis: Acute on chronic HFpEF with persistent atrial fibrillation with RVR-resolved.  Hyponatremia in the setting of volume overload as well as SIADH.  Influenza bronchitis-resolved.  Discharge Instructions  Discharge Instructions     (HEART FAILURE PATIENTS) Call MD:  Anytime you have any of the following symptoms: 1) 3 pound weight gain in 24 hours or 5 pounds in 1 week 2) shortness of breath, with or without a dry hacking cough 3) swelling in the hands, feet or stomach 4) if you have to sleep on extra pillows at night in order to breathe.   Complete by: As directed    Ambulatory referral to Nephrology   Complete by: As directed    Increase activity slowly   Complete by: As directed       Allergies as of 04/14/2024       Reactions   Penicillins Rash        Medication List     TAKE these  medications    amiodarone  200 MG tablet Commonly known as: PACERONE  Take 1 tablet (200 mg total) by mouth daily.   apixaban  5 MG Tabs tablet Commonly known as: ELIQUIS  Take 1 tablet (5 mg total) by mouth 2 (two) times daily.   benzonatate  100 MG capsule Commonly  known as: TESSALON  Take 1 capsule (100 mg total) by mouth 2 (two) times daily as needed for cough.   CALCIUM PO Take 1 tablet by mouth daily.   diltiazem  180 MG 24 hr capsule Commonly known as: CARDIZEM  CD Take 1 capsule (180 mg total) by mouth daily. Start taking on: April 15, 2024 What changed:  medication strength how much to take   guaiFENesin -dextromethorphan  100-10 MG/5ML syrup Commonly known as: ROBITUSSIN DM Take 5 mLs by mouth every 4 (four) hours as needed for cough (chest congestion).   metoprolol  tartrate 25 MG tablet Commonly known as: LOPRESSOR  Take 1 tablet (25 mg total) by mouth 2 (two) times daily.   Torsemide  40 MG Tabs Take 40 mg by mouth daily. Start taking on: April 15, 2024 What changed:  medication strength how much to take   triamcinolone cream 0.5 % Commonly known as: KENALOG Apply 1 application  topically 3 (three) times daily as needed.        Contact information for follow-up providers     [facility] Follow up.   Specialty: [facility] Contact information: [address] [place] [address] [phone]         Sheron Lorette GRADE, PA-C Follow up on 05/02/2024.   Specialties: Physician Assistant, Cardiology Why: Cardiology Hospital Follow-up on 05/02/2024 at 1:00 PM. Will be at the [facility]. Contact information: [address] [place] [address] [phone]         [facility], [facility]. Go to.   Contact information: [address] [place] [address] [phone]              Contact information for after-discharge care     Destination     [facility] .   Service: Skilled Nursing Contact information: [address] [facility] [place]  [address] [phone]             Home Medical Care     [facility]) .   Service: Home Health Services Contact information: [address] [place]  [place] [address] [phone]                    Allergies[1]  Consultations: Cardiology Nephrology   Procedures/Studies: CT HEAD WO CONTRAST ( ) Result Date: 04/08/2024 EXAM: CT HEAD WITHOUT CONTRAST 04/08/2024 10:19:48 PM TECHNIQUE: CT of the head was performed without the administration of intravenous contrast. Automated exposure control, iterative reconstruction, and/or weight based adjustment of the mA/kV was utilized to reduce the radiation dose to as low as reasonably achievable. COMPARISON: None available. CLINICAL HISTORY: fall, witnessed, on Blood thinner FINDINGS: BRAIN AND VENTRICLES: Subcortical and periventricular small vessel ischemic changes. No acute hemorrhage. No evidence of acute infarct. No hydrocephalus. No extra-axial collection. No mass effect or midline shift. ORBITS: No acute abnormality. SINUSES: No acute abnormality. SOFT TISSUES AND SKULL: Soft tissue swelling with moderate hematoma overlying the right parietal vertex (image 67). No skull fracture. IMPRESSION: 1. Soft tissue swelling with moderate hematoma overlying the right parietal vertex. 2. No acute intracranial abnormality. Electronically signed by: Pinkie Pebbles MD MD 04/08/2024 10:22 PM EST RP Workstation: [id number]  DG CHEST PORT 1 VIEW Result Date: 04/05/2024 CLINICAL DATA:  Acute on chronic heart failure. Influenza bronchitis. EXAM: PORTABLE CHEST 1 VIEW COMPARISON:  04/03/2024 FINDINGS: Mildly decreased inspiration. Stable enlarged cardiac silhouette and mildly tortuous thoracic aorta. Mild chronic interstitial prominence. Minimal left lower lobe atelectasis. Diffuse osteopenia. IMPRESSION: 1. Minimal left lower lobe atelectasis. 2. Stable cardiomegaly and mild chronic interstitial disease. Electronically Signed   By: Elspeth Bathe M.D.   On: 04/05/2024 13:45   CT Angio Chest PE W and/or Wo Contrast Result Date: 04/03/2024 EXAM: CTA CHEST 04/03/2024 06:44:08 PM TECHNIQUE: CTA of the chest was  performed without and with the administration of 75 mL of iohexol  (OMNIPAQUE ) 350 MG/ML injection. Multiplanar reformatted images are provided for review. MIP images are provided for review. Automated exposure control, iterative reconstruction, and/or weight based adjustment of the mA/kV was utilized to reduce the radiation dose to as low as reasonably achievable. COMPARISON: None available. CLINICAL HISTORY: Pulmonary embolism (PE) suspected, high prob. FINDINGS: PULMONARY ARTERIES: Pulmonary arteries are adequately opacified for evaluation. No acute pulmonary embolus. Main pulmonary artery is normal in caliber. MEDIASTINUM: The heart and pericardium demonstrate no acute abnormality. There are atherosclerotic calcifications of the aorta. LYMPH NODES: Large right hilar lymph node measuring 14 mm. No mediastinal or axillary lymphadenopathy. LUNGS AND PLEURA: There is a trace of bilateral pleural effusions, left greater than right. No pneumothorax. There is atelectasis in the lingula. There is some scarring in both lung apices. There are calcified granulomas in the right upper lobe. The lungs are otherwise clear. UPPER ABDOMEN: Limited images of the upper abdomen are unremarkable. SOFT TISSUES AND BONES: No acute bone or soft tissue abnormality. IMPRESSION: 1. No pulmonary embolism. 2. Large right hilar lymph node measuring 14 mm. 3. Trace bilateral pleural effusions, left greater than right. Electronically signed by: Greig Pique MD 04/03/2024 07:28 PM EST RP Workstation: [id number]   DG Foot Complete Left Result Date: 04/03/2024 CLINICAL DATA:  Bruising and edema of left foot. EXAM: LEFT FOOT - COMPLETE 3+ VIEW COMPARISON:  None Available. FINDINGS: There is no evidence of acute fracture or dislocation. There is no evidence of arthropathy or other focal bone abnormality. Soft tissues are unremarkable. IMPRESSION: Negative. Electronically Signed   By: Marcey Moan M.D.   On: 04/03/2024 16:45   DG Chest 2  View Result Date: 04/03/2024 CLINICAL DATA:  Lower extremity swelling. EXAM: CHEST - 2 VIEW COMPARISON:  March 24, 2024 FINDINGS: The cardiac silhouette is mildly enlarged and unchanged in size. Mild, chronic appearing increased interstitial lung markings are seen without evidence of focal consolidation or pneumothorax. A small left pleural effusion is suspected. A deformity of indeterminate age is seen along the distal right clavicle. Multilevel degenerative changes are present throughout the thoracic spine. IMPRESSION: 1. Chronic appearing increased interstitial lung markings without evidence of acute or active cardiopulmonary disease. 2. Small left pleural effusion. 3. Deformity of indeterminate age along the distal right clavicle. Correlation with physical examination is recommended to determine the presence of point tenderness. Electronically Signed   By: Suzen Dials M.D.   On: 04/03/2024 13:11   US  Venous Img Upper Uni Left (DVT) Result Date: 03/27/2024 CLINICAL DATA:  Left upper extremity edema EXAM: LEFT UPPER EXTREMITY VENOUS DOPPLER ULTRASOUND TECHNIQUE: Gray-scale sonography with graded compression, as well as color Doppler and duplex ultrasound were performed to evaluate the upper extremity deep venous system from the level of the subclavian vein and including the jugular, axillary, basilic, radial, ulnar and upper cephalic  vein. Spectral Doppler was utilized to evaluate flow at rest and with distal augmentation maneuvers. COMPARISON:  None Available. FINDINGS: Contralateral Subclavian Vein: Respiratory phasicity is normal and symmetric with the symptomatic side. No evidence of thrombus. Normal compressibility. Internal Jugular Vein: No evidence of thrombus. Normal compressibility, respiratory phasicity and response to augmentation. Subclavian Vein: No evidence of thrombus. Normal compressibility, respiratory phasicity and response to augmentation. Axillary Vein: No evidence of thrombus.  Normal compressibility, respiratory phasicity and response to augmentation. Cephalic Vein: Left cephalic vein at the antecubital fossa demonstrates ill-defined hypoechoic intraluminal thrombus and is noncompressible compatible with superficial thrombosis/thrombophlebitis. Basilic Vein: No evidence of thrombus. Normal compressibility, respiratory phasicity and response to augmentation. Brachial Veins: No evidence of thrombus. Normal compressibility, respiratory phasicity and response to augmentation. Radial Veins: No evidence of thrombus. Normal compressibility, respiratory phasicity and response to augmentation. Ulnar Veins: No evidence of thrombus. Normal compressibility, respiratory phasicity and response to augmentation. IMPRESSION: 1. Negative for left upper extremity DVT. 2. Positive for left cephalic vein superficial thrombosis/thrombophlebitis at the antecubital fossa. Electronically Signed   By: CHRISTELLA.  Shick M.D.   On: 03/27/2024 10:30   ECHOCARDIOGRAM COMPLETE Result Date: 03/25/2024    ECHOCARDIOGRAM REPORT   Patient Name:   Glenda Nguyen Date of Exam: 03/25/2024 Medical Rec #:  [id number]           Height:       67.0 in Accession #:    [id number]          Weight:       188.3 lb Date of Birth:  03-30-1934           BSA:          1.971 m Patient Age:    89 years            BP:           126/57 mmHg Patient Gender: F                   HR:           102 bpm. Exam Location:  Inpatient Procedure: 2D Echo, Cardiac Doppler and Color Doppler (Both Spectral and Color            Flow Doppler were utilized during procedure). Indications:    Atrial fibrillation  History:        Patient has no prior history of Echocardiogram examinations.                 Arrythmias:Atrial Fibrillation; Risk Factors:Hypertension.  Sonographer:    Philomena Daring Referring Phys: CLARISSA.CLIMES DAVID TAT IMPRESSIONS  1. Left ventricular ejection fraction, by estimation, is 65 to 70%. The left ventricle has normal function. The left ventricle has  no regional wall motion abnormalities. There is mild concentric left ventricular hypertrophy. Left ventricular diastolic parameters are indeterminate.  2. Right ventricular systolic function is normal. The right ventricular size is normal.  3. The mitral valve is normal in structure. No evidence of mitral valve regurgitation. No evidence of mitral stenosis.  4. The aortic valve was not well visualized. There is mild calcification of the aortic valve. Aortic valve regurgitation is mild. Aortic valve sclerosis is present, with no evidence of aortic valve stenosis.  5. The inferior vena cava is normal in size with greater than 50% respiratory variability, suggesting right atrial pressure of 3 mmHg. Comparison(s): No prior Echocardiogram. FINDINGS  Left Ventricle: Left ventricular ejection fraction, by estimation, is 65 to 70%. The left  ventricle has normal function. The left ventricle has no regional wall motion abnormalities. The left ventricular internal cavity size was normal in size. There is  mild concentric left ventricular hypertrophy. Left ventricular diastolic parameters are indeterminate. Right Ventricle: The right ventricular size is normal. No increase in right ventricular wall thickness. Right ventricular systolic function is normal. Left Atrium: Left atrial size was normal in size. Right Atrium: Right atrial size was normal in size. Pericardium: There is no evidence of pericardial effusion. Mitral Valve: The mitral valve is normal in structure. No evidence of mitral valve regurgitation. No evidence of mitral valve stenosis. Tricuspid Valve: The tricuspid valve is normal in structure. Tricuspid valve regurgitation is mild . No evidence of tricuspid stenosis. Aortic Valve: The aortic valve was not well visualized. There is mild calcification of the aortic valve. Aortic valve regurgitation is mild. Aortic regurgitation PHT measures 465 msec. Aortic valve sclerosis is present, with no evidence of aortic valve  stenosis. Pulmonic Valve: The pulmonic valve was normal in structure. Pulmonic valve regurgitation is mild. No evidence of pulmonic stenosis. Aorta: The aortic root and ascending aorta are structurally normal, with no evidence of dilitation. Venous: The inferior vena cava is normal in size with greater than 50% respiratory variability, suggesting right atrial pressure of 3 mmHg. IAS/Shunts: No atrial level shunt detected by color flow Doppler.  LEFT VENTRICLE PLAX 2D LVIDd:         3.90 cm   Diastology LVIDs:         2.60 cm   LV e' medial:    10.23 cm/s LV PW:         1.10 cm   LV E/e' medial:  9.5 LV IVS:        1.10 cm   LV e' lateral:   14.80 cm/s LVOT diam:     1.80 cm   LV E/e' lateral: 6.5 LV SV:         42 LV SV Index:   22 LVOT Area:     2.54 cm  RIGHT VENTRICLE             IVC RV Basal diam:  2.90 cm     IVC diam: 1.40 cm RV Mid diam:    2.20 cm RV S prime:     14.00 cm/s TAPSE (M-mode): 2.2 cm LEFT ATRIUM             Index        RIGHT ATRIUM           Index LA diam:        3.30 cm 1.67 cm/m   RA Area:     15.70 cm LA Vol (A2C):   46.8 ml 23.74 ml/m  RA Volume:   37.00 ml  18.77 ml/m LA Vol (A4C):   42.7 ml 21.66 ml/m LA Biplane Vol: 48.7 ml 24.71 ml/m  AORTIC VALVE LVOT Vmax:   129.33 cm/s LVOT Vmean:  83.367 cm/s LVOT VTI:    0.167 m AI PHT:      465 msec  AORTA Ao Root diam: 3.20 cm Ao Asc diam:  3.20 cm MITRAL VALVE               TRICUSPID VALVE MV Area (PHT): 4.67 cm    TR Peak grad:   30.9 mmHg MV Decel Time: 163 msec    TR Vmax:        278.00 cm/s MV E velocity: 96.90 cm/s  SHUNTS                            Systemic VTI:  0.17 m                            Systemic Diam: 1.80 cm Stanly Leavens MD Electronically signed by Stanly Leavens MD Signature Date/Time: 03/25/2024/12:13:57 PM    Final    DG Chest 1 View Result Date: 03/24/2024 CLINICAL DATA:  Weakness and fall. EXAM: CHEST  1 VIEW COMPARISON:  None available. FINDINGS: The heart is mildly  enlarged. No pulmonary vascular congestion. Mediastinal silhouette within normal limits. Lungs are clear. Deformity of the lateral RIGHT clavicle suspicious for fracture. IMPRESSION: 1. Mild cardiomegaly. 2. Deformity of the lateral RIGHT clavicle suspicious for fracture. Please correlate for focal tenderness. Electronically Signed   By: Aliene Lloyd M.D.   On: 03/24/2024 14:04   DG Knee Complete 4 Views Right Result Date: 03/24/2024 EXAM: 4 VIEW(S) XRAY OF THE KNEE 03/24/2024 11:07:00 AM COMPARISON: None available. CLINICAL HISTORY: fall FINDINGS: BONES AND JOINTS: No acute fracture. No malalignment. No significant joint effusion. Mild lateral compartment joint space narrowing. Marginal osteophytosis and subchondral sclerosis of the lateral compartment. Additional subtle marginal osteophytosis of the medial compartment. SOFT TISSUES: Mild diffuse soft tissue swelling. IMPRESSION: 1. No acute fracture or dislocation. 2. Mild diffuse soft tissue swelling. 3. Lateral compartment predominant osteoarthritis. Electronically signed by: Donnice Mania MD 03/24/2024 11:27 AM EST RP Workstation: [id number]   CT Cervical Spine Wo Contrast Result Date: 03/24/2024 EXAM: CT CERVICAL SPINE WITHOUT CONTRAST 03/24/2024 10:59:54 AM TECHNIQUE: CT of the cervical spine was performed without the administration of intravenous contrast. Multiplanar reformatted images are provided for review. Automated exposure control, iterative reconstruction, and/or weight based adjustment of the mA/kV was utilized to reduce the radiation dose to as low as reasonably achievable. COMPARISON: None available. CLINICAL HISTORY: Neck trauma (Age >= 65y) FINDINGS: BONES AND ALIGNMENT: Straightening of the normal cervical lordosis. Trace degenerative anterolisthesis of C2 on C3 and C3 on C4. Additional trace degenerative anterolisthesis of C7 on T1. No acute fracture or traumatic malalignment. DEGENERATIVE CHANGES: Moderate disc space narrowing at  C4-C5. Additional disc space narrowing at C5-C6 and C6-C7. Mild degenerative endplate osteophytes at multiple levels. There are disc osteophyte complexes at multiple levels without evidence of high grade osseous spinal canal stenosis. Facet arthrosis and uncovertebral hypertrophy at multiple levels. SOFT TISSUES: No prevertebral soft tissue swelling. LUNGS: Scarring and atelectasis in the right lung apex. IMPRESSION: 1. No evidence of acute traumatic injury. 2. Degenerative changes as above. Electronically signed by: Donnice Mania MD 03/24/2024 11:25 AM EST RP Workstation: [id number]   CT Head Wo Contrast Result Date: 03/24/2024 EXAM: CT HEAD WITHOUT CONTRAST 03/24/2024 10:59:54 AM TECHNIQUE: CT of the head was performed without the administration of intravenous contrast. Automated exposure control, iterative reconstruction, and/or weight based adjustment of the mA/kV was utilized to reduce the radiation dose to as low as reasonably achievable. COMPARISON: None available. CLINICAL HISTORY: Head trauma, minor (Age >= 65y). FINDINGS: BRAIN AND VENTRICLES: No acute hemorrhage. 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 Mild chronic microvascular ischemic change. No evidence of acute infarct. No hydrocephalus. No extra-axial collection. No mass effect or midline shift. ORBITS: Bilateral lens replacement noted. SINUSES: Trace right mastoid effusion. SOFT TISSUES AND SKULL: Right periorbital soft tissue swelling. No  skull fracture. IMPRESSION: 1. No acute intracranial abnormality. 2. Right periorbital soft tissue swelling. 3. Focal encephalomalacia along the anterolateral left temporal lobe, suggestive of small remote infarct or sequelae of prior trauma. Electronically signed by: Donnice Mania MD 03/24/2024 11:18 AM EST RP Workstation: [id number]     Discharge Exam: Vitals:   04/13/24 2003 04/14/24 0401  BP: 107/65 117/67  Pulse:  97 91  Resp: 14 16  Temp: 98 F (36.7 C) 98.2 F (36.8 C)  SpO2: 96% 96%   Vitals:   04/13/24 1459 04/13/24 2003 04/14/24 0401 04/14/24 0403  BP: 106/70 107/65 117/67   Pulse: 82 97 91   Resp:  14 16   Temp: 98.4 F (36.9 C) 98 F (36.7 C) 98.2 F (36.8 C)   TempSrc: Oral Oral Oral   SpO2: 96% 96% 96%   Weight:    61.7 kg  Height:        General: Pt is alert, awake, not in acute distress Cardiovascular: RRR, S1/S2 +, no rubs, no gallops Respiratory: CTA bilaterally, no wheezing, no rhonchi Abdominal: Soft, NT, ND, bowel sounds + Extremities: no edema, no cyanosis    The results of significant diagnostics from this hospitalization (including imaging, microbiology, ancillary and laboratory) are listed below for reference.     Microbiology: Recent Results (from the past 240 hours)  Expectorated Sputum Assessment w Gram Stain, Rflx to Resp Cult     Status: None   Collection Time: 04/04/24  4:30 PM   Specimen: Sputum  Result Value Ref Range Status   Specimen Description SPUTUM  Final   Special Requests Normal  Final   Sputum evaluation   Final    THIS SPECIMEN IS ACCEPTABLE FOR SPUTUM CULTURE Performed at [facility], [address], [place], [place] [address]    Report Status 04/04/2024 FINAL  Final  Culture, Respiratory w Gram Stain     Status: None   Collection Time: 04/04/24  4:30 PM   Specimen: SPU  Result Value Ref Range Status   Specimen Description   Final    SPUTUM Performed at [facility], [address], [place], [place] [address]    Special Requests   Final    Normal Reflexed from [id number] Performed at [facility], [address] [place], [place] [address]    Gram Stain   Final    ABUNDANT SQUAMOUS EPITHELIAL CELLS PRESENT NO WBC SEEN FEW GRAM POSITIVE RODS RARE YEAST RARE GRAM POSITIVE COCCI RARE GRAM NEGATIVE RODS    Culture   Final    FEW Normal respiratory flora-no Staph aureus or Pseudomonas seen Performed at [facility], [address], [place], [place] [address]    Report Status 04/08/2024 FINAL  Final  Resp panel by RT-PCR (RSV, Flu A&B, Covid) Nasal Mucosa     Status: Abnormal   Collection Time: 04/04/24  6:34 PM   Specimen: Nasal Mucosa; Nasal Swab  Result Value Ref Range Status   SARS Coronavirus 2 by RT PCR NEGATIVE NEGATIVE Final    Comment: (NOTE) SARS-CoV-2 target nucleic acids are NOT DETECTED.  The SARS-CoV-2 RNA is generally detectable in upper respiratory specimens during the acute phase of infection. The lowest concentration of SARS-CoV-2 viral copies this assay can detect is 138 copies/mL. A negative result does not preclude SARS-Cov-2 infection and should not be used as the sole basis for treatment or other patient management decisions. A negative result may occur with  improper specimen collection/handling, submission of specimen other than  nasopharyngeal swab, presence of viral mutation(s) within the areas targeted by this assay, and inadequate number of viral copies(<138 copies/mL). A negative result must be combined with clinical observations, patient history, and epidemiological information. The expected result is Negative.  Fact Sheet for Patients:  bloggercourse.com  Fact Sheet for Healthcare Providers:  seriousbroker.it  This test is no t yet approved or cleared by the [place]  FDA and  has been authorized for detection and/or diagnosis of SARS-CoV-2 by FDA under an Emergency Use Authorization (EUA). This EUA will remain  in effect (meaning this test can be used) for the duration of the COVID-19 declaration under Section 564(b)(1) of the Act, 21 U.S.C.section 360bbb-3(b)(1), unless the authorization is terminated  or revoked sooner.       Influenza A by PCR POSITIVE (A) NEGATIVE Final   Influenza B by PCR NEGATIVE NEGATIVE Final    Comment: (NOTE) The Xpert Xpress SARS-CoV-2/FLU/RSV plus assay is intended as an aid in the  diagnosis of influenza from Nasopharyngeal swab specimens and should not be used as a sole basis for treatment. Nasal washings and aspirates are unacceptable for Xpert Xpress SARS-CoV-2/FLU/RSV testing.  Fact Sheet for Patients: bloggercourse.com  Fact Sheet for Healthcare Providers: seriousbroker.it  This test is not yet approved or cleared by the [place]  FDA and has been authorized for detection and/or diagnosis of SARS-CoV-2 by FDA under an Emergency Use Authorization (EUA). This EUA will remain in effect (meaning this test can be used) for the duration of the COVID-19 declaration under Section 564(b)(1) of the Act, 21 U.S.C. section 360bbb-3(b)(1), unless the authorization is terminated or revoked.     Resp Syncytial Virus by PCR NEGATIVE NEGATIVE Final    Comment: (NOTE) Fact Sheet for Patients: bloggercourse.com  Fact Sheet for Healthcare Providers: seriousbroker.it  This test is not yet approved or cleared by the [place]  FDA and has been authorized for detection and/or diagnosis of SARS-CoV-2 by FDA under an Emergency Use Authorization (EUA). This EUA will remain in effect (meaning this test can be used) for the duration of the COVID-19 declaration under Section 564(b)(1) of the Act, 21 U.S.C. section 360bbb-3(b)(1), unless the authorization is terminated or revoked.  Performed at [facility], [address], [place], [place] [address]   Respiratory (~20 pathogens) panel by PCR     Status: Abnormal   Collection Time: 04/04/24  6:34 PM   Specimen: Nasopharyngeal Swab; Respiratory  Result Value Ref Range Status   Adenovirus NOT DETECTED NOT DETECTED Final   Coronavirus 229E NOT DETECTED NOT DETECTED Final    Comment: (NOTE) The Coronavirus on the Respiratory Panel, DOES NOT test for the novel  Coronavirus (2019 nCoV)    Coronavirus HKU1 NOT DETECTED NOT  DETECTED Final   Coronavirus NL63 NOT DETECTED NOT DETECTED Final   Coronavirus OC43 NOT DETECTED NOT DETECTED Final   Metapneumovirus NOT DETECTED NOT DETECTED Final   Rhinovirus / Enterovirus NOT DETECTED NOT DETECTED Final   Influenza A H3 DETECTED (A) NOT DETECTED Final   Influenza B NOT DETECTED NOT DETECTED Final   Parainfluenza Virus 1 NOT DETECTED NOT DETECTED Final   Parainfluenza Virus 2 NOT DETECTED NOT DETECTED Final   Parainfluenza Virus 3 NOT DETECTED NOT DETECTED Final   Parainfluenza Virus 4 NOT DETECTED NOT DETECTED Final   Respiratory Syncytial Virus NOT DETECTED NOT DETECTED Final   Bordetella pertussis NOT DETECTED NOT DETECTED Final   Bordetella Parapertussis NOT DETECTED NOT DETECTED Final   Chlamydophila pneumoniae NOT DETECTED NOT  DETECTED Final   Mycoplasma pneumoniae NOT DETECTED NOT DETECTED Final    Comment: Performed at [facility], [address], [place], [place] [address]  MRSA Next Gen by PCR, Nasal     Status: None   Collection Time: 04/04/24  6:34 PM   Specimen: Nasal Mucosa; Nasal Swab  Result Value Ref Range Status   MRSA by PCR Next Gen NOT DETECTED NOT DETECTED Final    Comment: (NOTE) The GeneXpert MRSA Assay (FDA approved for NASAL specimens only), is one component of a comprehensive MRSA colonization surveillance program. It is not intended to diagnose MRSA infection nor to guide or monitor treatment for MRSA infections. Test performance is not FDA approved in patients less than 67 years old. Performed at [facility], [address] [place], [place] [address]      Labs: BNP (last 3 results) No results for input(s): BNP in the last 8760 hours. Basic Metabolic Panel: Recent Labs  Lab 04/10/24 0547 04/11/24 0417 04/12/24 0423 04/13/24 0414 04/14/24 0421  NA 130* 131* 129* 129* 130*  K 3.8 3.7 3.4* 4.2 4.0  CL 91* 92* 89* 92* 92*  CO2 32 28 28 26 25   GLUCOSE 92 88 83 81 93  BUN 17 15 16 17 22   CREATININE 0.81 0.80 0.94  0.96 1.11*  CALCIUM 8.9 8.8* 8.5* 8.8* 8.8*  MG 1.9 2.2 1.9 2.0 2.1   Liver Function Tests: Recent Labs  Lab 04/12/24 0423  AST 19  ALT 14  ALKPHOS 82  BILITOT 0.3  PROT 6.3*  ALBUMIN 3.2*   No results for input(s): LIPASE, AMYLASE in the last 168 hours. No results for input(s): AMMONIA in the last 168 hours. CBC: Recent Labs  Lab 04/09/24 0118 04/12/24 0423 04/13/24 0414  WBC 11.2* 6.0 5.9  HGB 12.3 10.6* 10.8*  HCT 35.9* 31.7* 32.3*  MCV 88.4 89.3 89.7  PLT 449* 384 408*   Cardiac Enzymes: No results for input(s): CKTOTAL, CKMB, CKMBINDEX, TROPONINI in the last 168 hours. BNP: Invalid input(s): POCBNP CBG: Recent Labs  Lab 04/09/24 0109  GLUCAP 117*   D-Dimer No results for input(s): DDIMER in the last 72 hours. Hgb A1c No results for input(s): HGBA1C in the last 72 hours. Lipid Profile No results for input(s): CHOL, HDL, LDLCALC, TRIG, CHOLHDL, LDLDIRECT in the last 72 hours. Thyroid function studies No results for input(s): TSH, T4TOTAL, T3FREE, THYROIDAB in the last 72 hours.  Invalid input(s): FREET3 Anemia work up No results for input(s): VITAMINB12, FOLATE, FERRITIN, TIBC, IRON, RETICCTPCT in the last 72 hours. Urinalysis    Component Value Date/Time   COLORURINE YELLOW 03/24/2024 1038   APPEARANCEUR CLEAR 03/24/2024 1038   LABSPEC 1.020 03/24/2024 1038   PHURINE 6.0 03/24/2024 1038   GLUCOSEU NEGATIVE 03/24/2024 1038   HGBUR MODERATE (A) 03/24/2024 1038   BILIRUBINUR NEGATIVE 03/24/2024 1038   KETONESUR 5 (A) 03/24/2024 1038   PROTEINUR 30 (A) 03/24/2024 1038   NITRITE NEGATIVE 03/24/2024 1038   LEUKOCYTESUR NEGATIVE 03/24/2024 1038   Sepsis Labs Recent Labs  Lab 04/09/24 0118 04/12/24 0423 04/13/24 0414  WBC 11.2* 6.0 5.9   Microbiology Recent Results (from the past 240 hours)  Expectorated Sputum Assessment w Gram Stain, Rflx to Resp Cult     Status: None   Collection Time:  04/04/24  4:30 PM   Specimen: Sputum  Result Value Ref Range Status   Specimen Description SPUTUM  Final   Special Requests Normal  Final   Sputum evaluation  Final    THIS SPECIMEN IS ACCEPTABLE FOR SPUTUM CULTURE Performed at [facility], [address] [place], [place] [address]    Report Status 04/04/2024 FINAL  Final  Culture, Respiratory w Gram Stain     Status: None   Collection Time: 04/04/24  4:30 PM   Specimen: SPU  Result Value Ref Range Status   Specimen Description   Final    SPUTUM Performed at [facility], [address], [place], [place] [address]    Special Requests   Final    Normal Reflexed from [phone] Performed at [facility], [address] [place], [place] [address]    Gram Stain   Final    ABUNDANT SQUAMOUS EPITHELIAL CELLS PRESENT NO WBC SEEN FEW GRAM POSITIVE RODS RARE YEAST RARE GRAM POSITIVE COCCI RARE GRAM NEGATIVE RODS    Culture   Final    FEW Normal respiratory flora-no Staph aureus or Pseudomonas seen Performed at [facility], [address], [place], [place] [address]    Report Status 04/08/2024 FINAL  Final  Resp panel by RT-PCR (RSV, Flu A&B, Covid) Nasal Mucosa     Status: Abnormal   Collection Time: 04/04/24  6:34 PM   Specimen: Nasal Mucosa; Nasal Swab  Result Value Ref Range Status   SARS Coronavirus 2 by RT PCR NEGATIVE NEGATIVE Final    Comment: (NOTE) SARS-CoV-2 target nucleic acids are NOT DETECTED.  The SARS-CoV-2 RNA is generally detectable in upper respiratory specimens during the acute phase of infection. The lowest concentration of SARS-CoV-2 viral copies this assay can detect is 138 copies/mL. A negative result does not preclude SARS-Cov-2 infection and should not be used as the sole basis for treatment or other patient management decisions. A negative result may occur with  improper specimen collection/handling, submission of specimen other than nasopharyngeal swab, presence of viral mutation(s) within  the areas targeted by this assay, and inadequate number of viral copies(<138 copies/mL). A negative result must be combined with clinical observations, patient history, and epidemiological information. The expected result is Negative.  Fact Sheet for Patients:  bloggercourse.com  Fact Sheet for Healthcare Providers:  seriousbroker.it  This test is no t yet approved or cleared by the [place]  FDA and  has been authorized for detection and/or diagnosis of SARS-CoV-2 by FDA under an Emergency Use Authorization (EUA). This EUA will remain  in effect (meaning this test can be used) for the duration of the COVID-19 declaration under Section 564(b)(1) of the Act, 21 U.S.C.section 360bbb-3(b)(1), unless the authorization is terminated  or revoked sooner.       Influenza A by PCR POSITIVE (A) NEGATIVE Final   Influenza B by PCR NEGATIVE NEGATIVE Final    Comment: (NOTE) The Xpert Xpress SARS-CoV-2/FLU/RSV plus assay is intended as an aid in the diagnosis of influenza from Nasopharyngeal swab specimens and should not be used as a sole basis for treatment. Nasal washings and aspirates are unacceptable for Xpert Xpress SARS-CoV-2/FLU/RSV testing.  Fact Sheet for Patients: bloggercourse.com  Fact Sheet for Healthcare Providers: seriousbroker.it  This test is not yet approved or cleared by the [place]  FDA and has been authorized for detection and/or diagnosis of SARS-CoV-2 by FDA under an Emergency Use Authorization (EUA). This EUA will remain in effect (meaning this test can be used) for the duration of the COVID-19 declaration under Section 564(b)(1) of the Act, 21 U.S.C. section 360bbb-3(b)(1), unless the authorization is terminated or revoked.     Resp Syncytial Virus by  PCR NEGATIVE NEGATIVE Final    Comment: (NOTE) Fact Sheet for  Patients: bloggercourse.com  Fact Sheet for Healthcare Providers: seriousbroker.it  This test is not yet approved or cleared by the [place]  FDA and has been authorized for detection and/or diagnosis of SARS-CoV-2 by FDA under an Emergency Use Authorization (EUA). This EUA will remain in effect (meaning this test can be used) for the duration of the COVID-19 declaration under Section 564(b)(1) of the Act, 21 U.S.C. section 360bbb-3(b)(1), unless the authorization is terminated or revoked.  Performed at [facility], [address] [place], [place] [address]   Respiratory (~20 pathogens) panel by PCR     Status: Abnormal   Collection Time: 04/04/24  6:34 PM   Specimen: Nasopharyngeal Swab; Respiratory  Result Value Ref Range Status   Adenovirus NOT DETECTED NOT DETECTED Final   Coronavirus 229E NOT DETECTED NOT DETECTED Final    Comment: (NOTE) The Coronavirus on the Respiratory Panel, DOES NOT test for the novel  Coronavirus (2019 nCoV)    Coronavirus HKU1 NOT DETECTED NOT DETECTED Final   Coronavirus NL63 NOT DETECTED NOT DETECTED Final   Coronavirus OC43 NOT DETECTED NOT DETECTED Final   Metapneumovirus NOT DETECTED NOT DETECTED Final   Rhinovirus / Enterovirus NOT DETECTED NOT DETECTED Final   Influenza A H3 DETECTED (A) NOT DETECTED Final   Influenza B NOT DETECTED NOT DETECTED Final   Parainfluenza Virus 1 NOT DETECTED NOT DETECTED Final   Parainfluenza Virus 2 NOT DETECTED NOT DETECTED Final   Parainfluenza Virus 3 NOT DETECTED NOT DETECTED Final   Parainfluenza Virus 4 NOT DETECTED NOT DETECTED Final   Respiratory Syncytial Virus NOT DETECTED NOT DETECTED Final   Bordetella pertussis NOT DETECTED NOT DETECTED Final   Bordetella Parapertussis NOT DETECTED NOT DETECTED Final   Chlamydophila pneumoniae NOT DETECTED NOT DETECTED Final   Mycoplasma pneumoniae NOT DETECTED NOT DETECTED Final    Comment: Performed at  [facility], [address] [place], [place] [address]  MRSA Next Gen by PCR, Nasal     Status: None   Collection Time: 04/04/24  6:34 PM   Specimen: Nasal Mucosa; Nasal Swab  Result Value Ref Range Status   MRSA by PCR Next Gen NOT DETECTED NOT DETECTED Final    Comment: (NOTE) The GeneXpert MRSA Assay (FDA approved for NASAL specimens only), is one component of a comprehensive MRSA colonization surveillance program. It is not intended to diagnose MRSA infection nor to guide or monitor treatment for MRSA infections. Test performance is not FDA approved in patients less than 18 years old. Performed at [facility], [address], [place], [place] [address]      Time coordinating discharge: 35 minutes  SIGNED:   Adron JONETTA Fairly, DO [facility] 04/14/2024, 10:10 AM  If 7PM-7AM, please contact night-coverage [email or web address]     [1]  Allergies Allergen Reactions   Penicillins Rash

## 2024-05-02 ENCOUNTER — Ambulatory Visit: Admitting: Physician Assistant

## 2024-05-02 DIAGNOSIS — I4891 Unspecified atrial fibrillation: Secondary | ICD-10-CM

## 2024-05-02 DIAGNOSIS — E222 Syndrome of inappropriate secretion of antidiuretic hormone: Secondary | ICD-10-CM

## 2024-05-02 DIAGNOSIS — I10 Essential (primary) hypertension: Secondary | ICD-10-CM

## 2024-05-02 DIAGNOSIS — J09X2 Influenza due to identified novel influenza A virus with other respiratory manifestations: Secondary | ICD-10-CM

## 2024-05-02 DIAGNOSIS — I5031 Acute diastolic (congestive) heart failure: Secondary | ICD-10-CM

## 2024-05-02 DIAGNOSIS — J111 Influenza due to unidentified influenza virus with other respiratory manifestations: Secondary | ICD-10-CM

## 2024-05-02 DIAGNOSIS — E871 Hypo-osmolality and hyponatremia: Secondary | ICD-10-CM
